# Patient Record
Sex: Male | Born: 1935 | Race: White | Hispanic: Yes | Marital: Married | Smoking: Never smoker
Health system: Southern US, Community
[De-identification: ages and names within clinical notes are randomized; demographics above are authoritative.]

## PROBLEM LIST (undated history)

## (undated) DIAGNOSIS — C801 Malignant (primary) neoplasm, unspecified: Secondary | ICD-10-CM

## (undated) DIAGNOSIS — G459 Transient cerebral ischemic attack, unspecified: Secondary | ICD-10-CM

## (undated) DIAGNOSIS — J189 Pneumonia, unspecified organism: Secondary | ICD-10-CM

## (undated) DIAGNOSIS — I4891 Unspecified atrial fibrillation: Secondary | ICD-10-CM

## (undated) DIAGNOSIS — I10 Essential (primary) hypertension: Secondary | ICD-10-CM

## (undated) DIAGNOSIS — T8859XA Other complications of anesthesia, initial encounter: Secondary | ICD-10-CM

## (undated) DIAGNOSIS — A3790 Whooping cough, unspecified species without pneumonia: Secondary | ICD-10-CM

## (undated) DIAGNOSIS — D649 Anemia, unspecified: Secondary | ICD-10-CM

## (undated) HISTORY — PX: CHOLECYSTECTOMY: SHX55

## (undated) HISTORY — PX: HERNIA REPAIR: SHX51

## (undated) HISTORY — PX: EYE SURGERY: SHX253

## (undated) HISTORY — PX: BLADDER SURGERY: SHX569

## (undated) HISTORY — PX: TONSILLECTOMY: SUR1361

---

## 2007-02-13 ENCOUNTER — Emergency Department (HOSPITAL_COMMUNITY): Admission: EM | Admit: 2007-02-13 | Discharge: 2007-02-13 | Payer: Self-pay | Admitting: Emergency Medicine

## 2007-10-06 ENCOUNTER — Emergency Department (HOSPITAL_COMMUNITY): Admission: EM | Admit: 2007-10-06 | Discharge: 2007-10-06 | Payer: Self-pay | Admitting: Emergency Medicine

## 2007-10-21 ENCOUNTER — Encounter: Payer: Self-pay | Admitting: Urology

## 2007-10-21 ENCOUNTER — Ambulatory Visit (HOSPITAL_COMMUNITY): Admission: RE | Admit: 2007-10-21 | Discharge: 2007-10-22 | Payer: Self-pay | Admitting: Urology

## 2008-10-18 IMAGING — CR DG CHEST 2V
2 series · 2 of 2 positions shown · non-contrast
Comparison: None.

CLINICAL DATA: Preop bladder tumor.  Cough. 
 CHEST ? 2 VIEW:

[w chest pa *]
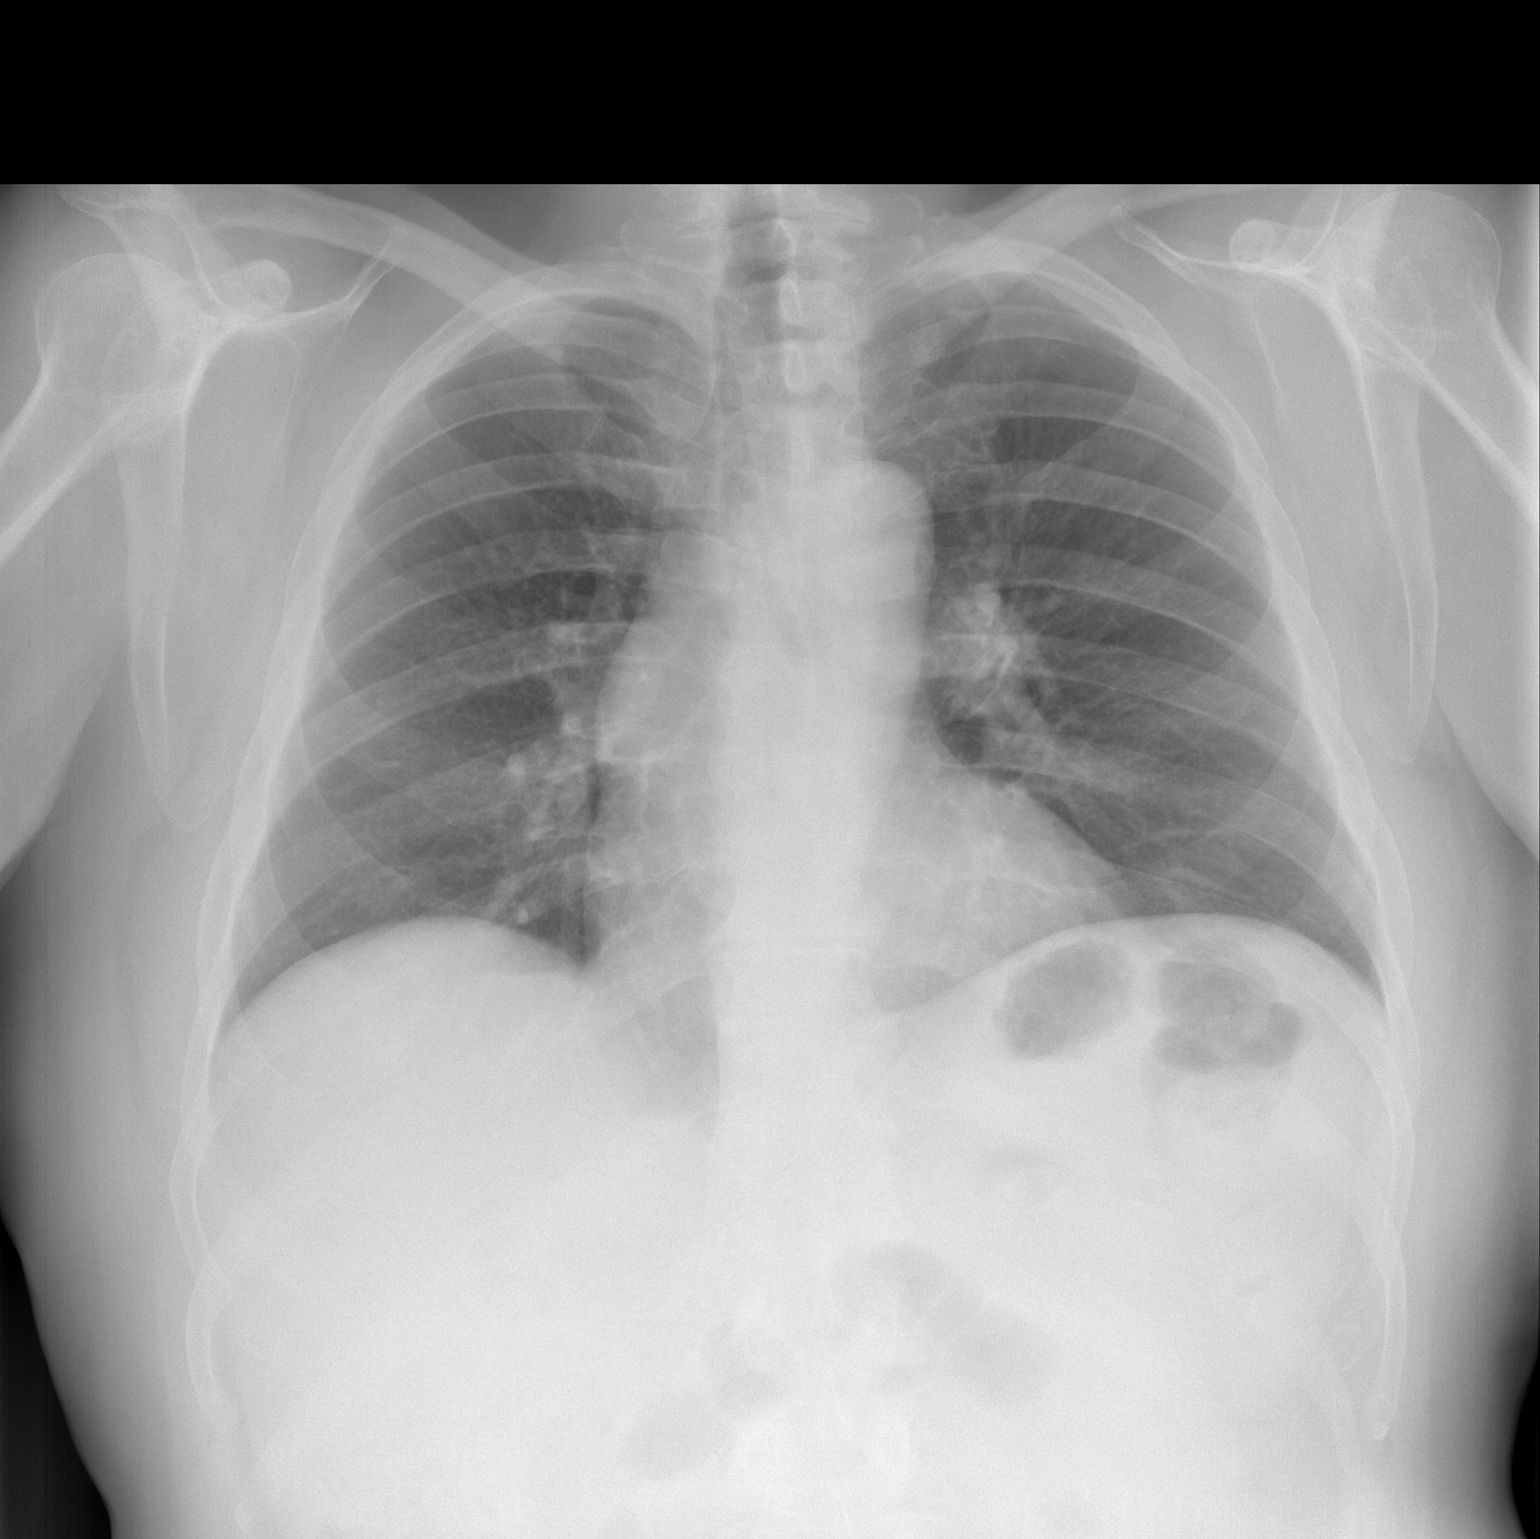

[w chest lat *]
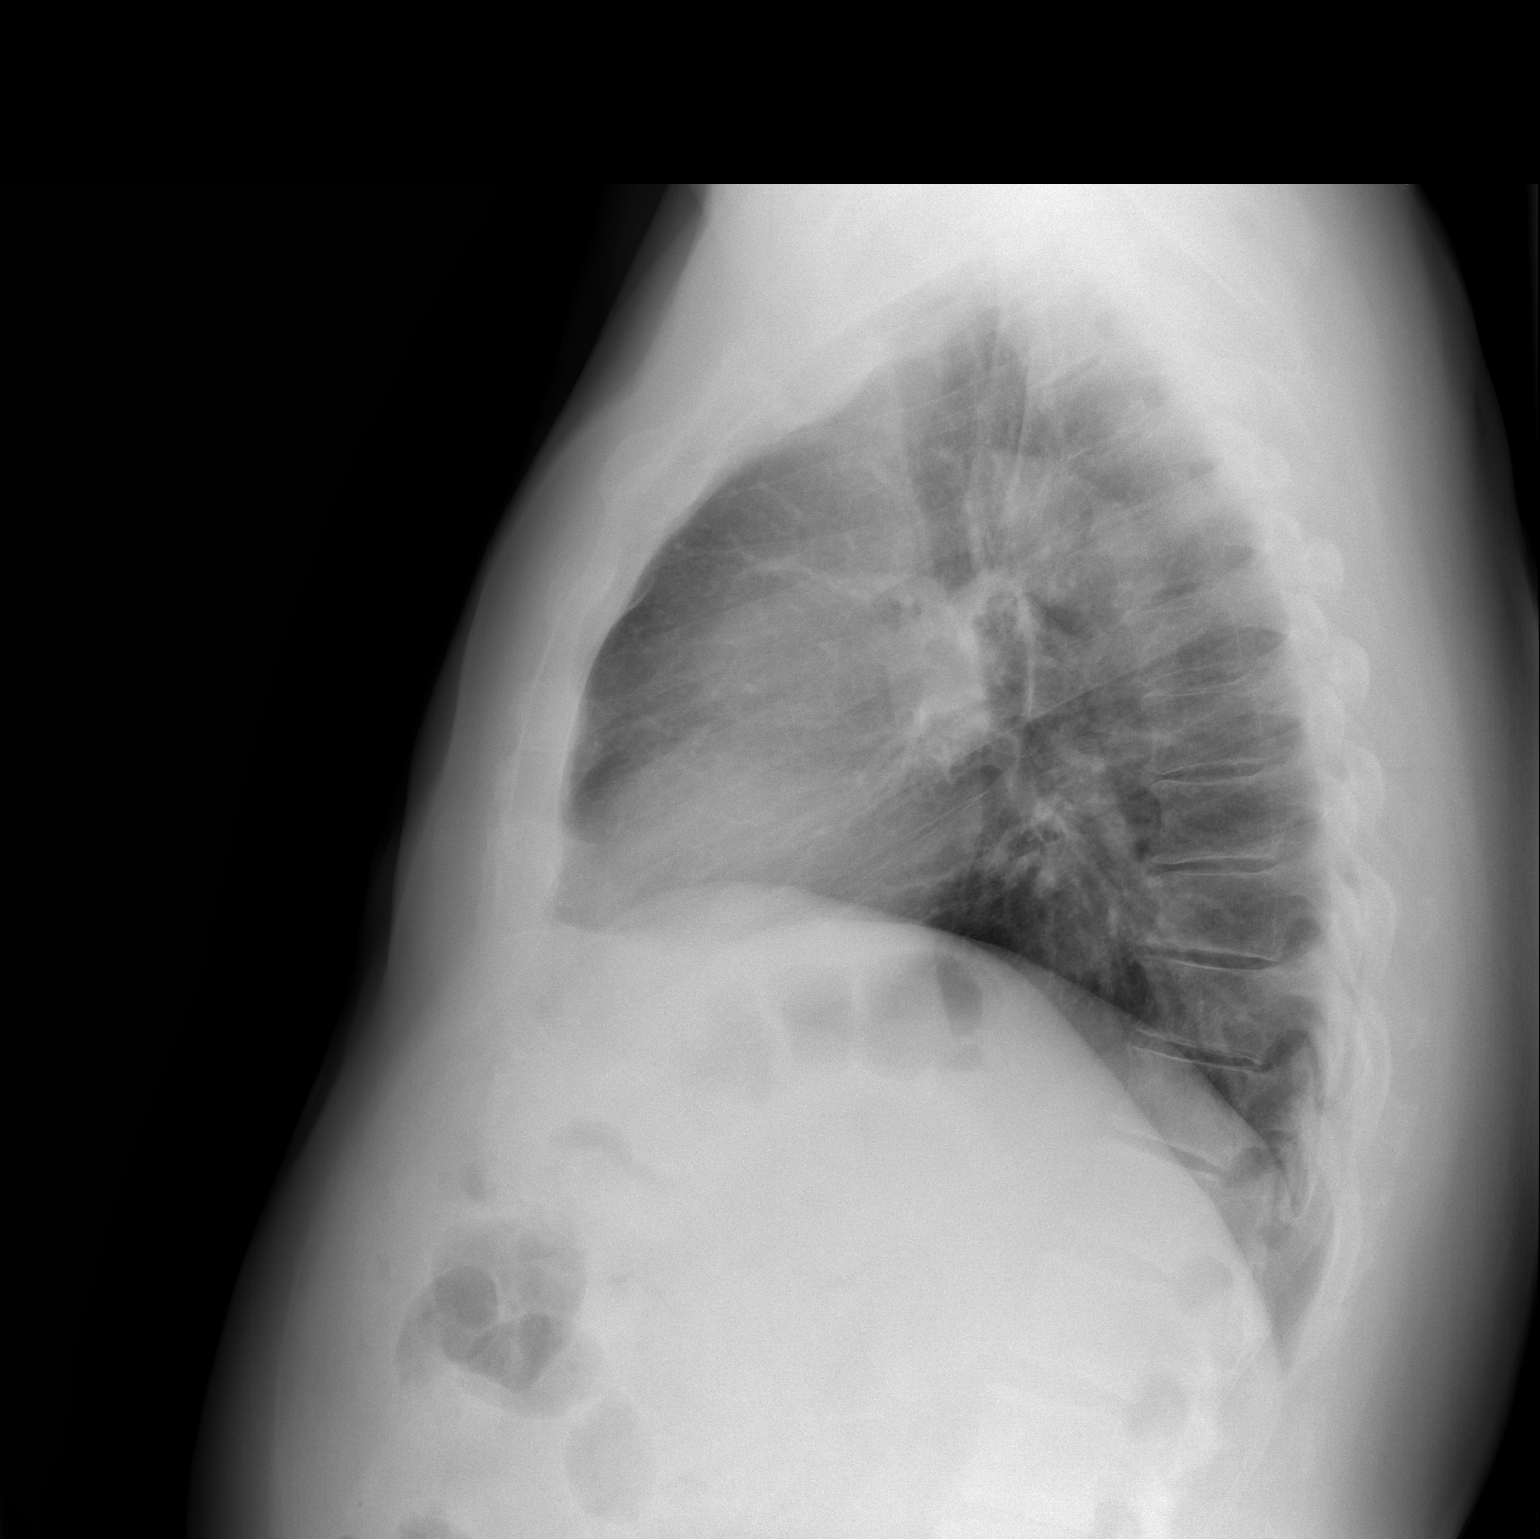

[2 of 2 positions shown; findings below may reference images not displayed]

FINDINGS: Trachea is midline.  Heart is within normal limits for size.  Lungs are low in volume with left basilar atelectasis.  No pleural fluid.
IMPRESSION: Low lung volumes with left basilar atelectasis.

## 2010-09-16 ENCOUNTER — Encounter: Payer: Self-pay | Admitting: Urology

## 2011-01-08 NOTE — Op Note (Signed)
NAMEBAYLER, Stephen Hendrix              ACCOUNT NO.:  [id number]   MEDICAL RECORD NO.:  [id number]          PATIENT TYPE:  OIB   LOCATION:  1419                         FACILITY:  [facility]   PHYSICIAN:  Bertram Millard. Dahlstedt, M.D.DATE OF BIRTH:  July 22, 1929   DATE OF PROCEDURE:  10/21/2007  DATE OF DISCHARGE:                               OPERATIVE REPORT   PREOPERATIVE DIAGNOSIS:  Bladder tumors.   POSTOPERATIVE DIAGNOSIS:  Bladder tumors.   PROCEDURE:  Anesthetic cystoscopy, transurethral resection of bladder  tumor of 2 cm tumor.   SURGEON:  Bertram Millard. Dahlstedt, M.D.   ANESTHESIA:  Subarachnoid block.   COMPLICATIONS:  None   BRIEF HISTORY:  This 77 year old gentleman recently presented to my  office with gross hematuria.  He was at first in clot retention.  Hematuria has been intermittent for a few years.  He has a history of a  TURP quite a few years ago.  Evaluation and management included clot  evacuation and eventual cystoscopy which revealed three bladder tumors -  two around his right ureteral orifice and one in the mid trigone in the  anteureteric ridge.  He presents at this time for anesthetic cystoscopy  and TURBT.  The risks and complications of this procedure have been  discussed with the patient.  He understands and desires to proceed.   DESCRIPTION OF PROCEDURE:  The patient was identified in the holding  area.  He was administered preoperative IV antibiotics.  He was taken to  the operating room where a subarachnoid block was performed.  He was  then placed in the dorsal lithotomy position.  His genitalia and  perineum were prepped and draped.  A time out was then called.  The  patient was identified, the procedure and position were identified as  well as the patient's operative position.  His urethra was then  calibrated with a 30-French with [employer] sounds.  A 28-French  resectoscope sheath was then placed through the obturator.  The  resectoscope element was  then placed.  Inspection of the bladder was  carried out directly.  The dome, anterior, and lateral walls of the  bladder were free of tumor.  There was one 5 mm tumor in the  interureteric ridge and two bladder tumors, one approximately 1.5 cm and  one smaller than this just lateral and posterior to the right ureteral  orifice.  The orifice was found to be normal.  The cutting loop was used  to resect all of these tumors down to the muscular layer.  They appeared  to be quite papillary and superficial.  Following resection of these  tumors, the base of the tumors were then coagulated with the cautery  current.  No bleeding was seen.  The bladder tumors were then evacuated  from the bladder.  Inspection of the bladder again revealed no bleeding  and no more tumors.  At this point, the scope was removed and a 22  [place]  Foley catheter placed using the stylet.  Clear urine was seen.  The  balloon was filled with 10 mL of water.  This was  hooked to dependent  drainage.  At this point, the patient was put on the stretcher and taken  to the PACU.  He tolerated procedure well.      Bertram Millard. Dahlstedt, M.D.  Electronically Signed     SMD/MEDQ  D:  10/21/2007  T:  10/21/2007  Job:  [id number]

## 2011-01-17 ENCOUNTER — Ambulatory Visit: Payer: Self-pay | Admitting: Pain Medicine

## 2011-02-14 ENCOUNTER — Ambulatory Visit: Payer: Self-pay

## 2011-05-17 LAB — I-STAT 8, (EC8 V) (CONVERTED LAB)
Acid-base deficit: 3 — ABNORMAL HIGH
BUN: 23
Chloride: 103
HCT: 50
Operator id: 277751
Potassium: 4.6
pCO2, Ven: 38.6 — ABNORMAL LOW

## 2011-05-17 LAB — POCT I-STAT CREATININE
Creatinine, Ser: 1.2
Operator id: 277751

## 2011-05-17 LAB — DIFFERENTIAL
Basophils Relative: 1
Eosinophils Absolute: 0.2
Lymphs Abs: 2
Monocytes Absolute: 0.8
Monocytes Relative: 8
Neutro Abs: 7.6
Neutrophils Relative %: 71

## 2011-05-17 LAB — URINALYSIS, ROUTINE W REFLEX MICROSCOPIC
Nitrite: POSITIVE — AB
Specific Gravity, Urine: 1.012
Urobilinogen, UA: 0.2
pH: 5

## 2011-05-17 LAB — PROTIME-INR: INR: 1

## 2011-05-17 LAB — CBC
Hemoglobin: 16.8
MCHC: 34.6
MCV: 92
RBC: 5.27
WBC: 10.7 — ABNORMAL HIGH

## 2011-05-17 LAB — URINE MICROSCOPIC-ADD ON

## 2011-06-12 LAB — POCT CARDIAC MARKERS
CKMB, poc: 1.6
Operator id: 4661
Troponin i, poc: <0.05
Troponin i, poc: <0.05

## 2011-06-12 LAB — DIFFERENTIAL
Basophils Absolute: 0
Basophils Relative: 0
Eosinophils Absolute: 0.2
Eosinophils Relative: 2
Lymphocytes Relative: 15
Lymphs Abs: 1.5
Monocytes Absolute: 0.8 — ABNORMAL HIGH
Monocytes Relative: 8
Neutro Abs: 7.5
Neutrophils Relative %: 75

## 2011-06-12 LAB — BASIC METABOLIC PANEL
BUN: 32 — ABNORMAL HIGH
CO2: 27
Calcium: 9.3
Chloride: 106
Creatinine, Ser: 1.5
GFR calc Af Amer: 55 — ABNORMAL LOW
GFR calc non Af Amer: 46 — ABNORMAL LOW
Glucose, Bld: 101 — ABNORMAL HIGH
Potassium: 4.9
Sodium: 140

## 2011-06-12 LAB — URINALYSIS, ROUTINE W REFLEX MICROSCOPIC
Bilirubin Urine: NEGATIVE
Hgb urine dipstick: NEGATIVE
Ketones, ur: NEGATIVE
Nitrite: NEGATIVE
Protein, ur: NEGATIVE
Urobilinogen, UA: 1

## 2011-06-12 LAB — CBC
HCT: 45.2
Hemoglobin: 15.6
MCHC: 34.5
MCV: 91.4
Platelets: 226
RBC: 4.95
RDW: 13.4
WBC: 10

## 2012-03-23 ENCOUNTER — Encounter (HOSPITAL_BASED_OUTPATIENT_CLINIC_OR_DEPARTMENT_OTHER): Payer: Self-pay | Admitting: *Deleted

## 2012-03-23 ENCOUNTER — Emergency Department (HOSPITAL_BASED_OUTPATIENT_CLINIC_OR_DEPARTMENT_OTHER): Payer: Medicare PPO

## 2012-03-23 ENCOUNTER — Emergency Department (HOSPITAL_BASED_OUTPATIENT_CLINIC_OR_DEPARTMENT_OTHER)
Admission: EM | Admit: 2012-03-23 | Discharge: 2012-03-23 | Disposition: A | Discharge status: Home / Self Care | Payer: Medicare PPO | Attending: Emergency Medicine | Admitting: Emergency Medicine

## 2012-03-23 DIAGNOSIS — S93409A Sprain of unspecified ligament of unspecified ankle, initial encounter: Secondary | ICD-10-CM | POA: Insufficient documentation

## 2012-03-23 DIAGNOSIS — I10 Essential (primary) hypertension: Secondary | ICD-10-CM | POA: Insufficient documentation

## 2012-03-23 DIAGNOSIS — X500XXA Overexertion from strenuous movement or load, initial encounter: Secondary | ICD-10-CM | POA: Insufficient documentation

## 2012-03-23 DIAGNOSIS — I4891 Unspecified atrial fibrillation: Secondary | ICD-10-CM | POA: Insufficient documentation

## 2012-03-23 HISTORY — DX: Essential (primary) hypertension: I10

## 2012-03-23 HISTORY — DX: Whooping cough, unspecified species without pneumonia: A37.90

## 2012-03-23 HISTORY — DX: Pneumonia, unspecified organism: J18.9

## 2012-03-23 HISTORY — DX: Unspecified atrial fibrillation: I48.91

## 2012-03-23 MED ORDER — HYDROCODONE-ACETAMINOPHEN 5-500 MG PO TABS: 1.0000 | ORAL_TABLET | Freq: Four times a day (QID) | ORAL | Status: AC | PRN

## 2012-03-23 NOTE — ED Provider Notes (Signed)
History     CSN: [id number]  Arrival date & time 03/23/12  1330   First MD Initiated Contact with Patient 03/23/12 1349      Chief Complaint  Patient presents with  . Ankle Pain    (Consider location/radiation/quality/duration/timing/severity/associated sxs/prior treatment) HPI Comments: Pt states that he was walking and turned his ankle and is now having large amount of pain to the left lateral ankle:pt states that he has had multiple sprained injuries to the area  Patient is a 78 y.o. male presenting with ankle pain. The history is provided by the patient. No language interpreter was used.  Ankle Pain  The incident occurred less than 1 hour ago. The injury mechanism was torsion. The pain is present in the left ankle. The quality of the pain is described as aching. The pain is moderate. The pain has been constant since onset. Pertinent negatives include no numbness and no inability to bear weight.    Past Medical History  Diagnosis Date  . Hypertension   . Whooping cough   . Atrial fibrillation   . Pneumonia     Past Surgical History  Procedure Date  . Tonsillectomy     History reviewed. No pertinent family history.  History  Substance Use Topics  . Smoking status: Never Smoker   . Smokeless tobacco: Not on file  . Alcohol Use: 1.2 oz/week    2 Glasses of wine per week      Review of Systems  Constitutional: Negative.   Respiratory: Negative.   Cardiovascular: Negative.   Neurological: Negative for numbness.    Allergies  Penicillins  Home Medications  No current outpatient prescriptions on file.  BP 153/63  Pulse 71  Temp 98.4 F (36.9 C)  Resp 18  Ht 5\' 8"  (1.727 m)  Wt 160 lb (72.576 kg)  BMI 24.33 kg/m2  SpO2 98%  Physical Exam  Nursing note and vitals reviewed. Constitutional: He is oriented to person, place, and time. He appears well-developed and well-nourished.  HENT:  Head: Normocephalic and atraumatic.  Cardiovascular: Normal rate  and regular rhythm.   Pulmonary/Chest: Effort normal and breath sounds normal.  Musculoskeletal:       Pt has swelling and tenderness to the left lateral ankle:pt has full rom  Neurological: He is alert and oriented to person, place, and time.  Skin: Skin is warm and dry.    ED Course  Procedures (including critical care time)  Labs Reviewed - No data to display Dg Ankle Complete Left  03/23/2012  *RADIOLOGY REPORT*  Clinical Data: Ankle pain.  LEFT ANKLE COMPLETE - 3+ VIEW  Comparison: None.  Findings: Well corticated bone fragments noted along the medial malleolus, likely related to old injury.  No definite acute fracture.  Small plantar calcaneal spur.  IMPRESSION: Probable old fractures off the medial malleolus.  No definite acute fracture.  Original Report Authenticated By: Cyndie Chime, M.D.     1. Ankle sprain       MDM  No acute injury:pt placed in a splint:pt given something for pain        Teressa Lower, NP 03/23/12 1621

## 2012-03-23 NOTE — ED Notes (Signed)
Pt c/o left ankle pain w/o injury x 1 hr ago

## 2012-03-29 NOTE — ED Provider Notes (Signed)
Medical screening examination/treatment/procedure(s) were performed by non-physician practitioner and as supervising physician I was immediately available for consultation/collaboration.  Cyndra Numbers, MD 03/29/12 [phone]

## 2013-06-03 ENCOUNTER — Inpatient Hospital Stay (HOSPITAL_COMMUNITY): Payer: Medicare PPO

## 2013-06-03 ENCOUNTER — Emergency Department (HOSPITAL_COMMUNITY): Payer: Medicare PPO

## 2013-06-03 ENCOUNTER — Encounter (HOSPITAL_COMMUNITY): Payer: Self-pay | Admitting: Emergency Medicine

## 2013-06-03 ENCOUNTER — Inpatient Hospital Stay (HOSPITAL_COMMUNITY)
Admission: EM | Admit: 2013-06-03 | Discharge: 2013-06-04 | DRG: 069 | Disposition: A | Discharge status: Home / Self Care | Payer: Medicare PPO | Attending: Internal Medicine | Admitting: Internal Medicine

## 2013-06-03 DIAGNOSIS — I6350 Cerebral infarction due to unspecified occlusion or stenosis of unspecified cerebral artery: Secondary | ICD-10-CM

## 2013-06-03 DIAGNOSIS — I10 Essential (primary) hypertension: Secondary | ICD-10-CM

## 2013-06-03 DIAGNOSIS — G459 Transient cerebral ischemic attack, unspecified: Secondary | ICD-10-CM

## 2013-06-03 DIAGNOSIS — G458 Other transient cerebral ischemic attacks and related syndromes: Principal | ICD-10-CM | POA: Diagnosis present

## 2013-06-03 DIAGNOSIS — H547 Unspecified visual loss: Secondary | ICD-10-CM

## 2013-06-03 DIAGNOSIS — Z880 Allergy status to penicillin: Secondary | ICD-10-CM

## 2013-06-03 DIAGNOSIS — R519 Headache, unspecified: Secondary | ICD-10-CM

## 2013-06-03 DIAGNOSIS — H5460 Unqualified visual loss, one eye, unspecified: Secondary | ICD-10-CM | POA: Diagnosis present

## 2013-06-03 DIAGNOSIS — Z79899 Other long term (current) drug therapy: Secondary | ICD-10-CM

## 2013-06-03 DIAGNOSIS — E785 Hyperlipidemia, unspecified: Secondary | ICD-10-CM

## 2013-06-03 DIAGNOSIS — I5032 Chronic diastolic (congestive) heart failure: Secondary | ICD-10-CM

## 2013-06-03 DIAGNOSIS — R51 Headache: Secondary | ICD-10-CM

## 2013-06-03 DIAGNOSIS — Z7982 Long term (current) use of aspirin: Secondary | ICD-10-CM

## 2013-06-03 DIAGNOSIS — I639 Cerebral infarction, unspecified: Secondary | ICD-10-CM

## 2013-06-03 DIAGNOSIS — I509 Heart failure, unspecified: Secondary | ICD-10-CM | POA: Diagnosis present

## 2013-06-03 DIAGNOSIS — Z8249 Family history of ischemic heart disease and other diseases of the circulatory system: Secondary | ICD-10-CM

## 2013-06-03 DIAGNOSIS — Z800 Family history of malignant neoplasm of digestive organs: Secondary | ICD-10-CM

## 2013-06-03 DIAGNOSIS — I4891 Unspecified atrial fibrillation: Secondary | ICD-10-CM

## 2013-06-03 DIAGNOSIS — H539 Unspecified visual disturbance: Secondary | ICD-10-CM

## 2013-06-03 DIAGNOSIS — Z8551 Personal history of malignant neoplasm of bladder: Secondary | ICD-10-CM

## 2013-06-03 LAB — DIFFERENTIAL
Basophils Absolute: 0.1 10*3/uL (ref 0.0–0.1)
Basophils Relative: 1 % (ref 0–1)
Eosinophils Absolute: 0.2 10*3/uL (ref 0.0–0.7)
Lymphocytes Relative: 25 % (ref 12–46)
Monocytes Absolute: 0.7 10*3/uL (ref 0.1–1.0)
Neutro Abs: 5.6 10*3/uL (ref 1.7–7.7)
Neutrophils Relative %: 64 % (ref 43–77)

## 2013-06-03 LAB — CBC
Hemoglobin: 16.2 g/dL (ref 13.0–17.0)
MCHC: 35.3 g/dL (ref 30.0–36.0)
RDW: 13.7 % (ref 11.5–15.5)
WBC: 8.7 10*3/uL (ref 4.0–10.5)

## 2013-06-03 LAB — TROPONIN I: Troponin I: <0.3 ng/mL (ref <0.30)

## 2013-06-03 LAB — COMPREHENSIVE METABOLIC PANEL
Albumin: 4.3 g/dL (ref 3.5–5.2)
BUN: 19 mg/dL (ref 6–23)
Calcium: 9.4 mg/dL (ref 8.4–10.5)
Chloride: 100 mEq/L (ref 96–112)
Creatinine, Ser: 0.94 mg/dL (ref 0.50–1.35)
GFR calc Af Amer: 88 mL/min — ABNORMAL LOW (ref >90)
GFR calc non Af Amer: 76 mL/min — ABNORMAL LOW (ref >90)
Glucose, Bld: 193 mg/dL — ABNORMAL HIGH (ref 70–99)
Total Bilirubin: 0.8 mg/dL (ref 0.3–1.2)

## 2013-06-03 LAB — URINALYSIS, ROUTINE W REFLEX MICROSCOPIC
Bilirubin Urine: NEGATIVE
Glucose, UA: NEGATIVE mg/dL
Hgb urine dipstick: NEGATIVE
Nitrite: NEGATIVE
Specific Gravity, Urine: 1.016 (ref 1.005–1.030)
pH: 7 (ref 5.0–8.0)

## 2013-06-03 LAB — ETHANOL: Alcohol, Ethyl (B): <11 mg/dL (ref 0–11)

## 2013-06-03 LAB — PROTIME-INR
INR: 0.99 (ref 0.00–1.49)
Prothrombin Time: 12.9 seconds (ref 11.6–15.2)

## 2013-06-03 LAB — C-REACTIVE PROTEIN: CRP: <0.5 mg/dL — ABNORMAL LOW (ref <0.60)

## 2013-06-03 LAB — CBC WITH DIFFERENTIAL/PLATELET
Basophils Relative: 0 % (ref 0–1)
Lymphocytes Relative: 3 % — ABNORMAL LOW (ref 12–46)
Lymphs Abs: 0.4 10*3/uL — ABNORMAL LOW (ref 0.7–4.0)
MCV: 91.3 fL (ref 78.0–100.0)
Neutrophils Relative %: 96 % — ABNORMAL HIGH (ref 43–77)
Platelets: 199 10*3/uL (ref 150–400)
RBC: 5.42 MIL/uL (ref 4.22–5.81)
WBC: 13.1 10*3/uL — ABNORMAL HIGH (ref 4.0–10.5)

## 2013-06-03 LAB — BASIC METABOLIC PANEL
GFR calc Af Amer: 87 mL/min — ABNORMAL LOW (ref >90)
GFR calc non Af Amer: 75 mL/min — ABNORMAL LOW (ref >90)
Potassium: 3.8 mEq/L (ref 3.5–5.1)
Sodium: 142 mEq/L (ref 135–145)

## 2013-06-03 LAB — RAPID URINE DRUG SCREEN, HOSP PERFORMED
Barbiturates: NOT DETECTED
Cocaine: NOT DETECTED
Opiates: NOT DETECTED

## 2013-06-03 LAB — POCT I-STAT, CHEM 8
BUN: 24 mg/dL — ABNORMAL HIGH (ref 6–23)
Chloride: 105 mEq/L (ref 96–112)
Glucose, Bld: 142 mg/dL — ABNORMAL HIGH (ref 70–99)
HCT: 48 % (ref 39.0–52.0)
Potassium: 3.4 mEq/L — ABNORMAL LOW (ref 3.5–5.1)

## 2013-06-03 LAB — SEDIMENTATION RATE: Sed Rate: 1 mm/hr (ref 0–16)

## 2013-06-03 LAB — GLUCOSE, CAPILLARY: Glucose-Capillary: 151 mg/dL — ABNORMAL HIGH (ref 70–99)

## 2013-06-03 LAB — POCT I-STAT TROPONIN I: Troponin i, poc: 0 ng/mL (ref 0.00–0.08)

## 2013-06-03 LAB — TSH: TSH: 1.819 u[IU]/mL (ref 0.350–4.500)

## 2013-06-03 LAB — APTT: aPTT: 28 seconds (ref 24–37)

## 2013-06-03 MED ORDER — METHYLPREDNISOLONE SODIUM SUCC 125 MG IJ SOLR
60.0000 mg | Freq: Once | INTRAMUSCULAR | Status: DC
  Filled 2013-06-03: qty 2

## 2013-06-03 MED ORDER — LIVING BETTER WITH HEART FAILURE BOOK
Freq: Once | Status: AC
  Administered 2013-06-03: 14:00:00
  Filled 2013-06-03: qty 1

## 2013-06-03 MED ORDER — LORAZEPAM 2 MG/ML IJ SOLN
INTRAMUSCULAR | Status: AC
  Administered 2013-06-03: 1 mg via INTRAVENOUS
  Filled 2013-06-03: qty 1

## 2013-06-03 MED ORDER — ONDANSETRON HCL 4 MG/2ML IJ SOLN: 4.0000 mg | Freq: Three times a day (TID) | INTRAMUSCULAR | Status: DC | PRN

## 2013-06-03 MED ORDER — ENOXAPARIN SODIUM 40 MG/0.4ML ~~LOC~~ SOLN
40.0000 mg | Freq: Every day | SUBCUTANEOUS | Status: DC
  Filled 2013-06-03 (×2): qty 0.4

## 2013-06-03 MED ORDER — LOSARTAN POTASSIUM 50 MG PO TABS
50.0000 mg | ORAL_TABLET | Freq: Every day | ORAL | Status: DC
  Administered 2013-06-03: 50 mg via ORAL
  Filled 2013-06-03 (×2): qty 1

## 2013-06-03 MED ORDER — ACETAMINOPHEN 650 MG RE SUPP: 650.0000 mg | RECTAL | Status: DC | PRN

## 2013-06-03 MED ORDER — ACETAMINOPHEN 325 MG PO TABS: 650.0000 mg | ORAL_TABLET | ORAL | Status: DC | PRN

## 2013-06-03 MED ORDER — AMIODARONE HCL 200 MG PO TABS
200.0000 mg | ORAL_TABLET | Freq: Every day | ORAL | Status: DC
  Administered 2013-06-03 – 2013-06-04 (×2): 200 mg via ORAL
  Filled 2013-06-03 (×2): qty 1

## 2013-06-03 MED ORDER — ATORVASTATIN CALCIUM 20 MG PO TABS
20.0000 mg | ORAL_TABLET | Freq: Every day | ORAL | Status: DC
  Filled 2013-06-03: qty 1

## 2013-06-03 MED ORDER — ASPIRIN 300 MG RE SUPP
300.0000 mg | Freq: Every day | RECTAL | Status: DC
  Filled 2013-06-03 (×2): qty 1

## 2013-06-03 MED ORDER — ASPIRIN EC 81 MG PO TBEC: 81.0000 mg | DELAYED_RELEASE_TABLET | Freq: Every day | ORAL | Status: DC

## 2013-06-03 MED ORDER — PREDNISONE 50 MG PO TABS
60.0000 mg | ORAL_TABLET | Freq: Every day | ORAL | Status: DC
  Filled 2013-06-03 (×2): qty 1
  Filled 2013-06-03: qty 3
  Filled 2013-06-03: qty 1

## 2013-06-03 MED ORDER — AMLODIPINE BESYLATE 5 MG PO TABS
5.0000 mg | ORAL_TABLET | Freq: Every day | ORAL | Status: DC
  Administered 2013-06-03 – 2013-06-04 (×2): 5 mg via ORAL
  Filled 2013-06-03 (×2): qty 1

## 2013-06-03 MED ORDER — SPIRONOLACTONE 25 MG PO TABS
25.0000 mg | ORAL_TABLET | Freq: Every day | ORAL | Status: DC
  Filled 2013-06-03 (×2): qty 1

## 2013-06-03 MED ORDER — LORAZEPAM 2 MG/ML IJ SOLN
1.0000 mg | Freq: Once | INTRAMUSCULAR | Status: AC
  Administered 2013-06-03: 1 mg via INTRAVENOUS

## 2013-06-03 MED ORDER — ASPIRIN 325 MG PO TABS
325.0000 mg | ORAL_TABLET | Freq: Every day | ORAL | Status: DC
  Administered 2013-06-03 – 2013-06-04 (×2): 325 mg via ORAL
  Filled 2013-06-03 (×2): qty 1

## 2013-06-03 MED ORDER — SENNOSIDES-DOCUSATE SODIUM 8.6-50 MG PO TABS
1.0000 | ORAL_TABLET | Freq: Every evening | ORAL | Status: DC | PRN
  Filled 2013-06-03: qty 1

## 2013-06-03 MED ORDER — ATORVASTATIN CALCIUM 20 MG PO TABS
20.0000 mg | ORAL_TABLET | Freq: Every day | ORAL | Status: DC
  Administered 2013-06-03: 20 mg via ORAL
  Filled 2013-06-03 (×2): qty 1

## 2013-06-03 MED ORDER — LOSARTAN POTASSIUM 50 MG PO TABS
50.0000 mg | ORAL_TABLET | Freq: Every day | ORAL | Status: DC
  Filled 2013-06-03: qty 1

## 2013-06-03 NOTE — ED Notes (Signed)
Neurologist at bedside. 

## 2013-06-03 NOTE — ED Notes (Signed)
Spoke with Theron Arista PA, and given verbal orders

## 2013-06-03 NOTE — H&P (Signed)
[facility] History and Physical  Stephen Hendrix ZOX:[id number] DOB: 1929/07/10 DOA: 06/03/2013  Referring physician: ER physician. PCP: No primary provider on file. primary care [place] in [place]  Chief Complaint: Right eye vision loss and headache.  HPI: Stephen Hendrix is a 79 y.o. male  with history of atrial fibrillation, hypertension and hyperlipidemia presented to the ER because of sudden loss of vision loss in the right eye while watching television last midnight. Patient states that since yesterday morning when he woke up he has been having right-sided temporal headache which was like stabbing in nature and retro-orbital pain. There was no tearing of the eye and at that time patient did not have any visual loss. The pain lasted for 20 minutes and resolve after patient took ibuprofen. The pain recurred again later in the evening and resolved after he took another ibuprofen. Patient did not have any visual problems in the left eye. Later in the midline patient lost eyesight in the right eye without any headache. This is said came back later in the ER and has been coming and going. Denies any weakness in upper or lower extremities or any difficulty speaking or swallowing. Denies any chest pain or shortness of breath fever chills. CT of the head was negative for anything acute. Neurologist on-call Dr. Thad Ranger had seen the patient and at this time patient has been admitted for further workup for possible stroke and temporal arteritis among the differentials. While receiving the first dose of IV Solu-Medrol patient started getting nauseous and diaphoretic and had abdominal discomfort and the IV Solu-Medrol dose was discontinued. On exam patient presently is still having difficulty seeing with the right eye but denies any shortness of breath and his diaphoresis has resolved.  Review of Systems: As presented in the history of presenting illness, rest negative.  Past Medical History  Diagnosis  Date  . Hypertension   . Whooping cough   . Atrial fibrillation   . Pneumonia    Past Surgical History  Procedure Laterality Date  . Tonsillectomy     Social History:  reports that he has never smoked. He does not have any smokeless tobacco history on file. He reports that he drinks about 1.2 ounces of alcohol per week. He reports that he does not use illicit drugs.  home.  where does patient live--  yes. Can patient participate in ADLs?  Allergies  Allergen Reactions  . Penicillins     Family History  Problem Relation Age of Onset  . CAD Father   . Colon cancer Other       Prior to Admission medications   Medication Sig Start Date End Date Taking? Authorizing Provider  amiodarone (PACERONE) 200 MG tablet Take 200 mg by mouth daily.   Yes Historical Provider, MD  amLODipine (NORVASC) 5 MG tablet Take 5 mg by mouth daily.   Yes Historical Provider, MD  atorvastatin (LIPITOR) 20 MG tablet Take 20 mg by mouth daily.   Yes Historical Provider, MD  clopidogrel (PLAVIX) 75 MG tablet Take 75 mg by mouth 3 (three) times a week.   Yes Historical Provider, MD  ibuprofen (ADVIL,MOTRIN) 200 MG tablet Take 200 mg by mouth daily as needed for pain.   Yes Historical Provider, MD  losartan (COZAAR) 50 MG tablet Take 50 mg by mouth daily.   Yes Historical Provider, MD  spironolactone (ALDACTONE) 25 MG tablet Take 25 mg by mouth daily.   Yes Historical Provider, MD   Physical Exam: Filed Vitals:  06/03/13 0421 06/03/13 0430 06/03/13 0445 06/03/13 0515  BP:  170/72 141/52 132/66  Pulse:  85 74 72  Temp: 98 F (36.7 C)     TempSrc:      Resp:  19 15 18   Weight:      SpO2:  96% 96% 94%     General:   well-developed and nourished.  Eyes:  anicteric no pallor.  ENT: No discharge from the ears eyes nose mouth.  Neck:  no mass felt.  Cardiovascular:  S1-S2 heard.  Respiratory:  no rhonchi or crepitations.  Abdomen:  soft nontender bowel sounds present.  Skin:  no  rash.  Musculoskeletal:  no edema.  Psychiatric:  appears normal.  Neurologic:  alert awake oriented to time place and person. Moves all extremities 5 x 5. Patient is blind in the right eye. Left eye has pupil is reacting to light. Right eye has minimal reaction. No facial asymmetry. Tongue is midline.  Labs on Admission:  Basic Metabolic Panel:  Recent Labs Lab 06/03/13 0139 06/03/13 0256  NA 142 143  K 3.8 3.4*  CL 104 105  CO2 25  --   GLUCOSE 131* 142*  BUN 22 24*  CREATININE 0.99 1.30  CALCIUM 8.9  --    Liver Function Tests: No results found for this basename: AST, ALT, ALKPHOS, BILITOT, PROT, ALBUMIN,  in the last 168 hours No results found for this basename: LIPASE, AMYLASE,  in the last 168 hours No results found for this basename: AMMONIA,  in the last 168 hours CBC:  Recent Labs Lab 06/03/13 0139 06/03/13 0256  WBC 8.7  --   NEUTROABS 5.6  --   HGB 16.2 16.3  HCT 45.9 48.0  MCV 91.4  --   PLT 180  --    Cardiac Enzymes:  Recent Labs Lab 06/03/13 0144  TROPONINI <0.30    BNP (last 3 results) No results found for this basename: PROBNP,  in the last 8760 hours CBG:  Recent Labs Lab 06/03/13 0435  GLUCAP 151*    Radiological Exams on Admission: Ct Head Wo Contrast  06/03/2013   *RADIOLOGY REPORT*  Clinical Data: Headache; transient loss of vision at the right eye.  CT HEAD WITHOUT CONTRAST  Technique:  Contiguous axial images were obtained from the base of the skull through the vertex without contrast.  Comparison: CT of the head performed 02/13/2007  Findings: There is no evidence of acute infarction, mass lesion, or intra- or extra-axial hemorrhage on CT.  Scattered periventricular and subcortical white matter change likely reflects small vessel ischemic microangiopathy.  Mild chronic encephalomalacia is noted at the lateral aspect of the left occipital lobe.  The posterior fossa, including the cerebellum, brainstem and fourth ventricle, is within  normal limits.  The third and lateral ventricles, and basal ganglia are unremarkable in appearance.  No mass effect or midline shift is seen.  There is no evidence of fracture; visualized osseous structures are unremarkable in appearance.  The orbits are within normal limits. The paranasal sinuses and mastoid air cells are well-aerated.  No significant soft tissue abnormalities are seen.  IMPRESSION:  1.  No acute intracranial pathology seen on CT. 2.  Small vessel ischemic microangiopathy. 3.  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   Original Report Authenticated By: Tonia Ghent, M.D.    EKG: Independently reviewed.  normal sinus rhythm with ST depression in V2 V3 which is comparable to old  EKG.  Assessment/Plan Principal Problem:   Vision changes Active Problems:   Headache   Atrial fibrillation   HTN (hypertension)   HLD (hyperlipidemia)   1. Right eye vision loss with headache - differentials include CVA versus temporal arteritis. Sedimentation rate is pending. Patient has been placed on neurochecks. MRI/MRA brain carotid Doppler and 2-D echo has been ordered. I did notify Dr. Thad Ranger about the reaction to Solu-Medrol. Further recommendations per neurologist. 2. History of atrial fibrillation presently in sinus rhythm - not sure why patient was not on anticoagulants. Patient was only on Plavix. Further recommendations per neurologist. 3. Hypertension - continue present medications. 4. Hyperlipidemia - continue present medications. 5. History of bladder cancer.    Code Status:  full code.  Family Communication:  patient's wife at the bedside.  Disposition Plan:  admit to inpatient.    Oni Dietzman N. [facility] Pager [phone].  If 7PM-7AM, please contact night-coverage [email or web address] Password [email or web address] 06/03/2013, 5:49 AM

## 2013-06-03 NOTE — ED Notes (Signed)
Pt O2 sat 99% on 2L via [place]. Patient remains A&Ox4. Patient is now less diaphoretic and states "I am feeling better." Pt states that he was "slightly sick to his stomach for a few minutes."

## 2013-06-03 NOTE — ED Notes (Addendum)
Patient states that he has been experiencing a headache since earlier today and about an hour ago while watching TV, his lost vision in his right eye.  Patient denies any numbness, tingling on his face or extremities. Negative for facial droop and drift.  Patient is alert and oriented x 4.  While triaging patient, he states that his vision is coming back, mostly blurry.

## 2013-06-03 NOTE — ED Notes (Signed)
IV started at 0238 on 06/03/2013 Blood labeled at 0240 at bedside on 06/03/2013 Blood marked as collected at 0244 on 06/03/2013 Blood sent to main lab by tube station at 0246 on 06/03/2013

## 2013-06-03 NOTE — ED Notes (Signed)
Dr. Kakrakandy at bedside. 

## 2013-06-03 NOTE — Progress Notes (Signed)
Patient admitted earlier this morning intermittent vision loss. Sedimentation rate normal and temporal arteritis ruled out. Negative MRI. Echocardiogram noted incidental finding of grade 1 diastolic dysfunction. All signs point to TIA. Patient has been on Plavix 3 times a week. I changed to daily aspirin and awaiting carotid Doppler report. Otherwise patient doing well. Discussed plan with patient, his wife and his son. Home tomorrow.

## 2013-06-03 NOTE — ED Provider Notes (Signed)
CSN: [id number]     Arrival date & time 06/03/13  0127 History   First MD Initiated Contact with Patient 06/03/13 0143     Chief Complaint  Patient presents with  . Loss of Vision   (Consider location/radiation/quality/duration/timing/severity/associated sxs/prior Treatment) HPI Comments: Patient states he has been having right-sided headaches have been intermittent for the past 4 days  They are gradual in onset. Improve with ibuprofen. Today while watching TV he had an episode of visual loss in his right eye. Vision was completely dark. His symptoms started to improve. He is seeing some lights and flashing colors he feels his vision is about half normal. He denies headache at this time. Denies any focal weakness, numbness or tingling. Denies any difficulty swallowing or speaking. Is a history of hypertension age of fibrillation. He is not have a Dr. in [place]. He has a doctor in [place]  The history is provided by the patient and a relative.    Past Medical History  Diagnosis Date  . Hypertension   . Whooping cough   . Atrial fibrillation   . Pneumonia    Past Surgical History  Procedure Laterality Date  . Tonsillectomy     Family History  Problem Relation Age of Onset  . CAD Father   . Colon cancer Other    History  Substance Use Topics  . Smoking status: Never Smoker   . Smokeless tobacco: Not on file  . Alcohol Use: 1.2 oz/week    2 Glasses of wine per week    Review of Systems  Constitutional: Negative for fever, activity change and appetite change.  HENT: Negative for rhinorrhea.   Eyes: Positive for visual disturbance.  Respiratory: Negative for cough, chest tightness and shortness of breath.   Cardiovascular: Negative for chest pain.  Gastrointestinal: Negative for nausea, vomiting and abdominal pain.  Genitourinary: Negative for dysuria and hematuria.  Musculoskeletal: Negative for back pain.  Skin: Negative for rash.  Neurological: Positive for  light-headedness and headaches. Negative for dizziness and weakness.  A complete 10 system review of systems was obtained and all systems are negative except as noted in the HPI and PMH.    Allergies  Penicillins  Home Medications   No current outpatient prescriptions on file. BP 134/64  Pulse 77  Temp(Src) 98.8 F (37.1 C) (Oral)  Resp 18  Ht 5\' 8"  (1.727 m)  Wt 174 lb 6.4 oz (79.107 kg)  BMI 26.52 kg/m2  SpO2 98% Physical Exam  Constitutional: He is oriented to person, place, and time. He appears well-developed and well-nourished. No distress.  HENT:  Head: Normocephalic and atraumatic.  Mouth/Throat: Oropharynx is clear and moist. No oropharyngeal exudate.  No temporal artery tenderness  Eyes: Conjunctivae and EOM are normal. Pupils are equal, round, and reactive to light.  Neck: Normal range of motion. Neck supple.  Cardiovascular: Normal rate, regular rhythm and normal heart sounds.   No murmur heard. Pulmonary/Chest: Effort normal and breath sounds normal. No respiratory distress.  Abdominal: Soft. There is no tenderness. There is no rebound and no guarding.  Musculoskeletal: Normal range of motion. He exhibits no edema and no tenderness.  Neurological: He is alert and oriented to person, place, and time. No cranial nerve deficit. He exhibits normal muscle tone. Coordination normal.  CN 2-12 intact, no ataxia on finger to nose, no nystagmus, 5/5 strength throughout, no pronator drift, Romberg negative, normal gait. Visual fields full to confrontation.   Skin: Skin is warm.  ED Course  Procedures (including critical care time) Labs Review Labs Reviewed  BASIC METABOLIC PANEL - Abnormal; Notable for the following:    Glucose, Bld 131 (*)    GFR calc non Af Amer 75 (*)    GFR calc Af Amer 87 (*)    All other components within normal limits  GLUCOSE, CAPILLARY - Abnormal; Notable for the following:    Glucose-Capillary 151 (*)    All other components within  normal limits  POCT I-STAT, CHEM 8 - Abnormal; Notable for the following:    Potassium 3.4 (*)    BUN 24 (*)    Glucose, Bld 142 (*)    All other components within normal limits  CBC  ETHANOL  PROTIME-INR  APTT  DIFFERENTIAL  TROPONIN I  URINE RAPID DRUG SCREEN (HOSP PERFORMED)  URINALYSIS, ROUTINE W REFLEX MICROSCOPIC  SEDIMENTATION RATE  TROPONIN I  C-REACTIVE PROTEIN  COMPREHENSIVE METABOLIC PANEL  CBC WITH DIFFERENTIAL  TSH  CBC  POCT I-STAT TROPONIN I   Imaging Review Ct Head Wo Contrast  06/03/2013   *RADIOLOGY REPORT*  Clinical Data: Headache; transient loss of vision at the right eye.  CT HEAD WITHOUT CONTRAST  Technique:  Contiguous axial images were obtained from the base of the skull through the vertex without contrast.  Comparison: CT of the head performed 02/13/2007  Findings: There is no evidence of acute infarction, mass lesion, or intra- or extra-axial hemorrhage on CT.  Scattered periventricular and subcortical white matter change likely reflects small vessel ischemic microangiopathy.  Mild chronic encephalomalacia is noted at the lateral aspect of the left occipital lobe.  The posterior fossa, including the cerebellum, brainstem and fourth ventricle, is within normal limits.  The third and lateral ventricles, and basal ganglia are unremarkable in appearance.  No mass effect or midline shift is seen.  There is no evidence of fracture; visualized osseous structures are unremarkable in appearance.  The orbits are within normal limits. The paranasal sinuses and mastoid air cells are well-aerated.  No significant soft tissue abnormalities are seen.  IMPRESSION:  1.  No acute intracranial pathology seen on CT. 2.  Small vessel ischemic microangiopathy. 3.  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   Original Report Authenticated By: Tonia Ghent, M.D.    MDM   1. CVA (cerebral infarction)   2. Visual loss    3. Atrial fibrillation   4. Headache   5. HTN (hypertension)   6. Vision changes    Transient visual loss concern for TIA. Code stroke not called as symptoms are rapidly improving. No temporal artery tenderness.  CT head negative for acute pathology. Patient has had intermittent headaches for the past several days and right temporal distribution, but he is not having one at this time.   Discussed with Dr. Thad Ranger of neurology. She is concerned that patient may have temporal arteritis. Will start high-dose steroids as well as proceed with CVA workup. Patient had some diaphoresis and nausea with solu-medrol administration.  EKG unchanged and CBG 151. Symptoms resolved and solu-medrol discontinued.   Date: 06/03/2013  Rate: 80  Rhythm: normal sinus rhythm  QRS Axis: left  Intervals: normal  ST/T Wave abnormalities: nonspecific ST/T changes  Conduction Disutrbances:none  Narrative Interpretation: ST depressions anteriorly  Old EKG Reviewed: unchanged   Date: 06/03/2013  Rate: 63  Rhythm: normal sinus rhythm  QRS Axis: left  Intervals: normal  ST/T Wave abnormalities: ST depressions anteriorly  Conduction Disutrbances:none  Narrative Interpretation:   Old EKG Reviewed: unchanged    Glynn Octave, MD 06/03/13 [phone]

## 2013-06-03 NOTE — ED Notes (Signed)
Patient was receiving Solu-medrol 60 mg when he became short of breath and diaphoretic. I stopped giving medication to make MD aware. Dr. Manus Gunning gave verbal order to stop giving medication and repeat ED EKG. Pt placed on 2L O2 via [place]. Pt remained A&Ox4 the entire time.

## 2013-06-03 NOTE — Progress Notes (Signed)
  Echocardiogram 2D Echocardiogram has been performed.  Stephen Hendrix 06/03/2013, 11:34 AM

## 2013-06-03 NOTE — Progress Notes (Signed)
*  PRELIMINARY RESULTS* Vascular Ultrasound Carotid Duplex (Doppler) has been completed.  Preliminary findings: Bilateral:  1-39% ICA stenosis.  Vertebral artery flow is antegrade.      Farrel Demark, RDMS, RVT  06/03/2013, 10:09 AM

## 2013-06-03 NOTE — Progress Notes (Signed)
PT Cancellation Note  Patient Details Name: Stephen Hendrix MRN: [id number] DOB: May 02, 1929   Cancelled Treatment:    Reason Eval/Treat Not Completed: Other (comment) (Order to start 06/04/13)   [name] 06/03/2013, 11:14 AM

## 2013-06-03 NOTE — ED Notes (Signed)
Called main lab to get update on labs sent. Stated they will result them soon.

## 2013-06-03 NOTE — ED Notes (Signed)
Phlebotomy at bedside.

## 2013-06-03 NOTE — ED Notes (Signed)
Spoke with Dr. Manus Gunning about patient.

## 2013-06-03 NOTE — ED Notes (Signed)
3 west nurse made aware that Dr. Manus Gunning wants patient to remain in ED until further notice.

## 2013-06-03 NOTE — Consult Note (Addendum)
Reason for Consult:Loss of vision in the right eye Referring Physician: Rancour  CC: Decreased vision in the right eye  HPI: Stephen Hendrix is an 79 y.o. male who reports that he has been having headaches for the past 3-4 days.  The headaches have been intermittent but have been progressively worsening.  They are over  The right eye and extend to the top of his head.  They are at times associated with what he describes as dizziness.  This dizziness is not described as vertigo but as a lightheadedness.  This evening while watching television at about 1230AM patient experienced an acute loss of vision from the right eye.  Since the onset the vision has fluctuated.  He describes it as a curtain going up and down but that the vision is no longer completely black but blurred.  The patient has had no associated numbness or weakness.  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  He has been on medical management.    Past Medical History  Diagnosis Date  . Hypertension   . Whooping cough   . Atrial fibrillation   . Pneumonia     Past Surgical History  Procedure Laterality Date  . Tonsillectomy      Family history: Mother with hypertension and father with CAD.  Both are deceased  Social History:  reports that he has never smoked. He does not have any smokeless tobacco history on file. He reports that he drinks about 1.2 ounces of alcohol per week. He reports that he does not use illicit drugs.  Allergies  Allergen Reactions  . Penicillins     Medications: Prior to Admission:  Current outpatient prescriptions: amiodarone (PACERONE) 200 MG tablet, Take 200 mg by mouth daily., Disp: , Rfl: ;   amLODipine (NORVASC) 5 MG tablet, Take 5 mg by mouth daily., Disp: , Rfl: ;   atorvastatin (LIPITOR) 20 MG tablet, Take 20 mg by mouth daily., Disp: , Rfl: ;   clopidogrel (PLAVIX) 75 MG tablet, Take 75 mg by mouth 3 (three) times  a week., Disp: , Rfl:  ibuprofen (ADVIL,MOTRIN) 200 MG tablet, Take 200 mg by mouth daily as needed for pain., Disp: , Rfl: ;   losartan (COZAAR) 50 MG tablet, Take 50 mg by mouth daily., Disp: , Rfl: ;   spironolactone (ALDACTONE) 25 MG tablet, Take 25 mg by mouth daily., Disp: , Rfl:   ROS: History obtained from the patient  General ROS: negative for - chills, fatigue, fever, night sweats, weight gain or weight loss Psychological ROS: negative for - behavioral disorder, hallucinations, memory difficulties, mood swings or suicidal ideation Ophthalmic ROS: negative for - blurry vision, double vision, eye pain or loss of vision ENT ROS: negative for - epistaxis, nasal discharge, oral lesions, sore throat, tinnitus or vertigo Allergy and Immunology ROS: negative for - hives or itchy/watery eyes Hematological and Lymphatic ROS: negative for - bleeding problems, bruising or swollen lymph nodes Endocrine ROS: negative for - galactorrhea, hair pattern changes, polydipsia/polyuria or temperature intolerance Respiratory ROS: negative for - cough, hemoptysis, shortness of breath or wheezing Cardiovascular ROS: negative for - chest pain, dyspnea on exertion, edema or irregular heartbeat Gastrointestinal ROS: negative for - abdominal pain, diarrhea, hematemesis, nausea/vomiting or stool incontinence Genito-Urinary ROS: negative for - dysuria, hematuria, incontinence or urinary frequency/urgency Musculoskeletal ROS: negative for - joint swelling or muscular weakness Neurological ROS: as noted in HPI Dermatological ROS:  negative for rash and skin lesion changes  Physical Examination: Blood pressure 132/66, pulse 72, temperature 98 F (36.7 C), temperature source Oral, resp. rate 18, weight 80.088 kg (176 lb 9 oz), SpO2 94.00%.  Neurologic Examination Mental Status: Alert, oriented, thought content appropriate.  Speech fluent without evidence of aphasia.  Able to follow 3 step commands without  difficulty. Cranial Nerves: II: Discs flat bilaterally; Visual fields grossly normal on the left.  Able to count fingers in all visual fields on the right although reports graying of vision throughout, pupils equal, round, reactive to light and accommodation III,IV, VI: ptosis not present, extra-ocular motions intact bilaterally V,VII: smile symmetric, facial light touch sensation normal bilaterally VIII: hearing normal bilaterally IX,X: gag reflex present XI: bilateral shoulder shrug XII: midline tongue extension Motor: Right : Upper extremity   5/5    Left:     Upper extremity   5/5  Lower extremity   5/5     Lower extremity   5/5 Tone and bulk:normal tone throughout; no atrophy noted Sensory: Pinprick and light touch intact throughout, bilaterally Deep Tendon Reflexes: Symmetric throughout Plantars: Right: downgoing   Left: downgoing Cerebellar: normal finger-to-nose and normal heel-to-shin test Gait: Unable to test CV: pulses palpable throughout    Laboratory Studies:   Basic Metabolic Panel:  Recent Labs Lab 06/03/13 0139 06/03/13 0256  NA 142 143  K 3.8 3.4*  CL 104 105  CO2 25  --   GLUCOSE 131* 142*  BUN 22 24*  CREATININE 0.99 1.30  CALCIUM 8.9  --     Liver Function Tests: No results found for this basename: AST, ALT, ALKPHOS, BILITOT, PROT, ALBUMIN,  in the last 168 hours No results found for this basename: LIPASE, AMYLASE,  in the last 168 hours No results found for this basename: AMMONIA,  in the last 168 hours  CBC:  Recent Labs Lab 06/03/13 0139 06/03/13 0256  WBC 8.7  --   NEUTROABS 5.6  --   HGB 16.2 16.3  HCT 45.9 48.0  MCV 91.4  --   PLT 180  --     Cardiac Enzymes:  Recent Labs Lab 06/03/13 0144  TROPONINI <0.30    BNP: No components found with this basename: POCBNP,   CBG:  Recent Labs Lab 06/03/13 0435  GLUCAP 151*    Microbiology: Results for orders placed during the hospital encounter of 10/06/07  URINE CULTURE      Status: None   Collection Time    10/06/07  5:00 PM      Result Value Range Status   Specimen Description URINE, RANDOM   Final   Special Requests ADDON FROM 1700   Final   Colony Count 5,000 COLONIES/ML   Final   Culture INSIGNIFICANT GROWTH   Final   Report Status 10/08/2007 FINAL   Final    Coagulation Studies:  Recent Labs  06/03/13 0139  LABPROT 12.9  INR 0.99    Urinalysis:  Recent Labs Lab 06/03/13 0505  COLORURINE YELLOW  LABSPEC 1.016  PHURINE 7.0  GLUCOSEU NEGATIVE  HGBUR NEGATIVE  BILIRUBINUR NEGATIVE  KETONESUR NEGATIVE  PROTEINUR NEGATIVE  UROBILINOGEN 1.0  NITRITE NEGATIVE  LEUKOCYTESUR NEGATIVE    Lipid Panel:  No results found for this basename: chol, trig, hdl, cholhdl, vldl, ldlcalc    HgbA1C:  No results found for this basename: HGBA1C    Urine Drug Screen:   No results found for this basename: labopia, cocainscrnur, labbenz, amphetmu, thcu, labbarb  Alcohol Level:  Recent Labs Lab 06/03/13 0139  ETH <11    Imaging: Ct Head Wo Contrast  06/03/2013   *RADIOLOGY REPORT*  Clinical Data: Headache; transient loss of vision at the right eye.  CT HEAD WITHOUT CONTRAST  Technique:  Contiguous axial images were obtained from the base of the skull through the vertex without contrast.  Comparison: CT of the head performed 02/13/2007  Findings: There is no evidence of acute infarction, mass lesion, or intra- or extra-axial hemorrhage on CT.  Scattered periventricular and subcortical white matter change likely reflects small vessel ischemic microangiopathy.  Mild chronic encephalomalacia is noted at the lateral aspect of the left occipital lobe.  The posterior fossa, including the cerebellum, brainstem and fourth ventricle, is within normal limits.  The third and lateral ventricles, and basal ganglia are unremarkable in appearance.  No mass effect or midline shift is seen.  There is no evidence of fracture; visualized osseous structures are  unremarkable in appearance.  The orbits are within normal limits. The paranasal sinuses and mastoid air cells are well-aerated.  No significant soft tissue abnormalities are seen.  IMPRESSION:  1.  No acute intracranial pathology seen on CT. 2.  Small vessel ischemic microangiopathy. 3.  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   Original Report Authenticated By: Tonia Ghent, M.D.     Assessment/Plan: 79 year old male presenting with decreased vision from the right eye.  Visual symptoms were preceded by headache and have fluctuated.  This makes temporal arteritis likely.  Patient does though have a history of carotid disease and atrial fibrillation.  Patient was not considered a tPA candidate secondary to improvement in symptoms at presentation.  At the time of my evaluation he is outside of the treatment window.  With TA being high on the differential as well would initiate steroids.    Recommendations: 1.  Solumedrol 60mg  IV now.  Would then start 60mg  po daily 2.  ESR, CRP 3.  MRI and MRA of the brain without contrast.  Patient is claustrophobic. 4.  Carotid dopplers 5.  Continue Plavix  Thana Farr, MD [facility] [phone] 06/03/2013, 5:30 AM

## 2013-06-04 ENCOUNTER — Inpatient Hospital Stay (HOSPITAL_COMMUNITY): Payer: Medicare PPO

## 2013-06-04 DIAGNOSIS — E785 Hyperlipidemia, unspecified: Secondary | ICD-10-CM

## 2013-06-04 DIAGNOSIS — I5032 Chronic diastolic (congestive) heart failure: Secondary | ICD-10-CM

## 2013-06-04 DIAGNOSIS — G459 Transient cerebral ischemic attack, unspecified: Secondary | ICD-10-CM | POA: Diagnosis present

## 2013-06-04 DIAGNOSIS — H539 Unspecified visual disturbance: Secondary | ICD-10-CM

## 2013-06-04 LAB — LIPID PANEL
Cholesterol: 150 mg/dL (ref 0–200)
HDL: 55 mg/dL (ref >39)
LDL Cholesterol: 82 mg/dL (ref 0–99)
Total CHOL/HDL Ratio: 2.7 RATIO
Triglycerides: 65 mg/dL (ref <150)

## 2013-06-04 LAB — HEMOGLOBIN A1C: Hgb A1c MFr Bld: 5.6 % (ref <5.7)

## 2013-06-04 MED ORDER — ATORVASTATIN CALCIUM 40 MG PO TABS: 40.0000 mg | ORAL_TABLET | Freq: Every day | ORAL | Status: DC

## 2013-06-04 MED ORDER — IOHEXOL 350 MG/ML SOLN
50.0000 mL | Freq: Once | INTRAVENOUS | Status: AC | PRN
  Administered 2013-06-04: 50 mL via INTRAVENOUS

## 2013-06-04 NOTE — Progress Notes (Signed)
Occupational Therapy Evaluation Patient Details Name: Stephen Hendrix MRN: [id number] DOB: 1928/10/02 Today's Date: 06/04/2013 Time: 3086-5784 OT Time Calculation (min): 15 min  OT Assessment / Plan / Recommendation History of present illness Pt is an 79 y/o with HTN, atrial fibrillation, known carotid disease, admitted after sustaining a transient episode of HA with complete right visual loss. Probable TIA, although the degree of eye pain is kind of unusual for a retinal TIA. His right eye vision is back to baseline which is not quite consistent with retinal detachment. No history of migraine.   Clinical Impression   Pt describes episodes as bright flashes of lights, "like fireworks" going off in his eye with a "curtain moving over his eye with total vision loss". Pt reports his vision is back to baseline. Rec follow up with opthamologist, which is being setup by case worker. Discussed warning signs/symptoms of CVA and to return to hospital is his symptoms reoccur. Pt/family verbalized understanding.    OT Assessment  Patient does not need any further OT services    Follow Up Recommendations  Other (comment) (opthamologist)    Barriers to Discharge      Equipment Recommendations  None recommended by OT    Recommendations for Other Services Other (comment) (opthamologist)  Frequency       Precautions / Restrictions Precautions Precautions: Fall Precaution Comments: low fall risk Restrictions Weight Bearing Restrictions: No   Pertinent Vitals/Pain no apparent distress     ADL  Transfers/Ambulation Related to ADLs: mod i ADL Comments: independent to mod I with all ADL    OT Diagnosis:    OT Problem List:   OT Treatment Interventions:     OT Goals(Current goals can be found in the care plan section)    Visit Information  Last OT Received On: 06/04/13 Assistance Needed: +1 History of Present Illness: Pt is an 79 y/o with HTN, atrial fibrillation, known carotid disease,  admitted after sustaining a transient episode of HA with complete right visual loss. Probable TIA, although the degree of eye pain is kind of unusual for a retinal TIA. His right eye vision is back to baseline which is not quite consistent with retinal detachment. No history of migraine.       Prior Functioning     Home Living Family/patient expects to be discharged to:: Private residence Living Arrangements: Spouse/significant other Type of Home: House Home Access: Stairs to enter Secretary/administrator of Steps: 3 Entrance Stairs-Rails: Left Home Layout: One level Home Equipment: None Additional Comments: has house in [place] and one in [place], [place]  Lives With: Spouse Prior Function Level of Independence: Independent Communication Communication: No difficulties         Vision/Perception Vision - History Baseline Vision: Other (comment) Visual History: Cataracts Patient Visual Report: Blurring of vision;Other (comment) (flashes of lights, "curtain moving across visual field") Vision - Assessment Eye Alignment: Within Functional Limits Vision Assessment: Vision tested Ocular Range of Motion: Within Functional Limits Alignment/Gaze Preference: Within Defined Limits Tracking/Visual Pursuits: Able to track stimulus in all quads without difficulty Saccades: Within functional limits Convergence: Within functional limits Visual Fields: No apparent deficits Additional Comments: Pt reports vision back to baseline Perception Perception: Within Functional Limits   Cognition  Cognition Arousal/Alertness: Awake/alert Behavior During Therapy: WFL for tasks assessed/performed Overall Cognitive Status: Within Functional Limits for tasks assessed    Extremity/Trunk Assessment Upper Extremity Assessment Upper Extremity Assessment: Overall WFL for tasks assessed Lower Extremity Assessment Lower Extremity Assessment: Overall WFL for  tasks assessed     Mobility Bed  Mobility Bed Mobility: Supine to Sit;Sitting - Scoot to Edge of Bed Supine to Sit: 7: Independent Sitting - Scoot to Edge of Bed: 7: Independent Details for Bed Mobility Assistance: pt able to perform all bed mobility in a safe and efficient manner. Transfers Sit to Stand: From bed;7: Independent Stand to Sit: To bed;7: Independent Details for Transfer Assistance: pt peformed all transfer in a safe and efficient manner.     Exercise     Balance  WFL   End of Session OT - End of Session Activity Tolerance: Patient tolerated treatment well Patient left: in bed;with call bell/phone within reach;with family/visitor present Nurse Communication: Other (comment) (need for pt to follow up with opthaqmologist)  GO     Floyd Wade,HILLARY 06/04/2013, 3:11 PM [facility], OTR/L  [phone] 06/04/2013

## 2013-06-04 NOTE — Discharge Summary (Signed)
Physician Discharge Summary  Stephen Hendrix ZOX:[id number] DOB: [birth year] DOA: 06/03/2013  PCP: No primary provider on file.  Admit date: 06/03/2013 Discharge date: 06/04/2013  Time spent: 30 minutes  Recommendations for Outpatient Follow-up:  1. Patient's Lipitor is being increased from 20 mg to 40 mg 2. Patient is receiving information on diastolic heart failure 3. Patient will stop taking Plavix 75 mg by mouth 3 times a week 4. Patient will start aspirin 81 mg by mouth daily 5. Patient will followup with ophthalmology in the next few weeks. 6. Patient has a primary care doctor in [place], [place] He will establish with one in [facility], have recommended Dr. Betty [place] of [facility].  Discharge Diagnoses:  Principal Problem:   TIA (transient ischemic attack): Patient had an or other neurological problems. Given resolution of symptoms of intermittent, concerning possibly for TIA. Patient previously had been on Plavix which was decreased to 3 times a week for what he said was carotid disease. He's never had a carotid endarterectomy. His carotid Dopplers were negative. Is unclear, however he was receiving much of his medical care back and forth between here and [place] Regardless, he was previously on aspirin but stopped taking because of bruising. Stroke workup and negative. MRI negative. CT negative. Dopplers negative. Echo noted grade 1 diastolic dysfunction. As recommendations for patient to be on aspirin 81 mg by mouth daily. Active Problems:   Vision changes: Possibly due to TIA. Did not report any eye pain. Neurology recommended ophthalmology followup. No current symptoms.   Headache    HTN (hypertension):: Patient will continue Norvasc, and Cozaar.    HLD (hyperlipidemia): Lipid panel done noted LDL of 80. Ideally, given diastolic heart failure and possible TIA, LDL should be below 70. Increase Lipitor from 20 mg to 40 mg by mouth daily.    Chronic diastolic  heart failure: Incidentally noted on echocardiogram during stroke workup. Patient has never had any previous problems. BNP was normal. Would not make any changes at this time.  History of atrial fibrillation:?. Patient on amiodarone. No episodes of atrial fibrillation while monitoring. Would not recommend at this time starting anticoagulation. If symptoms persist despite aspirin 81 mg by mouth daily, would recommend Holter monitor    Discharge Condition: Improved, being discharged home  Diet recommendation: Heart healthy  Filed Weights   06/03/13 0136 06/03/13 0649 06/04/13 0400  Weight: 80.088 kg (176 lb 9 oz) 79.107 kg (174 lb 6.4 oz) 79.606 kg (175 lb 8 oz)    History of present illness:  On the early morning 10/9: Stephen Hendrix is a 79 y.o. male with history of atrial fibrillation, hypertension and hyperlipidemia presented to the ER because of sudden loss of vision loss in the right eye while watching television last midnight. Patient states that since yesterday morning when he woke up he has been having right-sided temporal headache which was like stabbing in nature and retro-orbital pain. There was no tearing of the eye and at that time patient did not have any visual loss. The pain lasted for 20 minutes and resolve after patient took ibuprofen. The pain recurred again later in the evening and resolved after he took another ibuprofen. Patient did not have any visual problems in the left eye. Later in the midline patient lost eyesight in the right eye without any headache. This is said came back later in the ER and has been coming and going. Denies any weakness in upper or lower extremities or any difficulty  speaking or swallowing. Denies any chest pain or shortness of breath fever chills. CT of the head was negative for anything acute. Neurologist on-call Dr. Thad Ranger had seen the patient and at this time patient has been admitted for further workup for possible stroke and temporal arteritis  among the differentials. While receiving the first dose of IV Solu-Medrol patient started getting nauseous and diaphoretic and had abdominal discomfort and the IV Solu-Medrol dose was discontinued. On exam patient presently is still having difficulty seeing with the right eye but denies any shortness of breath and his diaphoresis has resolved.   Hospital Course:  Principal Problem:   TIA (transient ischemic attack) Active Problems:   Vision changes   Headache   Atrial fibrillation   HTN (hypertension)   HLD (hyperlipidemia)   Chronic diastolic heart failure   Procedures:  Echocardiogram done 10/9: Grade 1 diastolic dysfunction  Carotid Dopplers: No evidence of significant carotid artery stenosis bilaterally  Consultations:  Neurology  Discharge Exam: Filed Vitals:   06/04/13 1042  BP: 111/80  Pulse:   Temp:   Resp:     General: Alert and oriented x3, no acute distress HEENT: Normocephalic, atraumatic, mucous members are moist, no carotid bruits, cranial nerves II through XII are intact Cardiovascular: Regular rate and rhythm, S1-S2 Respiratory: Clear to auscultation bilaterally Abdomen: Soft, nontender, nondistended, positive bowel sounds : Extremities: No clubbing or cyanosis or edema  Discharge Instructions  Discharge Orders   Future Orders Complete By Expires   Diet - low sodium heart healthy  As directed    Increase activity slowly  As directed        Medication List    STOP taking these medications       clopidogrel 75 MG tablet  Commonly known as:  PLAVIX      TAKE these medications       amiodarone 200 MG tablet  Commonly known as:  PACERONE  Take 200 mg by mouth daily.     amLODipine 5 MG tablet  Commonly known as:  NORVASC  Take 5 mg by mouth daily.     aspirin EC 81 MG tablet  Take 1 tablet (81 mg total) by mouth daily.     atorvastatin 40 MG tablet  Commonly known as:  LIPITOR  Take 1 tablet (40 mg total) by mouth daily.      eplerenone 25 MG tablet  Commonly known as:  INSPRA  Take 25 mg by mouth daily.     ibuprofen 200 MG tablet  Commonly known as:  ADVIL,MOTRIN  Take 200 mg by mouth daily as needed for pain.     losartan 50 MG tablet  Commonly known as:  COZAAR  Take 50 mg by mouth daily.       Allergies  Allergen Reactions  . Methylprednisolone Shortness Of Breath    Pt tolerates po steroids  . Penicillins        Follow-up Information   Schedule an appointment as soon as possible for a visit with [place], Timoteo Expose, MD. (New primary doctor)    Specialty:  Family Medicine   Contact information:   [address] [place] [address] [phone]       Follow up with Antony Contras, MD. Schedule an appointment as soon as possible for a visit in 1 month. [facility] doctor)    Specialty:  Ophthalmology   Contact information:   [address] [place] [address] [phone]  The results of significant diagnostics from this hospitalization (including imaging, microbiology, ancillary and laboratory) are listed below for reference.    Significant Diagnostic Studies: Dg Chest 2 View  06/03/2013   CLINICAL DATA:  Facial pain  EXAM: CHEST  2 VIEW  COMPARISON:  2/25  FINDINGS: The heart size and mediastinal contours are within normal limits. Both lungs are clear. The visualized skeletal structures are unremarkable.  IMPRESSION: No acute abnormality noted.   Electronically Signed   By: Alcide Clever M.D.   On: 06/03/2013 16:28   Ct Head Wo Contrast  06/03/2013   *RADIOLOGY REPORT*  Clinical Data: Headache; transient loss of vision at the right eye.  CT HEAD WITHOUT CONTRAST  Technique:  Contiguous axial images were obtained from the base of the skull through the vertex without contrast.  Comparison: CT of the head performed 02/13/2007  Findings: There is no evidence of acute infarction, mass lesion, or intra- or extra-axial hemorrhage on CT.  Scattered periventricular and subcortical white  matter change likely reflects small vessel ischemic microangiopathy.  Mild chronic encephalomalacia is noted at the lateral aspect of the left occipital lobe.  The posterior fossa, including the cerebellum, brainstem and fourth ventricle, is within normal limits.  The third and lateral ventricles, and basal ganglia are unremarkable in appearance.  No mass effect or midline shift is seen.  There is no evidence of fracture; visualized osseous structures are unremarkable in appearance.  The orbits are within normal limits. The paranasal sinuses and mastoid air cells are well-aerated.  No significant soft tissue abnormalities are seen.  IMPRESSION:  1.  No acute intracranial pathology seen on CT. 2.  Small vessel ischemic microangiopathy. 3.  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   Original Report Authenticated By: Tonia Ghent, M.D.   Ct Angio Neck W/cm &/or Wo/cm  06/04/2013   CLINICAL DATA:  TIA. Complete visual loss on the right, now resolved. Question amaurosis.  EXAM: CT ANGIOGRAPHY NECK  TECHNIQUE: Multidetector CT imaging of the neck was performed using the standard protocol during bolus administration of intravenous contrast. Multiplanar CT image reconstructions including MIPs were obtained to evaluate the vascular anatomy. Carotid stenosis measurements (when applicable) are obtained utilizing NASCET criteria, using the distal internal carotid diameter as the denominator.  CONTRAST:  50mL OMNIPAQUE IOHEXOL 350 MG/ML SOLN  COMPARISON:  MRI brain 06/03/2013.  FINDINGS: Arch and great vessels: Moderate atheromatous change transverse arch. No proximal stenosis. Conventional branching.  Right carotid system: Heavily calcified atheromatous change at the bifurcation. 40%, non flow limiting stenosis (3.4/ 5.3 proximal/distal ratio). No dissection or soft plaque. No ulceration.  Left carotid system: Minimal mural calcification at the  bifurcation. Minimal posterior wall plaque. No measurable stenosis.  Vertebral arteries: Both vertebrals are patent. There is no ostial stenosis. The left vertebral is dominant.  Additional findings: Advanced cervical spondylosis without focal osseous lesion. Mild pannus. Airway midline. No adenopathy. Unremarkable intracranial compartment. No neck masses. Subcentimeter left thyroid cyst. Unremarkable Long apices and upper mediastinum.  Review of the MIP images confirms the above findings.  IMPRESSION: Heavily calcified but non flow reducing (40%) stenosis at the right carotid bifurcation. No ulceration or soft plaque. The patient's amaurosis did not clearly result from atheromatous change in the right carotid vascular territory.   Electronically Signed   By: Davonna Belling M.D.   On: 06/04/2013 11:35   Mr Maxine Glenn Head Wo Contrast  06/03/2013   CLINICAL DATA:  Sudden loss of vision right eye. Atrial fibrillation.  EXAM: MRI HEAD WITHOUT CONTRAST  MRA HEAD WITHOUT CONTRAST  TECHNIQUE: Multiplanar, multiecho pulse sequences of the brain and surrounding structures were obtained without intravenous contrast. Angiographic images of the head were obtained using MRA technique without contrast.  COMPARISON:  Head CT same day  FINDINGS: MRI HEAD FINDINGS  Diffusion imaging does not show any acute or subacute infarction. The brainstem and cerebellum are unremarkable. The cerebral hemispheres show moderate chronic appearing small vessel changes throughout the deep and subcortical white matter. No cortical or large vessel territory infarction. No mass lesion, hemorrhage, hydrocephalus or extra-axial collection. No pituitary mass. There is arachnoid herniation into the sella. Sinuses are clear.  MRA HEAD FINDINGS  Both internal carotid arteries are widely patent into the brain. There is atherosclerotic irregularity in both carotid siphon regions but no suspicion of flow-limiting stenosis. The A1 segment on the left is hypoplastic,  both the anterior cerebral arteries receiving there supply from the right carotid circulation. The left posterior cerebral artery takes a fetal origin.  Both vertebral arteries are patent to the basilar. These vessels are tortuous and show some atherosclerotic irregularity. There is a left vertebral stenosis at the foramina magnum level estimated at 50%. No basilar stenosis. Posterior circulation branch vessels are patent. More distal branch vessels do show some atherosclerotic irregularity.  IMPRESSION: MRI HEAD IMPRESSION  No acute infarction. Chronic small vessel changes throughout the cerebral hemispheric white matter.  MRA HEAD IMPRESSION  No major vessel occlusion or correctable proximal stenosis. Atherosclerotic irregularity of the more distal branch vessels. 50% stenosis of the left vertebral artery at the foramen magnum level. Atherosclerotic irregularity in the carotid siphon regions.   Electronically Signed   By: Paulina Fusi M.D.   On: 06/03/2013 14:00   Mr Brain Wo Contrast  06/03/2013   CLINICAL DATA:  Sudden loss of vision right eye. Atrial fibrillation.  EXAM: MRI HEAD WITHOUT CONTRAST  MRA HEAD WITHOUT CONTRAST  TECHNIQUE: Multiplanar, multiecho pulse sequences of the brain and surrounding structures were obtained without intravenous contrast. Angiographic images of the head were obtained using MRA technique without contrast.  COMPARISON:  Head CT same day  FINDINGS: MRI HEAD FINDINGS  Diffusion imaging does not show any acute or subacute infarction. The brainstem and cerebellum are unremarkable. The cerebral hemispheres show moderate chronic appearing small vessel changes throughout the deep and subcortical white matter. No cortical or large vessel territory infarction. No mass lesion, hemorrhage, hydrocephalus or extra-axial collection. No pituitary mass. There is arachnoid herniation into the sella. Sinuses are clear.  MRA HEAD FINDINGS  Both internal carotid arteries are widely patent into  the brain. There is atherosclerotic irregularity in both carotid siphon regions but no suspicion of flow-limiting stenosis. The A1 segment on the left is hypoplastic, both the anterior cerebral arteries receiving there supply from the right carotid circulation. The left posterior cerebral artery takes a fetal origin.  Both vertebral arteries are patent to the basilar. These vessels are tortuous and show some atherosclerotic irregularity. There is a left vertebral stenosis at the foramina magnum level estimated at 50%. No basilar stenosis. Posterior circulation branch vessels are patent. More distal branch vessels do show some atherosclerotic irregularity.  IMPRESSION: MRI HEAD IMPRESSION  No acute infarction. Chronic small vessel changes throughout the cerebral hemispheric white matter.  MRA HEAD IMPRESSION  No major vessel occlusion or correctable proximal stenosis. Atherosclerotic irregularity of the more distal branch vessels. 50% stenosis of the left vertebral  artery at the foramen magnum level. Atherosclerotic irregularity in the carotid siphon regions.   Electronically Signed   By: Paulina Fusi M.D.   On: 06/03/2013 14:00    Microbiology: No results found for this or any previous visit (from the past 240 hour(s)).   Labs: Basic Metabolic Panel:  Recent Labs Lab 06/03/13 0139 06/03/13 0256 06/03/13 1230  NA 142 143 141  K 3.8 3.4* 4.8  CL 104 105 100  CO2 25  --  25  GLUCOSE 131* 142* 193*  BUN 22 24* 19  CREATININE 0.99 1.30 0.94  CALCIUM 8.9  --  9.4   Liver Function Tests:  Recent Labs Lab 06/03/13 1230  AST 25  ALT 37  ALKPHOS 69  BILITOT 0.8  PROT 7.5  ALBUMIN 4.3   No results found for this basename: LIPASE, AMYLASE,  in the last 168 hours No results found for this basename: AMMONIA,  in the last 168 hours CBC:  Recent Labs Lab 06/03/13 0139 06/03/13 0256 06/03/13 1230  WBC 8.7  --  13.1*  NEUTROABS 5.6  --  12.6*  HGB 16.2 16.3 17.5*  HCT 45.9 48.0 49.5   MCV 91.4  --  91.3  PLT 180  --  199   Cardiac Enzymes:  Recent Labs Lab 06/03/13 0144 06/03/13 0522  TROPONINI <0.30 <0.30   BNP: BNP (last 3 results)  Recent Labs  06/03/13 1319  PROBNP 244.1   CBG:  Recent Labs Lab 06/03/13 0435  GLUCAP 151*       Signed:  KRISHNAN,SENDIL K  [facility] 06/04/2013, 2:12 PM

## 2013-06-04 NOTE — Progress Notes (Addendum)
NEURO HOSPITALIST PROGRESS NOTE   SUBJECTIVE:                                                                                                                        Offers no neurological complains. Stated that the HA is gone and his vision is back to baseline. MRI/MRA brain and TTE are unimpressive. ESR and C-reactive protein normal. CUS results pending. Of importance, he indicated that previous CUS few years ago showed >90% narrowing right ICA. Has a history of retinal detachment right eye.  OBJECTIVE:                                                                                                                           Vital signs in last 24 hours: Temp:  [97.7 F (36.5 C)-98.5 F (36.9 C)] 98.2 F (36.8 C) (10/10 0400) Pulse Rate:  [44-77] 44 (10/10 0400) Resp:  [18] 18 (10/09 1600) BP: (101-144)/(46-64) 101/53 mmHg (10/10 0400) SpO2:  [96 %-99 %] 96 % (10/10 0400) Weight:  [79.606 kg (175 lb 8 oz)] 79.606 kg (175 lb 8 oz) (10/10 0400)  Intake/Output from previous day: 10/09 0701 - 10/10 0700 In: 600 [P.O.:600] Out: -  Intake/Output this shift:   Nutritional status: Cardiac  Past Medical History  Diagnosis Date  . Hypertension   . Whooping cough   . Atrial fibrillation   . Pneumonia     Neurologic Exam:  Mental Status:  Alert, oriented, thought content appropriate. Speech fluent without evidence of aphasia. Able to follow 3 step commands without difficulty.  Cranial Nerves:  II: Discs flat bilaterally; Visual fields grossly normal on the left, pupils equal, round, reactive to light and accommodation  III,IV, VI: ptosis not present, extra-ocular motions intact bilaterally  V,VII: smile symmetric, facial light touch sensation normal bilaterally  VIII: hearing normal bilaterally  IX,X: gag reflex present  XI: bilateral shoulder shrug  XII: midline tongue extension  Motor:  Right : Upper extremity 5/5 Left: Upper extremity  5/5  Lower extremity 5/5 Lower extremity 5/5  Tone and bulk:normal tone throughout; no atrophy noted  Sensory: Pinprick and light touch intact throughout, bilaterally  Deep Tendon Reflexes: Symmetric throughout  Plantars:  Right: downgoing Left: downgoing  Cerebellar:  normal finger-to-nose and normal heel-to-shin test  Gait: no ataxia. CV: pulses palpable throughout    Lab Results: Lab Results  Component Value Date/Time   CHOL 150 06/04/2013  6:15 AM   Lipid Panel  Recent Labs  06/04/13 0615  CHOL 150  TRIG 65  HDL 55  CHOLHDL 2.7  VLDL 13  LDLCALC 82    Studies/Results: Dg Chest 2 View  06/03/2013   CLINICAL DATA:  Facial pain  EXAM: CHEST  2 VIEW  COMPARISON:  2/25  FINDINGS: The heart size and mediastinal contours are within normal limits. Both lungs are clear. The visualized skeletal structures are unremarkable.  IMPRESSION: No acute abnormality noted.   Electronically Signed   By: Alcide Clever M.D.   On: 06/03/2013 16:28   Ct Head Wo Contrast  06/03/2013   *RADIOLOGY REPORT*  Clinical Data: Headache; transient loss of vision at the right eye.  CT HEAD WITHOUT CONTRAST  Technique:  Contiguous axial images were obtained from the base of the skull through the vertex without contrast.  Comparison: CT of the head performed 02/13/2007  Findings: There is no evidence of acute infarction, mass lesion, or intra- or extra-axial hemorrhage on CT.  Scattered periventricular and subcortical white matter change likely reflects small vessel ischemic microangiopathy.  Mild chronic encephalomalacia is noted at the lateral aspect of the left occipital lobe.  The posterior fossa, including the cerebellum, brainstem and fourth ventricle, is within normal limits.  The third and lateral ventricles, and basal ganglia are unremarkable in appearance.  No mass effect or midline shift is seen.  There is no evidence of fracture; visualized osseous structures are unremarkable in appearance.  The orbits  are within normal limits. The paranasal sinuses and mastoid air cells are well-aerated.  No significant soft tissue abnormalities are seen.  IMPRESSION:  1.  No acute intracranial pathology seen on CT. 2.  Small vessel ischemic microangiopathy. 3.  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   Original Report Authenticated By: Tonia Ghent, M.D.   Mr [facility] Wo Contrast  06/03/2013   CLINICAL DATA:  Sudden loss of vision right eye. Atrial fibrillation.  EXAM: MRI HEAD WITHOUT CONTRAST  MRA HEAD WITHOUT CONTRAST  TECHNIQUE: Multiplanar, multiecho pulse sequences of the brain and surrounding structures were obtained without intravenous contrast. Angiographic images of the head were obtained using MRA technique without contrast.  COMPARISON:  Head CT same day  FINDINGS: MRI HEAD FINDINGS  Diffusion imaging does not show any acute or subacute infarction. The brainstem and cerebellum are unremarkable. The cerebral hemispheres show moderate chronic appearing small vessel changes throughout the deep and subcortical white matter. No cortical or large vessel territory infarction. No mass lesion, hemorrhage, hydrocephalus or extra-axial collection. No pituitary mass. There is arachnoid herniation into the sella. Sinuses are clear.  MRA HEAD FINDINGS  Both internal carotid arteries are widely patent into the brain. There is atherosclerotic irregularity in both carotid siphon regions but no suspicion of flow-limiting stenosis. The A1 segment on the left is hypoplastic, both the anterior cerebral arteries receiving there supply from the right carotid circulation. The left posterior cerebral artery takes a fetal origin.  Both vertebral arteries are patent to the basilar. These vessels are tortuous and show some atherosclerotic irregularity. There is a left vertebral stenosis at the foramina magnum level estimated at 50%. No basilar stenosis. Posterior  circulation branch vessels are patent. More distal branch vessels do show  some atherosclerotic irregularity.  IMPRESSION: MRI HEAD IMPRESSION  No acute infarction. Chronic small vessel changes throughout the cerebral hemispheric white matter.  MRA HEAD IMPRESSION  No major vessel occlusion or correctable proximal stenosis. Atherosclerotic irregularity of the more distal branch vessels. 50% stenosis of the left vertebral artery at the foramen magnum level. Atherosclerotic irregularity in the carotid siphon regions.   Electronically Signed   By: Paulina Fusi M.D.   On: 06/03/2013 14:00   Mr Brain Wo Contrast  06/03/2013   CLINICAL DATA:  Sudden loss of vision right eye. Atrial fibrillation.  EXAM: MRI HEAD WITHOUT CONTRAST  MRA HEAD WITHOUT CONTRAST  TECHNIQUE: Multiplanar, multiecho pulse sequences of the brain and surrounding structures were obtained without intravenous contrast. Angiographic images of the head were obtained using MRA technique without contrast.  COMPARISON:  Head CT same day  FINDINGS: MRI HEAD FINDINGS  Diffusion imaging does not show any acute or subacute infarction. The brainstem and cerebellum are unremarkable. The cerebral hemispheres show moderate chronic appearing small vessel changes throughout the deep and subcortical white matter. No cortical or large vessel territory infarction. No mass lesion, hemorrhage, hydrocephalus or extra-axial collection. No pituitary mass. There is arachnoid herniation into the sella. Sinuses are clear.  MRA HEAD FINDINGS  Both internal carotid arteries are widely patent into the brain. There is atherosclerotic irregularity in both carotid siphon regions but no suspicion of flow-limiting stenosis. The A1 segment on the left is hypoplastic, both the anterior cerebral arteries receiving there supply from the right carotid circulation. The left posterior cerebral artery takes a fetal origin.  Both vertebral arteries are patent to the basilar. These vessels are  tortuous and show some atherosclerotic irregularity. There is a left vertebral stenosis at the foramina magnum level estimated at 50%. No basilar stenosis. Posterior circulation branch vessels are patent. More distal branch vessels do show some atherosclerotic irregularity.  IMPRESSION: MRI HEAD IMPRESSION  No acute infarction. Chronic small vessel changes throughout the cerebral hemispheric white matter.  MRA HEAD IMPRESSION  No major vessel occlusion or correctable proximal stenosis. Atherosclerotic irregularity of the more distal branch vessels. 50% stenosis of the left vertebral artery at the foramen magnum level. Atherosclerotic irregularity in the carotid siphon regions.   Electronically Signed   By: Paulina Fusi M.D.   On: 06/03/2013 14:00    MEDICATIONS                                                                                                                       I have reviewed the patient's current medications.  ASSESSMENT/PLAN:  H28 y/o with HTN, atrial fibrillation, known carotid disease, admitted after sustaining a transient episode of HA with complete right visual loss. Probable TIA, although the degree of eye pain is kind of unusual for a retinal TIA. His right eye vision is back to baseline which is not quite consistent with retinal detachment. No history of migraine. Carotid ultrasound pending but he reports previous CUS with >90% stenosis right ICA. I am in favor of getting a CTA neck regardless of the results of CUS and then decide whether or not he had a retinal TIA and requires right CEA. Opthalmology evaluation as outpatient will be helpful. Will follow up.    Wyatt Portela, MD [facility] [phone]  06/04/2013, 9:28 AM  Addendum: CTA neck and CUS did not show evidence of hemodynamically significant carotid disease which makes the possibility of  retinal TIA unlikely. In addition, no history of migraine. Rapid return of his vision with normal ESR and C-reactive protein despite no full treatment with steroids. MRI-DWI normal. Once again, he presented with right eye pain and complete painless unilateral right visual loss and thus I strongly recommended follow up with opthalmology as outpatient.  Wyatt Portela, MD [facility].

## 2013-06-04 NOTE — Evaluation (Signed)
I have reviewed this note and agree with all findings. Kati Antario Yasuda, PT, DPT Pager: [phone]   

## 2013-06-04 NOTE — Evaluation (Signed)
Physical Therapy One Time Evaluation Patient Details Name: Stephen Hendrix MRN: [id number] DOB: [birth year] Today's Date: 06/04/2013 Time: 4098-1191 PT Time Calculation (min): 9 min  PT Assessment / Plan / Recommendation History of Present Illness  Pt is an 79 y/o with HTN, atrial fibrillation, known carotid disease, admitted after sustaining a transient episode of HA with complete right visual loss. Probable TIA, although the degree of eye pain is kind of unusual for a retinal TIA. His right eye vision is back to baseline which is not quite consistent with retinal detachment. No history of migraine.  Clinical Impression  Pt evaluated by Physical Therapy with no further acute PT needs identified. All education has been completed and the pt has no further questions. Pt is able to perform all mobility at MOD I or independent level.  PT is signing off. Thank you for this referral.    PT Assessment  Patent does not need any further PT services    Follow Up Recommendations  No PT follow up    Does the patient have the potential to tolerate intense rehabilitation      Barriers to Discharge        Equipment Recommendations  None recommended by PT    Recommendations for Other Services     Frequency      Precautions / Restrictions Precautions Precautions: Fall Precaution Comments: low fall risk Restrictions Weight Bearing Restrictions: No   Pertinent Vitals/Pain No c/o of pain, numbness/tingling, or vision deficits during session.      Mobility  Bed Mobility Bed Mobility: Supine to Sit;Sitting - Scoot to Edge of Bed Supine to Sit: 7: Independent Sitting - Scoot to Edge of Bed: 7: Independent Details for Bed Mobility Assistance: pt able to perform all bed mobility in a safe and efficient manner. Transfers Transfers: Sit to Stand;Stand to Sit Sit to Stand: From bed;7: Independent Stand to Sit: To bed;7: Independent Details for Transfer Assistance: pt peformed all transfer in a  safe and efficient manner. Ambulation/Gait Ambulation/Gait Assistance: 6: Modified independent (Device/Increase time) Ambulation Distance (Feet): 200 Feet Assistive device: None Ambulation/Gait Assistance Details: MOD I level due to increased time but pt able to perform horizontal head turns and read room numbers (on R side) without any LOB episodes. Gait Pattern: Within Functional Limits Gait velocity: slightly decreased Stairs: No Modified Rankin (Stroke Patients Only) Pre-Morbid Rankin Score: No symptoms Modified Rankin: No symptoms    Exercises     PT Diagnosis:    PT Problem List:   PT Treatment Interventions:       PT Goals(Current goals can be found in the care plan section) Acute Rehab PT Goals PT Goal Formulation: No goals set, d/c therapy  Visit Information  Last PT Received On: 06/04/13 Assistance Needed: +1 History of Present Illness: Pt is an 79 y/o with HTN, atrial fibrillation, known carotid disease, admitted after sustaining a transient episode of HA with complete right visual loss. Probable TIA, although the degree of eye pain is kind of unusual for a retinal TIA. His right eye vision is back to baseline which is not quite consistent with retinal detachment. No history of migraine.       Prior Functioning  Home Living Family/patient expects to be discharged to:: Private residence Living Arrangements: Spouse/significant other Type of Home: House Home Access: Stairs to enter Secretary/administrator of Steps: 3 Entrance Stairs-Rails: Left Home Layout: One level Home Equipment: None Additional Comments: has house in [place] and one in [place], [place]  Prior Function Level of Independence: Independent Communication Communication: No difficulties    Cognition  Cognition Arousal/Alertness: Awake/alert Behavior During Therapy: WFL for tasks assessed/performed Overall Cognitive Status: Within Functional Limits for tasks assessed    Extremity/Trunk  Assessment Lower Extremity Assessment Lower Extremity Assessment: Overall WFL for tasks assessed   Balance    End of Session PT - End of Session Activity Tolerance: Patient tolerated treatment well Patient left: in bed;Other (comment);with call bell/phone within reach;with family/visitor present (sitting EOB while imaging tech entering room to take pt to testing)  GP     Stephen Hendrix 06/04/2013, 12:45 PM

## 2013-06-04 NOTE — Evaluation (Signed)
Speech Language Pathology Evaluation Patient Details Name: Rondel Episcopo MRN: [id number] DOB: [birth year] Today's Date: 06/04/2013 Time: 4098-1191 SLP Time Calculation (min): 12 min  Problem List:  Patient Active Problem List   Diagnosis Date Noted  . Vision changes 06/03/2013  . Headache 06/03/2013  . Atrial fibrillation 06/03/2013  . HTN (hypertension) 06/03/2013  . HLD (hyperlipidemia) 06/03/2013   Past Medical History:  Past Medical History  Diagnosis Date  . Hypertension   . Whooping cough   . Atrial fibrillation   . Pneumonia    Past Surgical History:  Past Surgical History  Procedure Laterality Date  . Tonsillectomy     HPI:  79 y.o. male with history of atrial fibrillation, hypertension, pna and hyperlipidemia presented to the ER because of sudden loss of vision loss in the right eye while watching television last midnight. Patient states that since yesterday morning when he woke up he has been having right-sided temporal headache which was like stabbing in nature and retro-orbital pain. Later in the midline patient lost eyesight in the right eye without any headache.  CT of the head was negative for anything acute.  MRI revealed No acute infarction. Chronic small vessel changes throughout the cerebral hemispheric white matter.   Assessment / Plan / Recommendation Clinical Impression  Pt. had no complaints with speech, cognition or language at time of episode.  He continues to exhibit appropriate and functional cognitive-linguistic abilites.  He reports he is very active, physically and cognitively.  No ST warranted.    SLP Assessment  Patient does not need any further Speech Lanaguage Pathology Services    Follow Up Recommendations  None    Frequency and Duration        Pertinent Vitals/Pain none    SLP Evaluation Prior Functioning  Cognitive/Linguistic Baseline: Within functional limits Type of Home: House  Lives With: Spouse Vocation: Retired (owned  Stage manager)   Cognition  Overall Cognitive Status: Within Systems developer for tasks assessed Orientation Level: Oriented X4 Safety/Judgment: Appears intact    Comprehension  Auditory Comprehension Overall Auditory Comprehension: Appears within functional limits for tasks assessed Visual Recognition/Discrimination Discrimination: Not tested Reading Comprehension Reading Status: Not tested (reports eyesight back to baseline)    Expression Expression Primary Mode of Expression: Verbal Verbal Expression Overall Verbal Expression: Appears within functional limits for tasks assessed Pragmatics: No impairment Written Expression Written Expression: Not tested   Oral / Motor Oral Motor/Sensory Function Overall Oral Motor/Sensory Function: Appears within functional limits for tasks assessed Motor Speech Overall Motor Speech: Appears within functional limits for tasks assessed Intelligibility: Intelligible Motor Planning: Witnin functional limits        [employer] [phone]    06/04/2013

## 2013-06-15 ENCOUNTER — Encounter: Payer: Self-pay | Admitting: Interventional Cardiology

## 2013-06-15 ENCOUNTER — Ambulatory Visit (INDEPENDENT_AMBULATORY_CARE_PROVIDER_SITE_OTHER): Payer: Medicare PPO | Admitting: Interventional Cardiology

## 2013-06-15 VITALS — BP 150/70 | HR 68 | Ht 66.0 in | Wt 172.8 lb

## 2013-06-15 DIAGNOSIS — I10 Essential (primary) hypertension: Secondary | ICD-10-CM

## 2013-06-15 DIAGNOSIS — I4891 Unspecified atrial fibrillation: Secondary | ICD-10-CM

## 2013-06-15 DIAGNOSIS — E785 Hyperlipidemia, unspecified: Secondary | ICD-10-CM

## 2013-06-15 DIAGNOSIS — G459 Transient cerebral ischemic attack, unspecified: Secondary | ICD-10-CM

## 2013-06-15 MED ORDER — APIXABAN 5 MG PO TABS: 5.0000 mg | ORAL_TABLET | Freq: Two times a day (BID) | ORAL | Status: DC

## 2013-06-15 NOTE — Patient Instructions (Signed)
Your physician wants you to follow-up in: 4 months with Dr. Eldridge Dace. You will receive a reminder letter in the mail two months in advance. If you don't receive a letter, please call our office to schedule the follow-up appointment.  Your physician recommends that you return for lab work on the day of your next appt.  Start Eliquis 5mg  1 tablet by mouth twice a day.  Stop Ibuprofen and Aspirin.

## 2013-06-15 NOTE — Progress Notes (Signed)
Patient ID: Stephen Hendrix, male   DOB: 13-Mar-1929, 79 y.o.   MRN: [id number]     Patient ID: Stephen Hendrix MRN: [id number] DOB/AGE: 1928/12/14 79 y.o.   Referring Physician Dr. Rito Ehrlich   Reason for Consultation: TIA affectong vision  HPI: 79 y/o who has had AFib for at least 10 years. He spends 6 months of the year here and 6 months in [place].  Diagnosed with AFib after palpitations.  No cardioversions and ever took Coumadin.  He was taking Plavix for several years, 3 x /week, due to carotid disease.  He did not have any documented atrial fibrillation while in the hospital. He is sent here for further evaluation of his atrial fibrillation. He has a cardiologist in [place] and he is checked regularly there. He has been on amiodarone for he thinks at least 10-15 years. He has blood work regularly with his regular cardiologist. I presume this includes thyroid and liver tests.   Current Outpatient Prescriptions  Medication Sig Dispense Refill  . amiodarone (PACERONE) 200 MG tablet Take 200 mg by mouth daily.      Marland Kitchen amLODipine (NORVASC) 5 MG tablet Take 5 mg by mouth daily.      Marland Kitchen aspirin EC 81 MG tablet Take 1 tablet (81 mg total) by mouth daily.      Marland Kitchen atorvastatin (LIPITOR) 40 MG tablet Take 1 tablet (40 mg total) by mouth daily.  30 tablet  0  . eplerenone (INSPRA) 25 MG tablet Take 25 mg by mouth daily.      Marland Kitchen ibuprofen (ADVIL,MOTRIN) 200 MG tablet Take 200 mg by mouth daily as needed for pain.      Marland Kitchen losartan (COZAAR) 50 MG tablet Take 50 mg by mouth daily.       No current facility-administered medications for this visit.   Past Medical History  Diagnosis Date  . Hypertension   . Whooping cough   . Atrial fibrillation   . Pneumonia     Family History  Problem Relation Age of Onset  . CAD Father   . Colon cancer Other     History   Social History  . Marital Status: Married    Spouse Name: N/A    Number of Children: N/A  . Years of Education: N/A   Occupational  History  . Not on file.   Social History Main Topics  . Smoking status: Never Smoker   . Smokeless tobacco: Not on file  . Alcohol Use: 1.2 oz/week    2 Glasses of wine per week  . Drug Use: No  . Sexual Activity: No   Other Topics Concern  . Not on file   Social History Narrative  . No narrative on file    Past Surgical History  Procedure Laterality Date  . Tonsillectomy        (Not in a hospital admission)  Review of systems complete and found to be negative unless listed above .  No nausea, vomiting.  No fever chills, No focal weakness,  No palpitations. Blurry vision. He is not sure if this is partially related to a cataract.  Physical Exam: Filed Vitals:   06/15/13 1017  BP: 150/70  Pulse: 68    Weight: 172 lb 12.8 oz (78.382 kg)  Physical exam:  [place]/AT EOMI No JVD, No carotid bruit RRR S1S2 , 2/6 systolic murmur No wheezing Soft. NT, nondistended No edema. No focal motor or sensory deficits Normal affect  Labs:   Lab Results  Component  Value Date   WBC 13.1* 06/03/2013   HGB 17.5* 06/03/2013   HCT 49.5 06/03/2013   MCV 91.3 06/03/2013   PLT 199 06/03/2013   No results found for this basename: NA, K, CL, CO2, BUN, CREATININE, CALCIUM, LABALBU, PROT, BILITOT, ALKPHOS, ALT, AST, GLUCOSE,  in the last 168 hours Lab Results  Component Value Date   TROPONINI <0.30 06/03/2013    Lab Results  Component Value Date   CHOL 150 06/04/2013   Lab Results  Component Value Date   HDL 55 06/04/2013   Lab Results  Component Value Date   LDLCALC 82 06/04/2013   Lab Results  Component Value Date   TRIG 65 06/04/2013   Lab Results  Component Value Date   CHOLHDL 2.7 06/04/2013   No results found for this basename: LDLDIRECT      Radiology:Normal chest xray. EKG:NSR, IRBBB, no ST segment changes  ASSESSMENT AND PLAN:  1.  AFib: In NSR.  Continue Amiodarone.  Will start Eliquis.  High risk of stroke based on CHADS score.  Stop aspirin.  Embolism to eye  could have been from AFib vs. Plaque embolization.  He has severe external carotid disease bilaterally.  No indication for CEA.   2.  echocardiogram did not reveal any source of embolism. Lipids well controlled. 3. Hypertension: Borderline control today. He should watch that at home. Continue ARB. His son is an Designer, industrial/product in [place]. I tried to call him to update him regarding medication change and the rationale behind using Eliquis. Signed:   Fredric Mare, MD, [facility] 06/15/2013, 10:49 AM

## 2013-06-29 ENCOUNTER — Encounter (HOSPITAL_COMMUNITY): Payer: Self-pay | Admitting: Emergency Medicine

## 2013-06-29 ENCOUNTER — Emergency Department (HOSPITAL_COMMUNITY): Payer: Medicare PPO

## 2013-06-29 ENCOUNTER — Emergency Department (HOSPITAL_COMMUNITY)
Admission: EM | Admit: 2013-06-29 | Discharge: 2013-06-29 | Disposition: A | Discharge status: Home / Self Care | Payer: Medicare PPO | Attending: Emergency Medicine | Admitting: Emergency Medicine

## 2013-06-29 DIAGNOSIS — H5460 Unqualified visual loss, one eye, unspecified: Secondary | ICD-10-CM | POA: Insufficient documentation

## 2013-06-29 DIAGNOSIS — I10 Essential (primary) hypertension: Secondary | ICD-10-CM | POA: Insufficient documentation

## 2013-06-29 DIAGNOSIS — Z880 Allergy status to penicillin: Secondary | ICD-10-CM | POA: Insufficient documentation

## 2013-06-29 DIAGNOSIS — Z8673 Personal history of transient ischemic attack (TIA), and cerebral infarction without residual deficits: Secondary | ICD-10-CM | POA: Insufficient documentation

## 2013-06-29 DIAGNOSIS — R42 Dizziness and giddiness: Secondary | ICD-10-CM

## 2013-06-29 DIAGNOSIS — H8110 Benign paroxysmal vertigo, unspecified ear: Secondary | ICD-10-CM | POA: Insufficient documentation

## 2013-06-29 DIAGNOSIS — Z79899 Other long term (current) drug therapy: Secondary | ICD-10-CM | POA: Insufficient documentation

## 2013-06-29 HISTORY — DX: Transient cerebral ischemic attack, unspecified: G45.9

## 2013-06-29 LAB — CBC WITH DIFFERENTIAL/PLATELET
Basophils Absolute: 0.1 10*3/uL (ref 0.0–0.1)
Basophils Relative: 1 % (ref 0–1)
Eosinophils Absolute: 0.3 10*3/uL (ref 0.0–0.7)
Eosinophils Relative: 3 % (ref 0–5)
HCT: 46.3 % (ref 39.0–52.0)
MCH: 32.5 pg (ref 26.0–34.0)
MCHC: 35.9 g/dL (ref 30.0–36.0)
Monocytes Absolute: 0.7 10*3/uL (ref 0.1–1.0)
Monocytes Relative: 8 % (ref 3–12)
Neutro Abs: 5.8 10*3/uL (ref 1.7–7.7)
Neutrophils Relative %: 65 % (ref 43–77)
RDW: 13 % (ref 11.5–15.5)
WBC: 9 10*3/uL (ref 4.0–10.5)

## 2013-06-29 LAB — BASIC METABOLIC PANEL
BUN: 18 mg/dL (ref 6–23)
CO2: 28 mEq/L (ref 19–32)
Calcium: 9.4 mg/dL (ref 8.4–10.5)
Chloride: 102 mEq/L (ref 96–112)
Creatinine, Ser: 1.09 mg/dL (ref 0.50–1.35)
GFR calc non Af Amer: 62 mL/min — ABNORMAL LOW (ref >90)

## 2013-06-29 LAB — SEDIMENTATION RATE: Sed Rate: 2 mm/hr (ref 0–16)

## 2013-06-29 LAB — POCT I-STAT TROPONIN I: Troponin i, poc: 0.03 ng/mL (ref 0.00–0.08)

## 2013-06-29 MED ORDER — MECLIZINE HCL 25 MG PO TABS
12.5000 mg | ORAL_TABLET | Freq: Once | ORAL | Status: AC
  Administered 2013-06-29: 12.5 mg via ORAL
  Filled 2013-06-29: qty 1

## 2013-06-29 NOTE — ED Notes (Addendum)
Pt c/o shooting pains on the right side of his head. Family states that on Oct 9th pt experienced complete blindness of his right eye and was admitted. 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 Right eye blindness has since somewhat resolved. Pt also c/o dizziness and sweating at home. Pt took 12.5 of antivert at home.

## 2013-06-29 NOTE — ED Provider Notes (Addendum)
CSN: [id number]     Arrival date & time 06/29/13  7829 History   First MD Initiated Contact with Patient 06/29/13 0601     Chief Complaint  Patient presents with  . Dizziness   (Consider location/radiation/quality/duration/timing/severity/associated sxs/prior Treatment) HPI  Please note that this is a late entry. This patient was seen by me shortly after his presentation to the ED.   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 The patient comes in via POV with positional vertigo which began a few hours prior to presentation. He had some right temporal parietal headache pain which was aching and moderate in severity. But, pain has resolved.   The patient has a history of positional vertigo. His wife gave him meclizine 12.5mg  pta and this has helped his sx quite a bit. He denies any other neurologic sx which are new. He has had significantly diminished vision in the right eye since admission to this hospital about 1 month ago. 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 He had a normal ESR. He has followed up with neurooptholomology as well as a retina specialist.   Patient denies syncope, near syncope, SOB, headache. Denies any recent medication changes.   Past Medical History  Diagnosis Date  . Hypertension   . Whooping cough   . Atrial fibrillation   . Pneumonia   . TIA (transient ischemic attack)    Past Surgical History  Procedure Laterality Date  . Tonsillectomy     Family History  Problem Relation Age of Onset  . CAD Father   . Colon cancer Other    History  Substance Use Topics  . Smoking status: Never Smoker   . Smokeless tobacco: Not on file  . Alcohol Use: 1.2 oz/week    2 Glasses of wine per week    Review of Systems 10 point ROS performed and is negative with the exception of sx noted above.   Allergies  Methylprednisolone; Penicillins; and  Cortisone  Home Medications   Current Outpatient Rx  Name  Route  Sig  Dispense  Refill  . amiodarone (PACERONE) 200 MG tablet   Oral   Take 200 mg by mouth daily.         Marland Kitchen amLODipine (NORVASC) 5 MG tablet   Oral   Take 5 mg by mouth daily.         Marland Kitchen apixaban (ELIQUIS) 5 MG TABS tablet   Oral   Take 1 tablet (5 mg total) by mouth 2 (two) times daily.   60 tablet   11   . atorvastatin (LIPITOR) 40 MG tablet   Oral   Take 1 tablet (40 mg total) by mouth daily.   30 tablet   0   . eplerenone (INSPRA) 25 MG tablet   Oral   Take 25 mg by mouth daily.         Marland Kitchen losartan (COZAAR) 50 MG tablet   Oral   Take 50 mg by mouth daily.          BP 159/67  Pulse 73  Temp(Src) 98.5 F (36.9 C) (Oral)  Resp 16  SpO2 98% Physical Exam Gen: well developed and well nourished appearing Head: NCAT, mild ttp on palpation of the right temporal region Eyes: PERL, EOMI, no nystagmus - bilateral cataracts obscure fundoscopic exam Nose: no epistaixis or rhinorrhea Mouth/throat: mucosa is moist and pink  Neck: supple, no stridor, no lan Lungs: CTA B, no wheezing, rhonchi or rales CV: RRR, harsh systolic murmur over the right sternal border Abd: soft, notender, nondistended Back: no ttp, no cva ttp, mild kyphosis Skin: warm and dry Neuro: CN ii-xii grossly intact, no focal motor deficits, normal finger to nose, 5/5 motor strength both arms and legs.  Psyche; normal affect,  calm and cooperative.   ED Course  Procedures (including critical care time) Labs Review Recent Results (from the past 2160 hour(s))  CBC     Status: None   Collection Time    06/03/13  1:39 AM      Result Value Range   WBC 8.7  4.0 - 10.5 K/uL   RBC 5.02  4.22 - 5.81 MIL/uL   Hemoglobin 16.2  13.0 - 17.0 g/dL   HCT 16.1  09.6 - 04.5 %   MCV 91.4  78.0 - 100.0 fL   MCH 32.3  26.0 - 34.0 pg   MCHC 35.3  30.0 - 36.0 g/dL   RDW 40.9  81.1 - 91.4 %   Platelets 180  150 - 400 K/uL  BASIC METABOLIC  PANEL     Status: Abnormal   Collection Time    06/03/13  1:39 AM      Result Value Range   Sodium 142  135 - 145 mEq/L   Potassium 3.8  3.5 - 5.1 mEq/L   Chloride 104  96 - 112 mEq/L   CO2 25  19 - 32 mEq/L   Glucose, Bld 131 (*) 70 - 99 mg/dL   BUN 22  6 - 23 mg/dL   Creatinine, Ser 7.82  0.50 - 1.35 mg/dL   Calcium 8.9  8.4 - 95.6 mg/dL   GFR calc non Af Amer 75 (*) >90 mL/min   GFR calc Af Amer 87 (*) >90 mL/min   Comment: (NOTE)     The eGFR has been calculated using the CKD EPI equation.     This calculation has not been validated in all clinical situations.     eGFR's persistently <90 mL/min signify possible Chronic Kidney     Disease.  ETHANOL     Status: None   Collection Time    06/03/13  1:39 AM      Result Value Range   Alcohol, Ethyl (B) <11  0 - 11 mg/dL   Comment:            LOWEST DETECTABLE LIMIT FOR     SERUM ALCOHOL IS 11 mg/dL     FOR MEDICAL PURPOSES ONLY  PROTIME-INR     Status: None   Collection Time    06/03/13  1:39 AM      Result Value Range   Prothrombin Time 12.9  11.6 - 15.2 seconds   INR 0.99  0.00 - 1.49  APTT     Status: None   Collection Time    06/03/13  1:39 AM      Result Value Range   aPTT 28  24 - 37 seconds  DIFFERENTIAL     Status: None   Collection Time    06/03/13  1:39 AM      Result Value Range   Neutrophils Relative % 64  43 - 77 %   Neutro Abs 5.6  1.7 - 7.7 K/uL   Lymphocytes Relative 25  12 - 46 %   Lymphs Abs 2.1  0.7 - 4.0 K/uL   Monocytes Relative 9  3 - 12 %  Monocytes Absolute 0.7  0.1 - 1.0 K/uL   Eosinophils Relative 2  0 - 5 %   Eosinophils Absolute 0.2  0.0 - 0.7 K/uL   Basophils Relative 1  0 - 1 %   Basophils Absolute 0.1  0.0 - 0.1 K/uL  TROPONIN I     Status: None   Collection Time    06/03/13  1:44 AM      Result Value Range   Troponin I <0.30  <0.30 ng/mL   Comment:            Due to the release kinetics of cTnI,     a negative result within the first hours     of the onset of symptoms does  not rule out     myocardial infarction with certainty.     If myocardial infarction is still suspected,     repeat the test at appropriate intervals.  POCT I-STAT TROPONIN I     Status: None   Collection Time    06/03/13  2:54 AM      Result Value Range   Troponin i, poc 0.00  0.00 - 0.08 ng/mL   Comment 3            Comment: Due to the release kinetics of cTnI,     a negative result within the first hours     of the onset of symptoms does not rule out     myocardial infarction with certainty.     If myocardial infarction is still suspected,     repeat the test at appropriate intervals.  POCT I-STAT, CHEM 8     Status: Abnormal   Collection Time    06/03/13  2:56 AM      Result Value Range   Sodium 143  135 - 145 mEq/L   Potassium 3.4 (*) 3.5 - 5.1 mEq/L   Chloride 105  96 - 112 mEq/L   BUN 24 (*) 6 - 23 mg/dL   Creatinine, Ser 1.61  0.50 - 1.35 mg/dL   Glucose, Bld 096 (*) 70 - 99 mg/dL   Calcium, Ion 0.45  4.09 - 1.30 mmol/L   TCO2 24  0 - 100 mmol/L   Hemoglobin 16.3  13.0 - 17.0 g/dL   HCT 81.1  91.4 - 78.2 %  SEDIMENTATION RATE     Status: None   Collection Time    06/03/13  4:30 AM      Result Value Range   Sed Rate 1  0 - 16 mm/hr  C-REACTIVE PROTEIN     Status: Abnormal   Collection Time    06/03/13  4:30 AM      Result Value Range   CRP <0.5 (*) <0.60 mg/dL   Comment: Performed at [facility]  GLUCOSE, CAPILLARY     Status: Abnormal   Collection Time    06/03/13  4:35 AM      Result Value Range   Glucose-Capillary 151 (*) 70 - 99 mg/dL  URINE RAPID DRUG SCREEN (HOSP PERFORMED)     Status: None   Collection Time    06/03/13  5:05 AM      Result Value Range   Opiates NONE DETECTED  NONE DETECTED   Cocaine NONE DETECTED  NONE DETECTED   Benzodiazepines NONE DETECTED  NONE DETECTED   Amphetamines NONE DETECTED  NONE DETECTED   Tetrahydrocannabinol NONE DETECTED  NONE DETECTED   Barbiturates NONE DETECTED  NONE DETECTED   Comment:  DRUG  SCREEN FOR MEDICAL PURPOSES     ONLY.  IF CONFIRMATION IS NEEDED     FOR ANY PURPOSE, NOTIFY LAB     WITHIN 5 DAYS.                LOWEST DETECTABLE LIMITS     FOR URINE DRUG SCREEN     Drug Class       Cutoff (ng/mL)     Amphetamine      1000     Barbiturate      200     Benzodiazepine   200     Tricyclics       300     Opiates          300     Cocaine          300     THC              50  URINALYSIS, ROUTINE W REFLEX MICROSCOPIC     Status: None   Collection Time    06/03/13  5:05 AM      Result Value Range   Color, Urine YELLOW  YELLOW   APPearance CLEAR  CLEAR   Specific Gravity, Urine 1.016  1.005 - 1.030   pH 7.0  5.0 - 8.0   Glucose, UA NEGATIVE  NEGATIVE mg/dL   Hgb urine dipstick NEGATIVE  NEGATIVE   Bilirubin Urine NEGATIVE  NEGATIVE   Ketones, ur NEGATIVE  NEGATIVE mg/dL   Protein, ur NEGATIVE  NEGATIVE mg/dL   Urobilinogen, UA 1.0  0.0 - 1.0 mg/dL   Nitrite NEGATIVE  NEGATIVE   Leukocytes, UA NEGATIVE  NEGATIVE   Comment: MICROSCOPIC NOT DONE ON URINES WITH NEGATIVE PROTEIN, BLOOD, LEUKOCYTES, NITRITE, OR GLUCOSE <1000 mg/dL.  TROPONIN I     Status: None   Collection Time    06/03/13  5:22 AM      Result Value Range   Troponin I <0.30  <0.30 ng/mL   Comment:            Due to the release kinetics of cTnI,     a negative result within the first hours     of the onset of symptoms does not rule out     myocardial infarction with certainty.     If myocardial infarction is still suspected,     repeat the test at appropriate intervals.  COMPREHENSIVE METABOLIC PANEL     Status: Abnormal   Collection Time    06/03/13 12:30 PM      Result Value Range   Sodium 141  135 - 145 mEq/L   Potassium 4.8  3.5 - 5.1 mEq/L   Comment: DELTA CHECK NOTED     SLIGHT HEMOLYSIS   Chloride 100  96 - 112 mEq/L   CO2 25  19 - 32 mEq/L   Glucose, Bld 193 (*) 70 - 99 mg/dL   BUN 19  6 - 23 mg/dL   Creatinine, Ser 1.61  0.50 - 1.35 mg/dL   Calcium 9.4  8.4 - 09.6 mg/dL   Total  Protein 7.5  6.0 - 8.3 g/dL   Albumin 4.3  3.5 - 5.2 g/dL   AST 25  0 - 37 U/L   ALT 37  0 - 53 U/L   Alkaline Phosphatase 69  39 - 117 U/L   Total Bilirubin 0.8  0.3 - 1.2 mg/dL   GFR calc non Af Amer 76 (*) >90 mL/min   GFR  calc Af Amer 88 (*) >90 mL/min   Comment: (NOTE)     The eGFR has been calculated using the CKD EPI equation.     This calculation has not been validated in all clinical situations.     eGFR's persistently <90 mL/min signify possible Chronic Kidney     Disease.  CBC WITH DIFFERENTIAL     Status: Abnormal   Collection Time    06/03/13 12:30 PM      Result Value Range   WBC 13.1 (*) 4.0 - 10.5 K/uL   RBC 5.42  4.22 - 5.81 MIL/uL   Hemoglobin 17.5 (*) 13.0 - 17.0 g/dL   HCT 16.1  09.6 - 04.5 %   MCV 91.3  78.0 - 100.0 fL   MCH 32.3  26.0 - 34.0 pg   MCHC 35.4  30.0 - 36.0 g/dL   RDW 40.9  81.1 - 91.4 %   Platelets 199  150 - 400 K/uL   Neutrophils Relative % 96 (*) 43 - 77 %   Neutro Abs 12.6 (*) 1.7 - 7.7 K/uL   Lymphocytes Relative 3 (*) 12 - 46 %   Lymphs Abs 0.4 (*) 0.7 - 4.0 K/uL   Monocytes Relative 1 (*) 3 - 12 %   Monocytes Absolute 0.1  0.1 - 1.0 K/uL   Eosinophils Relative 0  0 - 5 %   Eosinophils Absolute 0.0  0.0 - 0.7 K/uL   Basophils Relative 0  0 - 1 %   Basophils Absolute 0.0  0.0 - 0.1 K/uL  TSH     Status: None   Collection Time    06/03/13 12:30 PM      Result Value Range   TSH 1.819  0.350 - 4.500 uIU/mL   Comment: Performed at [facility]  PRO B NATRIURETIC PEPTIDE     Status: None   Collection Time    06/03/13  1:19 PM      Result Value Range   Pro B Natriuretic peptide (BNP) 244.1  0 - 450 pg/mL  HEMOGLOBIN A1C     Status: None   Collection Time    06/04/13  4:15 AM      Result Value Range   Hemoglobin A1C 5.6  <5.7 %   Comment: (NOTE)                                                                               According to the ADA Clinical Practice Recommendations for 2011, when     HbA1c is used as a  screening test:      >=6.5%   Diagnostic of Diabetes Mellitus               (if abnormal result is confirmed)     5.7-6.4%   Increased risk of developing Diabetes Mellitus     References:Diagnosis and Classification of Diabetes Mellitus,Diabetes     Care,2011,34(Suppl 1):S62-S69 and Standards of Medical Care in             Diabetes - 2011,Diabetes Care,2011,34 (Suppl 1):S11-S61.   Mean Plasma Glucose 114  <117 mg/dL   Comment: Performed at [facility]  LIPID PANEL  Status: None   Collection Time    06/04/13  6:15 AM      Result Value Range   Cholesterol 150  0 - 200 mg/dL   Triglycerides 65  <409 mg/dL   HDL 55  >81 mg/dL   Total CHOL/HDL Ratio 2.7     VLDL 13  0 - 40 mg/dL   LDL Cholesterol 82  0 - 99 mg/dL   Comment:            Total Cholesterol/HDL:CHD Risk     Coronary Heart Disease Risk Table                         Men   Women      1/2 Average Risk   3.4   3.3      Average Risk       5.0   4.4      2 X Average Risk   9.6   7.1      3 X Average Risk  23.4   11.0                Use the calculated Patient Ratio     above and the CHD Risk Table     to determine the patient's CHD Risk.                ATP III CLASSIFICATION (LDL):      <100     mg/dL   Optimal      191-478  mg/dL   Near or Above                        Optimal      130-159  mg/dL   Borderline      295-621  mg/dL   High      >308     mg/dL   Very High  CBC WITH DIFFERENTIAL     Status: None   Collection Time    06/29/13  7:11 AM      Result Value Range   WBC 9.0  4.0 - 10.5 K/uL   RBC 5.10  4.22 - 5.81 MIL/uL   Hemoglobin 16.6  13.0 - 17.0 g/dL   HCT 65.7  84.6 - 96.2 %   MCV 90.8  78.0 - 100.0 fL   MCH 32.5  26.0 - 34.0 pg   MCHC 35.9  30.0 - 36.0 g/dL   RDW 95.2  84.1 - 32.4 %   Platelets 199  150 - 400 K/uL   Neutrophils Relative % 65  43 - 77 %   Neutro Abs 5.8  1.7 - 7.7 K/uL   Lymphocytes Relative 24  12 - 46 %   Lymphs Abs 2.2  0.7 - 4.0 K/uL   Monocytes Relative 8  3 - 12 %    Monocytes Absolute 0.7  0.1 - 1.0 K/uL   Eosinophils Relative 3  0 - 5 %   Eosinophils Absolute 0.3  0.0 - 0.7 K/uL   Basophils Relative 1  0 - 1 %   Basophils Absolute 0.1  0.0 - 0.1 K/uL  BASIC METABOLIC PANEL     Status: Abnormal   Collection Time    06/29/13  7:11 AM      Result Value Range   Sodium 140  135 - 145 mEq/L   Potassium 3.7  3.5 - 5.1 mEq/L   Chloride 102  96 - 112 mEq/L  CO2 28  19 - 32 mEq/L   Glucose, Bld 120 (*) 70 - 99 mg/dL   BUN 18  6 - 23 mg/dL   Creatinine, Ser 1.61  0.50 - 1.35 mg/dL   Calcium 9.4  8.4 - 09.6 mg/dL   GFR calc non Af Amer 62 (*) >90 mL/min   GFR calc Af Amer 71 (*) >90 mL/min   Comment: (NOTE)     The eGFR has been calculated using the CKD EPI equation.     This calculation has not been validated in all clinical situations.     eGFR's persistently <90 mL/min signify possible Chronic Kidney     Disease.  SEDIMENTATION RATE     Status: None   Collection Time    06/29/13  7:11 AM      Result Value Range   Sed Rate 2  0 - 16 mm/hr  POCT I-STAT TROPONIN I     Status: None   Collection Time    06/29/13  7:19 AM      Result Value Range   Troponin i, poc 0.03  0.00 - 0.08 ng/mL   Comment 3            Comment: Due to the release kinetics of cTnI,     a negative result within the first hours     of the onset of symptoms does not rule out     myocardial infarction with certainty.     If myocardial infarction is still suspected,     repeat the test at appropriate intervals.   EKG: nsr, no acute ischemic changes, normal rate, normal axis, normal QRS, normal ST-T segments.   CT head: NAICP.   MDM   Patient with positional vertigo and right sided headache with history of recent vision loss right eye. The patient stated that he was started on Eliquis when discharged from this hospital last month. Head CT performed to rule out ICH and is negative for acute intracranial process. Patient with unremarkable labs. We have ruled out temporal  arteritis with normal ESR. Patient is still mildly vertiginous but, denies headache. Able to ambulate without trouble. Asking to go home and stable for d/c with plan to f/u with PCP as scheduled.   Brandt Loosen, MD 07/03/13 0454  Brandt Loosen, MD 07/10/13 [phone]

## 2013-06-29 NOTE — ED Notes (Signed)
Pt back from CT

## 2013-06-29 NOTE — ED Notes (Signed)
While doing orthostatic vital signs the pt became dizzy during the sitting and standing vitals. Pt denies feeling like he is going to pass out. Pt also stated he was ok as long as he didn't move his head at all.

## 2013-07-01 ENCOUNTER — Other Ambulatory Visit: Payer: Self-pay

## 2014-01-03 ENCOUNTER — Other Ambulatory Visit: Payer: Self-pay | Admitting: Interventional Cardiology

## 2014-02-08 ENCOUNTER — Encounter: Payer: Self-pay | Admitting: Interventional Cardiology

## 2014-02-08 ENCOUNTER — Ambulatory Visit (INDEPENDENT_AMBULATORY_CARE_PROVIDER_SITE_OTHER): Payer: Medicare PPO | Admitting: Interventional Cardiology

## 2014-02-08 VITALS — BP 130/60 | HR 60 | Ht 66.0 in | Wt 160.0 lb

## 2014-02-08 DIAGNOSIS — I4891 Unspecified atrial fibrillation: Secondary | ICD-10-CM

## 2014-02-08 DIAGNOSIS — I10 Essential (primary) hypertension: Secondary | ICD-10-CM

## 2014-02-08 DIAGNOSIS — R001 Bradycardia, unspecified: Secondary | ICD-10-CM

## 2014-02-08 DIAGNOSIS — I498 Other specified cardiac arrhythmias: Secondary | ICD-10-CM

## 2014-02-08 DIAGNOSIS — H539 Unspecified visual disturbance: Secondary | ICD-10-CM

## 2014-02-08 NOTE — Progress Notes (Signed)
Patient ID: Stephen Hendrix, male   DOB: 1929-01-09, 80 y.o.   MRN: [id number] Patient ID: Stephen Hendrix, male   DOB: Mar 21, 1929, 80 y.o.   MRN: [id number]     Patient ID: Stephen Hendrix MRN: [id number] DOB/AGE: 1928/10/26 80 y.o.   Referring Physician Dr. Maryland Pink   Reason for Visit: AFib  HPI: 80 y/o who has had AFib for at least 10 years. He spends 6 months of the year here and 6 months in [place].  Diagnosed with AFib after palpitations.  No cardioversions and ever took Coumadin.  He was taking Plavix for several years, 3 x /week, due to carotid disease.  He did not have any documented atrial fibrillation while in the hospital. He is sent here for further evaluation of his atrial fibrillation. He has a cardiologist in [place] and he is checked regularly there. He has been on amiodarone for he thinks at least 10-15 years. He has blood work regularly with his regular cardiologist. I presume this includes thyroid and liver tests.  Vision has improved in the right eye.  He has returned from [place] a few weeks ago. No bleeding issues on the Eliquis. No palpitations.  He feels better on Eliquis compared to PLavix.   He continues to walk regularly without any symptoms of chest discomfort or shortness of breath. He plays golf twice a week.   Current Outpatient Prescriptions  Medication Sig Dispense Refill  . amiodarone (PACERONE) 200 MG tablet Take 200 mg by mouth daily.      Marland Kitchen amLODipine (NORVASC) 5 MG tablet Take 5 mg by mouth daily.      Marland Kitchen atorvastatin (LIPITOR) 40 MG tablet Take 1 tablet (40 mg total) by mouth daily.  30 tablet  0  . ELIQUIS 5 MG TABS tablet TAKE 1 TABLET BY MOUTH TWICE DAILY  60 tablet  0  . eplerenone (INSPRA) 25 MG tablet Take 25 mg by mouth daily.      Marland Kitchen losartan (COZAAR) 50 MG tablet Take 50 mg by mouth daily.       No current facility-administered medications for this visit.   Past Medical History  Diagnosis Date  . Hypertension   . Whooping cough   . Atrial  fibrillation   . Pneumonia   . TIA (transient ischemic attack)     Family History  Problem Relation Age of Onset  . CAD Father   . Colon cancer Other     History   Social History  . Marital Status: Married    Spouse Name: N/A    Number of Children: N/A  . Years of Education: N/A   Occupational History  . Not on file.   Social History Main Topics  . Smoking status: Never Smoker   . Smokeless tobacco: Not on file  . Alcohol Use: 1.2 oz/week    2 Glasses of wine per week  . Drug Use: No  . Sexual Activity: No   Other Topics Concern  . Not on file   Social History Narrative  . No narrative on file    Past Surgical History  Procedure Laterality Date  . Tonsillectomy        (Not in a hospital admission)  Review of systems complete and found to be negative unless listed above .  No nausea, vomiting.  No fever chills, No focal weakness,  No palpitations. Blurry vision. He is not sure if this is partially related to a cataract.  Physical Exam: Filed Vitals:   02/08/14  0751  BP: 130/60  Pulse: 60    Weight: 160 lb (72.576 kg)  Physical exam:  [place]/AT EOMI No JVD, No carotid bruit RRR [id number] , 2/6 systolic murmur No wheezing Soft. NT, nondistended No edema. No focal motor or sensory deficits Normal affect  Labs:   Lab Results  Component Value Date   WBC 9.0 06/29/2013   HGB 16.6 06/29/2013   HCT 46.3 06/29/2013   MCV 90.8 06/29/2013   PLT 199 06/29/2013   No results found for this basename: NA, K, CL, CO2, BUN, CREATININE, CALCIUM, LABALBU, PROT, BILITOT, ALKPHOS, ALT, AST, GLUCOSE,  in the last 168 hours Lab Results  Component Value Date   TROPONINI <0.30 06/03/2013    Lab Results  Component Value Date   CHOL 150 06/04/2013   Lab Results  Component Value Date   HDL 55 06/04/2013   Lab Results  Component Value Date   LDLCALC 82 06/04/2013   Lab Results  Component Value Date   TRIG 65 06/04/2013   Lab Results  Component Value Date   CHOLHDL 2.7  06/04/2013   No results found for this basename: LDLDIRECT      Radiology:Normal chest xray. Prior EKG:NSR, IRBBB, no ST segment changes  ASSESSMENT AND PLAN:  1.  AFib: In NSR.  Continue Amiodarone.  Continue Eliquis.  High risk of stroke based on CHADS score.  Stopped aspirin.  Embolism to eye could have been from AFib vs. Plaque embolization.  He has severe external carotid disease bilaterally.  No indication for CEA.   2.  prior echocardiogram did not reveal any source of embolism. Lipids well controlled.  Now needs right cataract removed. 3. Hypertension: Well controlled today. He should watch that at home. Continue ARB. His son is an Magazine features editor in [place].  4. Bradycardia: He states that his heart rate at home can be in the 40s to 50s. He has not had any significant lightheadedness or syncope. His only rate slowing medicines amiodarone. He'll let us know if any of these symptoms occur.  He states that he had a full lab panel in [place] several weeks ago. He will have these results sent to our office. They included cholesterol, thyroid, kidney function tests. He thinks a full panel was done including blood count. He would prefer not to repeat the labs today. Signed:   Mina Marble, MD, [facility] 02/08/2014, 8:07 AM

## 2014-02-08 NOTE — Patient Instructions (Signed)
Please fax most recent labwork to [phone].  Call to schedule an appt in January of 2016.

## 2014-02-23 ENCOUNTER — Ambulatory Visit: Payer: Self-pay | Admitting: Ophthalmology

## 2014-02-23 LAB — POTASSIUM: POTASSIUM: 4.1 mmol/L (ref 3.5–5.1)

## 2014-02-28 ENCOUNTER — Ambulatory Visit: Payer: Self-pay | Admitting: Ophthalmology

## 2014-04-28 ENCOUNTER — Ambulatory Visit: Payer: Medicare PPO | Admitting: Interventional Cardiology

## 2014-12-17 NOTE — Op Note (Signed)
PATIENT NAME:  Stephen Hendrix, Stephen Hendrix MR#:  [id number] DATE OF BIRTH:  [birth year]  DATE OF PROCEDURE:  02/28/2014  PREOPERATIVE DIAGNOSIS: Cataract, right eye.   POSTOPERATIVE DIAGNOSIS: Cataract, right eye.   PROCEDURE PERFORMED: Extracapsular cataract extraction using phacoemulsification with placement Alcon [id number], 17.0 diopter posterior chamber lens, serial number [id number].   SURGEON: Loura Back. Yash Cacciola, MD   ANESTHESIA: 4% lidocaine and 0.75% Marcaine in a 50-50 mixture with 10 units/mL of Hylenex added, given as a peribulbar.   ANESTHESIOLOGIST: Dr. Marcello Moores.   COMPLICATIONS: None.   ESTIMATED BLOOD LOSS: Less than 1 mL.   DESCRIPTION OF PROCEDURE: The patient was brought to the Operating Room and given IV sedation and peribulbar block. He was then prepped and draped in the usual fashion. Vertical rectus muscles were imbricated using 5-0 silk sutures as bridle sutures. The limbus was cauterized superiorly and a partial-thickness scleral groove was made with a #64 Beaver blade. This was dissected anteriorly into clear cornea with an Alcon crescent knife. The anterior chamber was entered superonasally and superotemporally through clear cornea with a paracentesis knife. Air was used to place the aqueous. VisionBlue was then used to paint the anterior capsule. Balanced salt was used to replace the air and the anterior chamber was entered through the lamellar dissection with a 2.6 mm keratome. DisCoVisc was used to place the aqueous and a continuous tear circular capsulorrhexis was carried out. Hydrodissection was used to loosen the nucleus and phacoemulsification was carried out in a divide-and-conquer technique. Total ultrasound time was 2 minutes and 2 seconds with an average power of 20.2% and a CDE of 41.02. Irrigation and aspiration was used to remove the residual cortex. The capsular bag was inflated with DisCoVisc and the intraocular lens was inserted in the capsular bag using an  AcrySof delivery system. Irrigation and aspiration was used to remove the residual DisCoVisc and the wound was inflated with balanced salt. The wound was checked for leaks. None were found. Miostat was injected via the paracentesis tract and 0.01 mL of Vigamox containing 0.1 mg of drug was injected via the paracentesis tract. Wound again was checked for leaks. None were found. The bridle sutures were removed and 2 drops of Vigamox were placed in the eye. A shield was placed on the eye and the patient was discharged to the recovery room in good condition.    ____________________________ Loura Back Aerie Donica, MD sad:lm D: 02/28/2014 13:44:55 ET T: 03/01/2014 01:04:38 ET JOB#: [id number]  cc: Remo Lipps A. Kevonte Vanecek, MD, <Dictator> Martie Lee MD ELECTRONICALLY SIGNED 03/07/2014 12:51

## 2015-01-11 ENCOUNTER — Encounter (HOSPITAL_BASED_OUTPATIENT_CLINIC_OR_DEPARTMENT_OTHER): Payer: Self-pay | Admitting: Emergency Medicine

## 2015-01-11 ENCOUNTER — Emergency Department (HOSPITAL_BASED_OUTPATIENT_CLINIC_OR_DEPARTMENT_OTHER): Payer: Medicare PPO

## 2015-01-11 ENCOUNTER — Emergency Department (HOSPITAL_BASED_OUTPATIENT_CLINIC_OR_DEPARTMENT_OTHER)
Admission: EM | Admit: 2015-01-11 | Discharge: 2015-01-11 | Disposition: A | Discharge status: Home / Self Care | Payer: Medicare PPO | Attending: Emergency Medicine | Admitting: Emergency Medicine

## 2015-01-11 DIAGNOSIS — Y9389 Activity, other specified: Secondary | ICD-10-CM | POA: Insufficient documentation

## 2015-01-11 DIAGNOSIS — S93602A Unspecified sprain of left foot, initial encounter: Secondary | ICD-10-CM

## 2015-01-11 DIAGNOSIS — X58XXXA Exposure to other specified factors, initial encounter: Secondary | ICD-10-CM | POA: Diagnosis not present

## 2015-01-11 DIAGNOSIS — Y9289 Other specified places as the place of occurrence of the external cause: Secondary | ICD-10-CM | POA: Insufficient documentation

## 2015-01-11 DIAGNOSIS — Z79899 Other long term (current) drug therapy: Secondary | ICD-10-CM | POA: Insufficient documentation

## 2015-01-11 DIAGNOSIS — Z880 Allergy status to penicillin: Secondary | ICD-10-CM | POA: Insufficient documentation

## 2015-01-11 DIAGNOSIS — Y998 Other external cause status: Secondary | ICD-10-CM | POA: Diagnosis not present

## 2015-01-11 DIAGNOSIS — I10 Essential (primary) hypertension: Secondary | ICD-10-CM | POA: Diagnosis not present

## 2015-01-11 DIAGNOSIS — I4891 Unspecified atrial fibrillation: Secondary | ICD-10-CM | POA: Diagnosis not present

## 2015-01-11 DIAGNOSIS — Z8701 Personal history of pneumonia (recurrent): Secondary | ICD-10-CM | POA: Diagnosis not present

## 2015-01-11 DIAGNOSIS — S99922A Unspecified injury of left foot, initial encounter: Secondary | ICD-10-CM | POA: Diagnosis present

## 2015-01-11 DIAGNOSIS — Z8673 Personal history of transient ischemic attack (TIA), and cerebral infarction without residual deficits: Secondary | ICD-10-CM | POA: Diagnosis not present

## 2015-01-11 NOTE — ED Provider Notes (Signed)
CSN: [id number]     Arrival date & time 01/11/15  1904 History  This chart was scribed for Stephen Arthurs, MD by Erling Conte, ED Scribe. This patient was seen in room MH06/MH06 and the patient's care was started at 7:15 PM.    Chief Complaint  Patient presents with  . Foot Pain    left    The history is provided by the patient. No language interpreter was used.    HPI Comments: Stephen Hendrix is a 81 y.o. male who presents to the Emergency Department complaining of constant, moderate, gradually worsening, "burning", left foot pain for 1 week. He is complaining of associated swelling to the left foot. Pt states he was already experiencing pain in the foot for 2 days but notes he pushed himself a lot over the weekend and was on his feet all day which may have exacerbated the pain. He reports that he has pain when he bears weight on it but is able to bear weight. He denies any h/o gout. Pt took 2 Tylenol tablets today with no relief. Pt regularly takes Eliquis for his atrial fibrillation. He denies any h/o DVT. He denies any injury or fall.   Past Medical History  Diagnosis Date  . Hypertension   . Whooping cough   . Atrial fibrillation   . Pneumonia   . TIA (transient ischemic attack)    Past Surgical History  Procedure Laterality Date  . Tonsillectomy    . Eye surgery     Family History  Problem Relation Age of Onset  . CAD Father   . Colon cancer Other    History  Substance Use Topics  . Smoking status: Never Smoker   . Smokeless tobacco: Not on file  . Alcohol Use: 1.2 oz/week    2 Glasses of wine per week    Review of Systems  Musculoskeletal: Positive for joint swelling and arthralgias.  All other systems reviewed and are negative.     Allergies  Methylprednisolone; Penicillins; and Cortisone  Home Medications   Prior to Admission medications   Medication Sig Start Date End Date Taking? Authorizing Provider  amiodarone (PACERONE) 200 MG tablet Take 200  mg by mouth daily.    Historical Provider, MD  amLODipine (NORVASC) 5 MG tablet Take 5 mg by mouth daily.    Historical Provider, MD  atorvastatin (LIPITOR) 40 MG tablet Take 1 tablet (40 mg total) by mouth daily. 06/04/13   Annita Brod, MD  ELIQUIS 5 MG TABS tablet TAKE 1 TABLET BY MOUTH TWICE DAILY 01/03/14   Jettie Booze, MD  eplerenone (INSPRA) 25 MG tablet Take 25 mg by mouth daily.    Historical Provider, MD  losartan (COZAAR) 50 MG tablet Take 50 mg by mouth daily.    Historical Provider, MD   Triage Vitals: BP 168/62 mmHg  Pulse 79  Temp(Src) 98.4 F (36.9 C) (Oral)  Resp 18  Ht 5\' 6"  (1.676 m)  Wt 165 lb (74.844 kg)  BMI 26.64 kg/m2  SpO2 98%  Physical Exam  Constitutional: He is oriented to person, place, and time. He appears well-developed and well-nourished. No distress.  HENT:  Head: Normocephalic and atraumatic.  Eyes: Conjunctivae and EOM are normal.  Neck: Neck supple. No tracheal deviation present.  Cardiovascular: Normal rate.   Pulses:      Dorsalis pedis pulses are 2+ on the right side, and 2+ on the left side.  Pulmonary/Chest: Effort normal. No respiratory distress.  Musculoskeletal:  Normal range of motion.       Left ankle: No tenderness.       Left foot: There is tenderness.  Some swelling on L ankle but no tenderness. Tenderness on dorsal aspect of left foot. Mild tenderness to base of 5th metatarsal. Able to wiggle all toes. Neurovascular intact. 2+ pulses   Neurological: He is alert and oriented to person, place, and time.  Skin: Skin is warm and dry.  Psychiatric: He has a normal mood and affect. His behavior is normal.  Nursing note and vitals reviewed.   ED Course  Procedures (including critical care time)  DIAGNOSTIC STUDIES: Oxygen Saturation is 98% on RA, normal by my interpretation.    COORDINATION OF CARE: 7:26 PM- Will order x-ray of left foot and x-ray of left ankle. Pt declined Motrin due to taking Eliquis daily for his  a-fib. Pt advised of plan for treatment and pt agrees.  Labs Review Labs Reviewed - No data to display  Imaging Review Dg Foot Complete Left  01/11/2015   CLINICAL DATA:  Left foot and ankle pain for 1 week. No known injury. Initial encounter.  EXAM: LEFT FOOT - COMPLETE 3+ VIEW  COMPARISON:  None.  FINDINGS: No acute bony or joint abnormality is identified. Mild appearing first MTP degenerative change is noted. Soft tissues are unremarkable.  IMPRESSION: No acute abnormality.  Mild first MTP osteoarthritis.   Electronically Signed   By: Inge Rise M.D.   On: 01/11/2015 19:46     EKG Interpretation None      MDM   Final diagnoses:  None   [name] is a 81 y.o. male here with foot pain. No fall or injury. Xray showed no fracture. Likely sprain. Recommend ace wrap, cane.    I personally performed the services described in this documentation, which was scribed in my presence. The recorded information has been reviewed and is accurate.     Stephen Arthurs, MD 01/11/15 2016

## 2015-01-11 NOTE — ED Notes (Signed)
MD at bedside. 

## 2015-01-11 NOTE — ED Notes (Signed)
Pt states for about a week his ankle has been hurting, denies any injury

## 2015-01-11 NOTE — ED Notes (Signed)
Pt and wife state that they have talked to Dr. Darl Householder who instructed the pt to use a cane. Pt does not want to wait for his d/c paperwork as he has somewhere to be.

## 2015-01-11 NOTE — ED Notes (Signed)
Patient transported to X-ray 

## 2015-01-11 NOTE — Discharge Instructions (Signed)
Continue tylenol for pain.   Elevate your leg and use a cane.   Ace wrap your foot for comfort.   See your doctor.   Return to ER if you have severe pain, unable to bear weight on your left foot.

## 2015-05-02 ENCOUNTER — Encounter: Payer: Self-pay | Admitting: *Deleted

## 2015-05-03 NOTE — Progress Notes (Signed)
Cardiology Office Note   Date:  05/04/2015   ID:  Stephen Hendrix, DOB 05/15/1929, MRN [id number]  PCP:  [place], BETTY G, MD  Cardiologist:  Dr. Casandra Doffing   Electrophysiologist:  n/a  Chief Complaint  Patient presents with  . Atrial Fibrillation     History of Present Illness: Stephen Hendrix is a 81 y.o. [place] male with a hx of PAF controlled on Amiodarone, carotid stenosis, HTN, HL, prior TIA.  He spends 6 mos in [place] and 6 mos in [place].  He also sees Cardiology in [place].  He was started on Eliquis in 05/2013 for stroke prophylaxis.  CHADS2-VASc=5.   Last seen 6/15.  Here for FU.  Returns for FU.  Here alone.  Leaving for [place] next week.  Will see his Dr soon after returning and will get labs.  He has occasional dyspnea after bending over.  Otherwise, he is doing well. The patient denies chest pain, shortness of breath, syncope, orthopnea, PND or significant pedal edema.  He exercises, plays golf.     Studies/Reports Reviewed Today:  Echo 10/14 Mild concentric LVH, vigorous LVF, EF 65-70%, normal wall motion, grade 1 diastolic dysfunction, mild AI  Carotid US 10/14 Bilateral ICA 1-39%    Past Medical History  Diagnosis Date  . Hypertension   . Whooping cough   . Atrial fibrillation   . Pneumonia   . TIA (transient ischemic attack)     Past Surgical History  Procedure Laterality Date  . Tonsillectomy    . Eye surgery       Current Outpatient Prescriptions  Medication Sig Dispense Refill  . amiodarone (PACERONE) 200 MG tablet Take 200 mg by mouth daily.    Marland Kitchen amLODipine (NORVASC) 5 MG tablet Take 5 mg by mouth daily.    . Ascorbic Acid (VITAMIN C) 1000 MG tablet Take 1,000 mg by mouth 2 (two) times daily.    Marland Kitchen atorvastatin (LIPITOR) 40 MG tablet Take 1 tablet (40 mg total) by mouth daily. 30 tablet 0  . Calcium Carbonate-Vit D-Min (CALCIUM 1200 PO) Take 1,200 mg by mouth 2 (two) times daily.    . Cholecalciferol (VITAMIN D3) 10000 UNITS capsule Take  10,000 Units by mouth 2 (two) times daily.    . Cyanocobalamin (VITAMIN B-12 PO) Take 5,000 mcg by mouth daily.    Marland Kitchen ELIQUIS 5 MG TABS tablet TAKE 1 TABLET BY MOUTH TWICE DAILY 60 tablet 0  . eplerenone (INSPRA) 25 MG tablet Take 25 mg by mouth daily.    . Ginger, Zingiber officinalis, (GINGER ROOT) 550 MG CAPS Take 550 mg by mouth daily.    Marland Kitchen losartan (COZAAR) 50 MG tablet Take 50 mg by mouth daily.    . Magnesium 500 MG CAPS Take 500 mg by mouth 2 (two) times daily.    . Melatonin 10 MG TABS Take 10 mg by mouth at bedtime.    . Misc Natural Products (GLUCOSAMINE CHONDROITIN TRIPLE PO) Take 1 tablet by mouth 2 (two) times daily.    . Zinc 50 MG TABS Take 50 mg by mouth daily.     No current facility-administered medications for this visit.    Allergies:   Corticosteroids; Methylprednisolone; Avocado; Penicillins; and Cortisone    Social History:  The patient  reports that he has never smoked. He does not have any smokeless tobacco history on file. He reports that he drinks about 1.2 oz of alcohol per week. He reports that he does not use illicit  drugs.   Family History:  The patient's family history includes CAD in his father; Colon cancer in his other.    ROS:   Please see the history of present illness.   Review of Systems  Hematologic/Lymphatic: Bruises/bleeds easily.  Musculoskeletal: Positive for back pain.  Gastrointestinal: Positive for constipation.  All other systems reviewed and are negative.     PHYSICAL EXAM: VS:  BP 140/60 mmHg  Pulse 70  Ht 5\' 6"  (1.676 m)  Wt 175 lb 6.4 oz (79.561 kg)  BMI 28.32 kg/m2    Wt Readings from Last 3 Encounters:  05/04/15 175 lb 6.4 oz (79.561 kg)  01/11/15 165 lb (74.844 kg)  02/08/14 160 lb (72.576 kg)     GEN: Well nourished, well developed, in no acute distress HEENT: normal Neck: no JVD, no carotid bruits, no masses Cardiac:  Normal S1/S2, RRR; 1/6 short systolic murmur RUSB,  no rubs or gallops, trace bilateral LE edema    Respiratory:  clear to auscultation bilaterally, no wheezing, rhonchi or rales. GI: soft, nontender, nondistended, + BS MS: no deformity or atrophy Skin: warm and dry  Neuro:  CNs II-XII intact, Strength and sensation are intact Psych: Normal affect   EKG:  EKG is ordered today.  It demonstrates:   NSR, HR 70, normal axis, NSSTTW changes, QTc 483 ms, no significant change when compared to prior tracings.     Recent Labs: No results found for requested labs within last 365 days.  Labs (from [place]) 4/15:  K 4.4, Cr 1.21, ALT 36, TC 124, TG 107,HDL 47, LDL 55.6, TSH 6.37   Lipid Panel    Component Value Date/Time   CHOL 150 06/04/2013 0615   TRIG 65 06/04/2013 0615   HDL 55 06/04/2013 0615   CHOLHDL 2.7 06/04/2013 0615   VLDL 13 06/04/2013 0615   LDLCALC 82 06/04/2013 0615      ASSESSMENT AND PLAN:   Paroxysmal Atrial Fibrillation:  Maintaining NSR.  He is tolerating Eliquis, Amiodarone.  Continue current Rx.  Labs followed by cardiologist in [place].  Will make sure he gets BMET, TSH, LFTs, CBC when he returns next week.  HTN:  Controlled.  BPs at home 110/60s.    Hyperlipidemia:  Continue statin.  Labs followed by PCP.   Hx of TIA:  Continue Eliquis, statin.      Medication Changes: Current medicines are reviewed at length with the patient today.  Concerns regarding medicines are as outlined above.  The following changes have been made:   Discontinued Medications   No medications on file   Modified Medications   No medications on file   New Prescriptions   No medications on file    Labs/ tests ordered today include:   Orders Placed This Encounter  Procedures  . EKG 12-Lead      Disposition:    FU with Dr. Casandra Doffing 02/2016   Signed, Richardson Dopp, PA-C, MHS 05/04/2015 2:50 PM    [facility] [address], [place], [place]  [address] Phone: [phone]; Fax: [phone]

## 2015-05-04 ENCOUNTER — Ambulatory Visit (INDEPENDENT_AMBULATORY_CARE_PROVIDER_SITE_OTHER): Payer: Medicare PPO | Admitting: Physician Assistant

## 2015-05-04 ENCOUNTER — Encounter: Payer: Self-pay | Admitting: Physician Assistant

## 2015-05-04 VITALS — BP 140/60 | HR 70 | Ht 66.0 in | Wt 175.4 lb

## 2015-05-04 DIAGNOSIS — E785 Hyperlipidemia, unspecified: Secondary | ICD-10-CM

## 2015-05-04 DIAGNOSIS — I10 Essential (primary) hypertension: Secondary | ICD-10-CM

## 2015-05-04 DIAGNOSIS — I480 Paroxysmal atrial fibrillation: Secondary | ICD-10-CM | POA: Diagnosis not present

## 2015-05-04 DIAGNOSIS — G459 Transient cerebral ischemic attack, unspecified: Secondary | ICD-10-CM | POA: Diagnosis not present

## 2015-05-04 NOTE — Patient Instructions (Signed)
Medication Instructions:  - No Change  Labwork: Have your doctor in [place] check bmet/lft/cbc/tsh/ when you get back  Testing/Procedures: -None  Follow-Up: Your physician wants you to follow-up in: 10 months with Dr. Irish Lack.  You will receive a reminder letter in the mail two months in advance. If you don't receive a letter, please call our office to schedule the follow-up appointment.   Any Other Special Instructions Will Be Listed Below (If Applicable).

## 2015-05-08 ENCOUNTER — Encounter (HOSPITAL_BASED_OUTPATIENT_CLINIC_OR_DEPARTMENT_OTHER): Payer: Self-pay | Admitting: *Deleted

## 2015-05-08 ENCOUNTER — Emergency Department (HOSPITAL_BASED_OUTPATIENT_CLINIC_OR_DEPARTMENT_OTHER)
Admission: EM | Admit: 2015-05-08 | Discharge: 2015-05-08 | Disposition: A | Discharge status: Home / Self Care | Payer: Medicare PPO | Attending: Emergency Medicine | Admitting: Emergency Medicine

## 2015-05-08 DIAGNOSIS — Z79899 Other long term (current) drug therapy: Secondary | ICD-10-CM | POA: Diagnosis not present

## 2015-05-08 DIAGNOSIS — R197 Diarrhea, unspecified: Secondary | ICD-10-CM | POA: Diagnosis not present

## 2015-05-08 DIAGNOSIS — Z8673 Personal history of transient ischemic attack (TIA), and cerebral infarction without residual deficits: Secondary | ICD-10-CM | POA: Diagnosis not present

## 2015-05-08 DIAGNOSIS — Z8701 Personal history of pneumonia (recurrent): Secondary | ICD-10-CM | POA: Diagnosis not present

## 2015-05-08 DIAGNOSIS — I4891 Unspecified atrial fibrillation: Secondary | ICD-10-CM | POA: Diagnosis not present

## 2015-05-08 DIAGNOSIS — I10 Essential (primary) hypertension: Secondary | ICD-10-CM | POA: Diagnosis not present

## 2015-05-08 DIAGNOSIS — Z880 Allergy status to penicillin: Secondary | ICD-10-CM | POA: Diagnosis not present

## 2015-05-08 DIAGNOSIS — Z8619 Personal history of other infectious and parasitic diseases: Secondary | ICD-10-CM | POA: Diagnosis not present

## 2015-05-08 LAB — BASIC METABOLIC PANEL
Anion gap: 8 (ref 5–15)
BUN: 25 mg/dL — AB (ref 6–20)
CALCIUM: 9 mg/dL (ref 8.9–10.3)
CO2: 27 mmol/L (ref 22–32)
Chloride: 105 mmol/L (ref 101–111)
Creatinine, Ser: 1.1 mg/dL (ref 0.61–1.24)
GFR calc Af Amer: >60 mL/min (ref >60)
GLUCOSE: 121 mg/dL — AB (ref 65–99)
Potassium: 3.3 mmol/L — ABNORMAL LOW (ref 3.5–5.1)
Sodium: 140 mmol/L (ref 135–145)

## 2015-05-08 LAB — CBC WITH DIFFERENTIAL/PLATELET
Basophils Absolute: 0 10*3/uL (ref 0.0–0.1)
Basophils Relative: 0 % (ref 0–1)
EOS ABS: 0.4 10*3/uL (ref 0.0–0.7)
Eosinophils Relative: 3 % (ref 0–5)
HCT: 45.7 % (ref 39.0–52.0)
Hemoglobin: 15.6 g/dL (ref 13.0–17.0)
LYMPHS ABS: 3.4 10*3/uL (ref 0.7–4.0)
Lymphocytes Relative: 28 % (ref 12–46)
MCH: 30.9 pg (ref 26.0–34.0)
MCHC: 34.1 g/dL (ref 30.0–36.0)
MCV: 90.5 fL (ref 78.0–100.0)
Monocytes Absolute: 1.4 10*3/uL — ABNORMAL HIGH (ref 0.1–1.0)
Monocytes Relative: 12 % (ref 3–12)
NEUTROS PCT: 57 % (ref 43–77)
Neutro Abs: 6.8 10*3/uL (ref 1.7–7.7)
PLATELETS: 214 10*3/uL (ref 150–400)
RBC: 5.05 MIL/uL (ref 4.22–5.81)
RDW: 13.3 % (ref 11.5–15.5)
WBC: 12 10*3/uL — ABNORMAL HIGH (ref 4.0–10.5)

## 2015-05-08 MED ORDER — DIPHENOXYLATE-ATROPINE 2.5-0.025 MG PO TABS
2.0000 | ORAL_TABLET | Freq: Once | ORAL | Status: AC
  Administered 2015-05-08: 2 via ORAL
  Filled 2015-05-08: qty 2

## 2015-05-08 MED ORDER — DIPHENOXYLATE-ATROPINE 2.5-0.025 MG PO TABS: 1.0000 | ORAL_TABLET | Freq: Four times a day (QID) | ORAL | Status: DC | PRN

## 2015-05-08 MED ORDER — METRONIDAZOLE 500 MG PO TABS: 500.0000 mg | ORAL_TABLET | Freq: Two times a day (BID) | ORAL | Status: DC

## 2015-05-08 MED ORDER — METRONIDAZOLE 500 MG PO TABS
500.0000 mg | ORAL_TABLET | Freq: Once | ORAL | Status: AC
  Administered 2015-05-08: 500 mg via ORAL
  Filled 2015-05-08: qty 1

## 2015-05-08 NOTE — ED Provider Notes (Signed)
CSN: [id number]     Arrival date & time 05/08/15  1112 History   First MD Initiated Contact with Patient 05/08/15 1139     Chief Complaint  Patient presents with  . Diarrhea      HPI  Patient resists evaluation of diarrhea. Symptoms 12 days. Initially had mucousy watery stools for 3-4 days 4-5 episodes per day. This improved and was only having one or 2 episodes a simple soft stools. Lives in [place] half the year in the [place] the other half. Was scheduled to fly back today. Headache watery mucousy stool last night. Canceled his travel plans today. Had a softer more normal stool this morning but presents here. Lives in [place]. They have well water. No other ill family members. No pet exposures. No foreign travel. No new medications. No new recent antibiotics or hospitalizations.  Past Medical History  Diagnosis Date  . Hypertension   . Whooping cough   . Atrial fibrillation   . Pneumonia   . TIA (transient ischemic attack)    Past Surgical History  Procedure Laterality Date  . Tonsillectomy    . Eye surgery     Family History  Problem Relation Age of Onset  . CAD Father   . Colon cancer Other    Social History  Substance Use Topics  . Smoking status: Never Smoker   . Smokeless tobacco: None  . Alcohol Use: 1.2 oz/week    2 Glasses of wine per week    Review of Systems  Constitutional: Negative for fever, chills, diaphoresis, appetite change and fatigue.  HENT: Negative for mouth sores, sore throat and trouble swallowing.   Eyes: Negative for visual disturbance.  Respiratory: Negative for cough, chest tightness, shortness of breath and wheezing.   Cardiovascular: Negative for chest pain.  Gastrointestinal: Positive for diarrhea. Negative for nausea, vomiting, abdominal pain and abdominal distention.  Endocrine: Negative for polydipsia, polyphagia and polyuria.  Genitourinary: Negative for dysuria, frequency and hematuria.  Musculoskeletal: Negative for  gait problem.  Skin: Negative for color change, pallor and rash.  Neurological: Negative for dizziness, syncope, light-headedness and headaches.  Hematological: Does not bruise/bleed easily.  Psychiatric/Behavioral: Negative for behavioral problems and confusion.       Allergies  Corticosteroids; Methylprednisolone; Avocado; Penicillins; and Cortisone  Home Medications   Prior to Admission medications   Medication Sig Start Date End Date Taking? Authorizing Provider  amiodarone (PACERONE) 200 MG tablet Take 200 mg by mouth daily.    Historical Provider, MD  amLODipine (NORVASC) 5 MG tablet Take 5 mg by mouth daily.    Historical Provider, MD  Ascorbic Acid (VITAMIN C) 1000 MG tablet Take 1,000 mg by mouth 2 (two) times daily.    Historical Provider, MD  atorvastatin (LIPITOR) 40 MG tablet Take 1 tablet (40 mg total) by mouth daily. 06/04/13   Annita Brod, MD  Calcium Carbonate-Vit D-Min (CALCIUM 1200 PO) Take 1,200 mg by mouth 2 (two) times daily.    Historical Provider, MD  Cholecalciferol (VITAMIN D3) 10000 UNITS capsule Take 10,000 Units by mouth 2 (two) times daily.    Historical Provider, MD  Cyanocobalamin (VITAMIN B-12 PO) Take 5,000 mcg by mouth daily.    Historical Provider, MD  diphenoxylate-atropine (LOMOTIL) 2.5-0.025 MG per tablet Take 1 tablet by mouth 4 (four) times daily as needed for diarrhea or loose stools. 05/08/15   Tanna Furry, MD  ELIQUIS 5 MG TABS tablet TAKE 1 TABLET BY MOUTH TWICE DAILY 01/03/14  Jettie Booze, MD  eplerenone (INSPRA) 25 MG tablet Take 25 mg by mouth daily.    Historical Provider, MD  Ginger, Zingiber officinalis, (GINGER ROOT) 550 MG CAPS Take 550 mg by mouth daily.    Historical Provider, MD  losartan (COZAAR) 50 MG tablet Take 50 mg by mouth daily.    Historical Provider, MD  Magnesium 500 MG CAPS Take 500 mg by mouth 2 (two) times daily.    Historical Provider, MD  Melatonin 10 MG TABS Take 10 mg by mouth at bedtime.    Historical  Provider, MD  metroNIDAZOLE (FLAGYL) 500 MG tablet Take 1 tablet (500 mg total) by mouth 2 (two) times daily. 05/08/15   Tanna Furry, MD  Misc Natural Products (GLUCOSAMINE CHONDROITIN TRIPLE PO) Take 1 tablet by mouth 2 (two) times daily.    Historical Provider, MD  Zinc 50 MG TABS Take 50 mg by mouth daily.    Historical Provider, MD   BP 153/64 mmHg  Pulse 65  Temp(Src) 97.9 F (36.6 C) (Oral)  Resp 20  Ht 5\' 6"  (1.676 m)  Wt 164 lb (74.39 kg)  BMI 26.48 kg/m2  SpO2 98% Physical Exam  Constitutional: He is oriented to person, place, and time. He appears well-developed and well-nourished. No distress.  HENT:  Head: Normocephalic.  Eyes: Conjunctivae are normal. Pupils are equal, round, and reactive to light. No scleral icterus.  Neck: Normal range of motion. Neck supple. No thyromegaly present.  Cardiovascular: Normal rate and regular rhythm.  Exam reveals no gallop and no friction rub.   No murmur heard. Pulmonary/Chest: Effort normal and breath sounds normal. No respiratory distress. He has no wheezes. He has no rales.  Abdominal: Soft. Bowel sounds are normal. He exhibits no distension. There is no tenderness. There is no rebound.  Musculoskeletal: Normal range of motion.  Neurological: He is alert and oriented to person, place, and time.  Skin: Skin is warm and dry. No rash noted.  Psychiatric: He has a normal mood and affect. His behavior is normal.    ED Course  Procedures (including critical care time) Labs Review Labs Reviewed  CBC WITH DIFFERENTIAL/PLATELET - Abnormal; Notable for the following:    WBC 12.0 (*)    Monocytes Absolute 1.4 (*)    All other components within normal limits  BASIC METABOLIC PANEL - Abnormal; Notable for the following:    Potassium 3.3 (*)    Glucose, Bld 121 (*)    BUN 25 (*)    All other components within normal limits    Imaging Review No results found. I have personally reviewed and evaluated these images and lab results as part  of my medical decision-making.   EKG Interpretation None      MDM   Final diagnoses:  Diarrhea    Over several hours time patient not having the urge to have, or able to produce a stool. Discussed increasing his potassium at home. Also discussed treatment with Lomotil and Flagyl. He strongly prefers appear treatment rather than additional wait in the emergency room is quite anxious to go and feels well.    Tanna Furry, MD 05/08/15 1349

## 2015-05-08 NOTE — ED Notes (Signed)
Diarrhea x 12 days. No pain.

## 2015-05-08 NOTE — Discharge Instructions (Signed)
Diarrea  (Diarrhea) La diarrea consiste en evacuaciones intestinales frecuentes, blandas o acuosas. Puede hacerlo sentir dbil y deshidratado. La deshidratacin puede hacer que se sienta cansado, sediento, tener la boca seca y que haya disminucin de [place], que a menudo es de color amarillo oscuro. La diarrea es un signo de otro problema, generalmente una infeccin que no durar [employer]. En la [employer], la diarrea dura tpicamente 2 a 3 das. Sin embargo, puede durar ms tiempo si se trata de un signo de algo ms serio. Es importante tratar la diarrea como lo indique su mdico para disminuir o prevenir futuros episodios de Clinical biochemist.  CAUSAS  Algunas causas comunes son:   Infecciones gastrointestinales causadas por virus, bacterias o parsitos.  Intoxicacin alimentaria o alergias a los alimentos.  Ciertos medicamentos, como los antibiticos, quimioterapia y laxantes.  Edulcorantes artificiales y fructosa.  Los trastornos [employer]. INSTRUCCIONES PARA EL CUIDADO EN EL HOGAR   Asegure una adecuada ingesta de lquidos (hidratacin). Evite los lquidos que contengan azcares simples o las bebidas deportivas, los jugos de frutas, los productos derivados de la leche entera y [place]. Si bebe la cantidad suficiente de lquidos, la orina debe ser clara o amarillo plido. Una solucin de rehidratacin oral se puede comprar en las farmacias, en las tiendas minoristas y por Internet. Se puede preparar una solucin de rehidratacin oral casera con los siguientes ingredientes:   - cucharadita de sal.   cucharadita de bicarbonato.   de cucharadita de sal sustituta que contenga cloruro de potasio.  1  cucharada de azcar.  1l (34 onzas) de agua.  Ciertos alimentos y bebidas pueden aumentar la velocidad a la que el alimento se mueve a travs del tracto gastrointestinal (GI). Estos alimentos y bebidas deben evitarse e incluyen:  Bebidas alcohlicas y con cafena.  Alimentos  ricos en fibra, como frutas y verduras, nueces, semillas, panes y cereales integrales.  Alimentos y bebidas endulzados con alcoholes de azcar, tales como xilitol, sorbitol, y manitol.  Algunos alimentos pueden ser bien tolerados y puede ayudar a [employer], incluyendo:  Alimentos con almidn, como arroz, pan, pasta, cereales bajos en azcar, avena, smola de maz, papas al horno, galletas y panecillos.  Bananas.  Pur de [employer].  Agregue alimentos ricos en probiticos a la dieta del nio para ayudar a aumentar las bacterias saludables en el tracto gastrointestinal, como el yogur y productos lcteos fermentados.  Lvese bien las manos despus de cada episodio de diarrea.  Tome slo medicamentos de venta libre o recetados, segn las indicaciones del [place] un bao caliente para ayudar a disminuir ardor o dolor por los episodios frecuentes de diarrea. SOLICITE ATENCIN MDICA DE INMEDIATO SI:   No puede retener los lquidos.  Tiene vmitos persistentes.  Lollie Marrow en la materia fecal, o las heces son negras y de aspecto alquitranado.  No hay emisin de [place] durante 6 a 8 horas o elimina una pequea cantidad de [place].  Tiene dolor abdominal que aumenta o se localiza.  Est muy mareado o se desvanece.  Sufre un dolor intenso de [place].  La diarrea empeora o no mejora.  Tiene fiebre o sntomas que persisten durante ms de 2 o 3 das.  Tiene fiebre y los sntomas empeoran de manera sbita. ASEGRESE DE QUE:   Comprende estas instrucciones.  Controlar su enfermedad.  Solicitar ayuda de inmediato si no mejora o si empeora. Document Released: 08/12/2005 Document Revised: 07/29/2012 [facility] Patient Information 2015 [place]. This  information is not intended to replace advice given to you by your health care provider. Make sure you discuss any questions you have with your health care provider. ° °

## 2016-08-13 ENCOUNTER — Telehealth: Payer: Self-pay | Admitting: Interventional Cardiology

## 2016-08-13 NOTE — Progress Notes (Signed)
CARDIOLOGY OFFICE NOTE  Date:  08/14/2016    Stephen Hendrix Date of Birth: 08-22-1929 Medical Record [id number]  PCP:  Betty Martinique, MD  Cardiologist:  Irish Lack    Chief Complaint  Patient presents with  . Atrial Fibrillation    Work in visit for hematuria - seen for Dr. Irish Lack    History of Present Illness: Stephen Hendrix is a 82 y.o. male who presents today for a work in visit. Seen for Dr. Irish Lack. He spends 6 months in the [place] and 6 months in [place]. He also sees Cardiology in [place].  He has a history of PAF controlled on Amiodarone, carotid stenosis, HTN, HL, and prior TIA.  He was started on Eliquis in 05/2013 for stroke prophylaxis.  CHADS2-VASc=5.   Last seen in September of 2016 and was felt to be doing ok.   Phone call yesterday - "The pt states that he has blood in his urine and was advised by his nephrologist to contact us as he is taking Eliquis 5 mg BID.  He states that this has been going on for a while and is requesting that he be seen soon." I have scheduled him an appt with Truitt Merle, NP for tomorrow at 10 am. He is in agreement with date and time of appt.  Thus added to my schedule for today.   Comes in today. Here with his wife. Now here in the states at last 6 months more due to [place] - they were not there when that took place. Rhythm has been good. Sees Urology in [facility] - has had bladder cancer for the past 7 years - last procedure in July of this year. He has had no chest pain. Not short of breath. No palpitations. Has had prior TIA. He is adamant that he does not wish to have a stroke. He now has hematuria - can see blood in his urine. Seems to be worse if he holds his bladder for too long, walks for long distances or wears his pants too tight. He has stopped his Eliquis and it has resolved - only to recur when he restarts. The amount of bleeding varies but has been heavy. No actual clots noted. He is seeing urology again in early  January and sounds like he will be having a cysto.   Past Medical History:  Diagnosis Date  . Atrial fibrillation ([place])   . Hypertension   . Pneumonia   . TIA (transient ischemic attack)   . Whooping cough     Past Surgical History:  Procedure Laterality Date  . EYE SURGERY    . TONSILLECTOMY       Medications: Current Outpatient Prescriptions  Medication Sig Dispense Refill  . amiodarone (PACERONE) 200 MG tablet Take 200 mg by mouth daily.    Marland Kitchen amLODipine (NORVASC) 5 MG tablet Take 5 mg by mouth daily.    . Ascorbic Acid (VITAMIN C) 1000 MG tablet Take 1,000 mg by mouth 2 (two) times daily.    Marland Kitchen atorvastatin (LIPITOR) 40 MG tablet Take 1 tablet (40 mg total) by mouth daily. 30 tablet 0  . Calcium Carbonate-Vit D-Min (CALCIUM 1200 PO) Take 1,200 mg by mouth 2 (two) times daily.    . Cholecalciferol (VITAMIN D3) 10000 UNITS capsule Take 10,000 Units by mouth 2 (two) times daily.    . Cyanocobalamin (VITAMIN B-12 PO) Take 5,000 mcg by mouth daily.    . diphenoxylate-atropine (LOMOTIL) 2.5-0.025 MG per tablet Take  1 tablet by mouth 4 (four) times daily as needed for diarrhea or loose stools. 30 tablet 0  . ELIQUIS 5 MG TABS tablet TAKE 1 TABLET BY MOUTH TWICE DAILY 60 tablet 0  . eplerenone (INSPRA) 25 MG tablet Take 25 mg by mouth daily.    . Ginger, Zingiber officinalis, (GINGER ROOT) 550 MG CAPS Take 550 mg by mouth daily.    Marland Kitchen losartan (COZAAR) 50 MG tablet Take 50 mg by mouth daily.    . Magnesium 500 MG CAPS Take 500 mg by mouth 2 (two) times daily.    . Melatonin 10 MG TABS Take 10 mg by mouth at bedtime.    . metroNIDAZOLE (FLAGYL) 500 MG tablet Take 1 tablet (500 mg total) by mouth 2 (two) times daily. 20 tablet 0  . Misc Natural Products (GLUCOSAMINE CHONDROITIN TRIPLE PO) Take 1 tablet by mouth 2 (two) times daily.    . Zinc 50 MG TABS Take 50 mg by mouth daily.     No current facility-administered medications for this visit.     Allergies: Allergies  Allergen  Reactions  . Corticosteroids Shortness Of Breath    HICCUPS AND RESPIRATORY DISTRESS  . Methylprednisolone Shortness Of Breath    Pt tolerates po steroids  . Avocado     Other reaction(s): Other (See Comments) ORAL PEELING  . Penicillins   . Cortisone     Hiccups for a week    Social History: The patient  reports that he has never smoked. He does not have any smokeless tobacco history on file. He reports that he drinks about 1.2 oz of alcohol per week . He reports that he does not use drugs.   Family History: The patient's family history includes CAD in his father; Colon cancer in his other.   Review of Systems: Please see the history of present illness.   Otherwise, the review of systems is positive for none.   All other systems are reviewed and negative.   Physical Exam: VS:  BP (!) 152/70   Pulse 66  .  BMI There is no height or weight on file to calculate BMI.  Wt Readings from Last 3 Encounters:  05/08/15 164 lb (74.4 kg)  05/04/15 175 lb 6.4 oz (79.6 kg)  01/11/15 165 lb (74.8 kg)    General: Pleasant. He looks younger than his stated age. He is alert and in no acute distress.   HEENT: Normal.  Neck: Supple, no JVD, carotid bruits, or masses noted.  Cardiac: Regular rate and rhythm. Soft outflow murmur. No edema.  Respiratory:  Lungs are clear to auscultation bilaterally with normal work of breathing.  GI: Soft and nontender.  MS: No deformity or atrophy. Gait and ROM intact.  Skin: Warm and dry. Color is normal.  Neuro:  Strength and sensation are intact and no gross focal deficits noted.  Psych: Alert, appropriate and with normal affect.   LABORATORY DATA:  EKG:  EKG is ordered today. I am NOT able to review his last echo due to EPIC malfunction. Today's EKG demonstrates NSR with 1st degree AV block. Has RBBB as well.   Lab Results  Component Value Date   WBC 12.0 (H) 05/08/2015   HGB 15.6 05/08/2015   HCT 45.7 05/08/2015   PLT 214 05/08/2015   GLUCOSE  121 (H) 05/08/2015   CHOL 150 06/04/2013   TRIG 65 06/04/2013   HDL 55 06/04/2013   LDLCALC 82 06/04/2013   ALT 37 06/03/2013   AST  25 06/03/2013   NA 140 05/08/2015   K 3.3 (L) 05/08/2015   CL 105 05/08/2015   CREATININE 1.10 05/08/2015   BUN 25 (H) 05/08/2015   CO2 27 05/08/2015   TSH 1.819 06/03/2013   INR 0.99 06/03/2013   HGBA1C 5.6 06/04/2013    BNP (last 3 results) No results for input(s): BNP in the last 8760 hours.  ProBNP (last 3 results) No results for input(s): PROBNP in the last 8760 hours.   Other Studies Reviewed Today:  Echo 10/14 Mild concentric LVH, vigorous LVF, EF 65-70%, normal wall motion, grade 1 diastolic dysfunction, mild AI  Carotid US 10/14 Bilateral ICA 1-39%  Assessment/Plan: 1. PAF - on amiodarone and Eliquis - needs follow up lab today.   2. High risk medicine - lab today  3. Hematuria - UA today - explained that Eliquis does not just "cause" hematuria but there is inevitably a problem in his bladder that the Eliquis is certainly exacerbating - he has had bladder tumors removed in the past. I suspect this is the cause.  But we also do not need to have significant bleeding that can cause other issues. He is adamant that he wishes to continue his Eliquis due to his increased risk of stroke and prior TIA history. Will check lab today. See urology as planned and follow up back here for further discussion. For now, no change in his medicines.   3. HTN - better control at home.  Current medicines are reviewed with the patient today.  The patient does not have concerns regarding medicines other than what has been noted above.  The following changes have been made:  See above.  Labs/ tests ordered today include:    Orders Placed This Encounter  Procedures  . Basic metabolic panel  . CBC  . Urinalysis  . EKG 12-Lead     Disposition:   FU with Dr. Irish Lack in 6 months.  Patient is agreeable to this plan and will call if any problems  develop in the interim.   Signed: Burtis Junes, RN, ANP-C 08/14/2016 10:44 AM  [place] [address] [place], [place]  [address] Phone: [phone] Fax: [phone]

## 2016-08-13 NOTE — Telephone Encounter (Signed)
The pt states that he has blood in his urine and was advised by his nephrologist to contact us as he is taking Eliquis 5 mg BID. He states that this has been going on for a while and is requesting that he be seen soon.  I have scheduled him an appt with Truitt Merle, NP for tomorrow at 10 am. He is in agreement with date and time of appt.

## 2016-08-13 NOTE — Telephone Encounter (Signed)
°  New Prob   Pt reports ongoing hematuria x 1 month. Pt states he is followed closely by his Urologist. However, he states Urologist asked him to contact cardiology. Concerned hematuria may be related to one of his medications. Please call.

## 2016-08-14 ENCOUNTER — Ambulatory Visit (INDEPENDENT_AMBULATORY_CARE_PROVIDER_SITE_OTHER): Payer: Medicare PPO | Admitting: Nurse Practitioner

## 2016-08-14 ENCOUNTER — Encounter: Payer: Self-pay | Admitting: Nurse Practitioner

## 2016-08-14 VITALS — BP 152/70 | HR 66

## 2016-08-14 DIAGNOSIS — I480 Paroxysmal atrial fibrillation: Secondary | ICD-10-CM | POA: Diagnosis not present

## 2016-08-14 DIAGNOSIS — R319 Hematuria, unspecified: Secondary | ICD-10-CM | POA: Diagnosis not present

## 2016-08-14 DIAGNOSIS — E7800 Pure hypercholesterolemia, unspecified: Secondary | ICD-10-CM

## 2016-08-14 DIAGNOSIS — Z7901 Long term (current) use of anticoagulants: Secondary | ICD-10-CM

## 2016-08-14 DIAGNOSIS — I10 Essential (primary) hypertension: Secondary | ICD-10-CM

## 2016-08-14 LAB — URINALYSIS
Bilirubin Urine: NEGATIVE
Glucose, UA: NEGATIVE
Ketones, ur: NEGATIVE
Leukocytes, UA: NEGATIVE
Nitrite: NEGATIVE
Specific Gravity, Urine: 1.015 (ref 1.001–1.035)
pH: 5 (ref 5.0–8.0)

## 2016-08-14 LAB — CBC
HCT: 45.2 % (ref 38.5–50.0)
Hemoglobin: 15.4 g/dL (ref 13.2–17.1)
MCH: 31.5 pg (ref 27.0–33.0)
MCHC: 34.1 g/dL (ref 32.0–36.0)
MCV: 92.4 fL (ref 80.0–100.0)
MPV: 10.8 fL (ref 7.5–12.5)
Platelets: 205 10*3/uL (ref 140–400)
RBC: 4.89 MIL/uL (ref 4.20–5.80)
RDW: 13.2 % (ref 11.0–15.0)
WBC: 8.3 10*3/uL (ref 3.8–10.8)

## 2016-08-14 LAB — BASIC METABOLIC PANEL
BUN: 16 mg/dL (ref 7–25)
CO2: 24 mmol/L (ref 20–31)
Calcium: 9.3 mg/dL (ref 8.6–10.3)
Chloride: 106 mmol/L (ref 98–110)
Creat: 1.09 mg/dL (ref 0.70–1.11)
Glucose, Bld: 129 mg/dL — ABNORMAL HIGH (ref 65–99)
Potassium: 3.9 mmol/L (ref 3.5–5.3)
Sodium: 141 mmol/L (ref 135–146)

## 2016-08-14 LAB — HEPATIC FUNCTION PANEL
ALT: 37 U/L (ref 9–46)
AST: 25 U/L (ref 10–35)
Albumin: 4.1 g/dL (ref 3.6–5.1)
Alkaline Phosphatase: 65 U/L (ref 40–115)
Bilirubin, Direct: 0.2 mg/dL (ref <=0.2)
Indirect Bilirubin: 0.6 mg/dL (ref 0.2–1.2)
Total Bilirubin: 0.8 mg/dL (ref 0.2–1.2)
Total Protein: 6 g/dL — ABNORMAL LOW (ref 6.1–8.1)

## 2016-08-14 LAB — TSH: TSH: 9.89 mIU/L — ABNORMAL HIGH (ref 0.40–4.50)

## 2016-08-14 NOTE — Patient Instructions (Addendum)
We will be checking the following labs today - BMET, HPF, TSH and CBC and UA   Medication Instructions:    Continue with your current medicines.     Testing/Procedures To Be Arranged:  N/A  Follow-Up:   See Dr. Irish Lack in about 3 to 4 weeks  See your urologist back as planned for evaluation.     Other Special Instructions:   N/A    If you need a refill on your cardiac medications before your next appointment, please call your pharmacy.   Call the [place] office at [phone] if you have any questions, problems or concerns.

## 2016-08-16 ENCOUNTER — Other Ambulatory Visit: Payer: Self-pay | Admitting: *Deleted

## 2016-08-16 DIAGNOSIS — E039 Hypothyroidism, unspecified: Secondary | ICD-10-CM

## 2016-08-16 MED ORDER — LEVOTHYROXINE SODIUM 25 MCG PO TABS: 25.0000 ug | ORAL_TABLET | Freq: Every day | ORAL | 3 refills | Status: DC

## 2016-09-18 ENCOUNTER — Other Ambulatory Visit: Payer: Self-pay | Admitting: *Deleted

## 2016-09-18 MED ORDER — APIXABAN 5 MG PO TABS: 5.0000 mg | ORAL_TABLET | Freq: Two times a day (BID) | ORAL | 1 refills | Status: DC

## 2016-09-27 ENCOUNTER — Ambulatory Visit: Payer: Medicare PPO | Admitting: Interventional Cardiology

## 2016-10-09 ENCOUNTER — Telehealth: Payer: Self-pay | Admitting: Interventional Cardiology

## 2016-10-09 NOTE — Telephone Encounter (Signed)
New message     Pt daughter is calling.   Pt c/o medication issue:  1. Name of Medication: eliquis  2. How are you currently taking this medication (dosage and times per day)? Twice a day  3. Are you having a reaction (difficulty breathing--STAT)? no  4. What is your medication issue? Pt is having bleeding from his bladder.   Offered daughter appt for pt with Dr. Irish Lack in [place] stated they were going out of the country until June or July in March. Offered an appt with PA-but pt and daughter did not want to see the Pa.

## 2016-10-09 NOTE — Telephone Encounter (Signed)
The pt got on the phone and gave permission for me to speak to his wife on his behalf. The pts wife states that the pt has had blood in his urine since October 2017 and that the pt sees Dr Nevada Crane (Urologist) due to bladder cancer. She states that Dr Nevada Crane told them that as long as the pt is taking Eliquis he will have bleeding so they are calling us stating that the pt needs to stop Eliquis. I explained to them that we have not heard from Dr Nevada Crane concerning the pt taking Eliquis and that I will call his office to get clarification.   I did call Dr Juel Burrow office and was only able to leave a VM on their triage VM asking them to call me back.   The pt is aware.

## 2016-10-17 ENCOUNTER — Other Ambulatory Visit: Payer: Self-pay | Admitting: *Deleted

## 2016-10-17 ENCOUNTER — Telehealth: Payer: Self-pay | Admitting: Interventional Cardiology

## 2016-10-17 MED ORDER — AMIODARONE HCL 200 MG PO TABS: 200.0000 mg | ORAL_TABLET | Freq: Every day | ORAL | 11 refills | Status: DC

## 2016-10-17 MED ORDER — EPLERENONE 25 MG PO TABS: 25.0000 mg | ORAL_TABLET | Freq: Every day | ORAL | 11 refills | Status: DC

## 2016-10-17 MED ORDER — AMLODIPINE BESYLATE 5 MG PO TABS: 5.0000 mg | ORAL_TABLET | Freq: Every day | ORAL | 11 refills | Status: DC

## 2016-10-17 MED ORDER — LOSARTAN POTASSIUM 50 MG PO TABS: 50.0000 mg | ORAL_TABLET | Freq: Every day | ORAL | 11 refills | Status: DC

## 2016-10-17 MED ORDER — ATORVASTATIN CALCIUM 40 MG PO TABS: 40.0000 mg | ORAL_TABLET | Freq: Every day | ORAL | 11 refills | Status: DC

## 2016-10-17 NOTE — Telephone Encounter (Signed)
New message    Pt wife is calling.   *STAT* If patient is at the pharmacy, call can be transferred to refill team.   1. Which medications need to be refilled? (please list name of each medication and dose if known) Eliquis 5mg , Amiodarone 200mg , Amlodipine 5mg , Eplerenone 25mg , Atorvastatin 40mg , Losartan 50mg   2. Which pharmacy/location (including street and city if local pharmacy) is medication to be sent to? [facility] in [place]   3. Do they need a 30 day or 90 day supply? 90 days

## 2016-10-17 NOTE — Addendum Note (Signed)
Addended by: Domenica Reamer R on: 10/17/2016 02:54 PM   Modules accepted: Orders

## 2016-10-18 MED ORDER — AMIODARONE HCL 200 MG PO TABS: 200.0000 mg | ORAL_TABLET | Freq: Every day | ORAL | 2 refills | Status: DC

## 2016-10-18 MED ORDER — AMLODIPINE BESYLATE 5 MG PO TABS: 5.0000 mg | ORAL_TABLET | Freq: Every day | ORAL | 2 refills | Status: DC

## 2016-10-18 MED ORDER — EPLERENONE 25 MG PO TABS: 25.0000 mg | ORAL_TABLET | Freq: Every day | ORAL | 2 refills | Status: DC

## 2016-10-18 MED ORDER — APIXABAN 5 MG PO TABS: 5.0000 mg | ORAL_TABLET | Freq: Two times a day (BID) | ORAL | 2 refills | Status: DC

## 2016-10-18 MED ORDER — ATORVASTATIN CALCIUM 40 MG PO TABS: 40.0000 mg | ORAL_TABLET | Freq: Every day | ORAL | 2 refills | Status: DC

## 2016-10-18 MED ORDER — LOSARTAN POTASSIUM 50 MG PO TABS: 50.0000 mg | ORAL_TABLET | Freq: Every day | ORAL | 2 refills | Status: DC

## 2017-03-30 ENCOUNTER — Encounter (HOSPITAL_BASED_OUTPATIENT_CLINIC_OR_DEPARTMENT_OTHER): Payer: Self-pay | Admitting: Adult Health

## 2017-03-30 ENCOUNTER — Emergency Department (HOSPITAL_BASED_OUTPATIENT_CLINIC_OR_DEPARTMENT_OTHER)
Admission: EM | Admit: 2017-03-30 | Discharge: 2017-03-30 | Disposition: A | Discharge status: Home / Self Care | Payer: Medicare PPO | Attending: Emergency Medicine | Admitting: Emergency Medicine

## 2017-03-30 ENCOUNTER — Emergency Department (HOSPITAL_BASED_OUTPATIENT_CLINIC_OR_DEPARTMENT_OTHER): Payer: Medicare PPO

## 2017-03-30 DIAGNOSIS — Z7901 Long term (current) use of anticoagulants: Secondary | ICD-10-CM | POA: Diagnosis not present

## 2017-03-30 DIAGNOSIS — M25571 Pain in right ankle and joints of right foot: Secondary | ICD-10-CM | POA: Insufficient documentation

## 2017-03-30 DIAGNOSIS — R2241 Localized swelling, mass and lump, right lower limb: Secondary | ICD-10-CM | POA: Diagnosis not present

## 2017-03-30 DIAGNOSIS — I5032 Chronic diastolic (congestive) heart failure: Secondary | ICD-10-CM | POA: Diagnosis not present

## 2017-03-30 DIAGNOSIS — Z8673 Personal history of transient ischemic attack (TIA), and cerebral infarction without residual deficits: Secondary | ICD-10-CM | POA: Insufficient documentation

## 2017-03-30 DIAGNOSIS — I110 Hypertensive heart disease with heart failure: Secondary | ICD-10-CM | POA: Diagnosis not present

## 2017-03-30 DIAGNOSIS — Z79899 Other long term (current) drug therapy: Secondary | ICD-10-CM | POA: Diagnosis not present

## 2017-03-30 MED ORDER — INDOMETHACIN 25 MG PO CAPS: 25.0000 mg | ORAL_CAPSULE | Freq: Three times a day (TID) | ORAL | 0 refills | Status: DC | PRN

## 2017-03-30 MED ORDER — TRAMADOL HCL 50 MG PO TABS
50.0000 mg | ORAL_TABLET | Freq: Four times a day (QID) | ORAL | 0 refills | Status: DC | PRN
Start: 2017-03-30 — End: 2018-04-15

## 2017-03-30 NOTE — ED Triage Notes (Signed)
Presents with right ankle swelling, warmth and redness that began Friday and is associated with pain with movement and pressure. He denies injury. Has tried capscien cream and acetaminophen with no relief. Denies history of gout. Denies fevers.

## 2017-03-30 NOTE — ED Provider Notes (Signed)
[facility] DEPT [facility] Provider Note   CSN: [id number] Arrival date & time: 03/30/17  1053     History   Chief Complaint Chief Complaint  Patient presents with  . Ankle Pain    HPI Harinder Romas is a 83 y.o. male.  Patient is a 83 year old male presenting with complaints of right ankle pain. This began several days ago in the absence of any injury or trauma. It is worse when he ambulates and relieved with rest. He denies any fevers or chills.   The history is provided by the patient.  Ankle Pain   The incident occurred 2 days ago. There was no injury mechanism. The pain is moderate. The pain has been constant since onset. Pertinent negatives include no numbness. He has tried nothing for the symptoms. The treatment provided no relief.    Past Medical History:  Diagnosis Date  . Atrial fibrillation ([place])   . Hypertension   . Pneumonia   . TIA (transient ischemic attack)   . Whooping cough     Patient Active Problem List   Diagnosis Date Noted  . Bradycardia 02/08/2014  . TIA (transient ischemic attack) 06/04/2013  . Chronic diastolic heart failure ([place]) 06/04/2013  . Vision changes 06/03/2013  . Headache 06/03/2013  . Atrial fibrillation ([place]) 06/03/2013  . HTN (hypertension) 06/03/2013  . HLD (hyperlipidemia) 06/03/2013    Past Surgical History:  Procedure Laterality Date  . EYE SURGERY    . TONSILLECTOMY         Home Medications    Prior to Admission medications   Medication Sig Start Date End Date Taking? Authorizing Provider  amiodarone (PACERONE) 200 MG tablet Take 1 tablet (200 mg total) by mouth daily. 10/18/16  Yes Jettie Booze, MD  amLODipine (NORVASC) 5 MG tablet Take 1 tablet (5 mg total) by mouth daily. 10/18/16   Jettie Booze, MD  apixaban (ELIQUIS) 5 MG TABS tablet Take 1 tablet (5 mg total) by mouth 2 (two) times daily. 10/18/16   Jettie Booze, MD  Ascorbic Acid (VITAMIN C) 1000 MG tablet Take 1,000 mg by mouth 2  (two) times daily.    [provider]  atorvastatin (LIPITOR) 40 MG tablet Take 1 tablet (40 mg total) by mouth daily. 10/18/16   Jettie Booze, MD  Calcium Carbonate-Vit D-Min (CALCIUM 1200 PO) Take 1,200 mg by mouth 2 (two) times daily.    [provider]  Cholecalciferol (VITAMIN D3) 10000 UNITS capsule Take 10,000 Units by mouth 2 (two) times daily.    [provider]  Cyanocobalamin (VITAMIN B-12 PO) Take 5,000 mcg by mouth daily.    [provider]  diphenoxylate-atropine (LOMOTIL) 2.5-0.025 MG per tablet Take 1 tablet by mouth 4 (four) times daily as needed for diarrhea or loose stools. 05/08/15   Tanna Furry, MD  eplerenone (INSPRA) 25 MG tablet Take 1 tablet (25 mg total) by mouth daily. 10/18/16   Jettie Booze, MD  Ginger, Zingiber officinalis, (GINGER ROOT) 550 MG CAPS Take 550 mg by mouth daily.    [provider]  levothyroxine (SYNTHROID, LEVOTHROID) 25 MCG tablet Take 1 tablet (25 mcg total) by mouth daily before breakfast. 08/16/16   Burtis Junes, NP  losartan (COZAAR) 50 MG tablet Take 1 tablet (50 mg total) by mouth daily. 10/18/16   Jettie Booze, MD  Magnesium 500 MG CAPS Take 500 mg by mouth 2 (two) times daily.    [provider]  Melatonin 10 MG  TABS Take 10 mg by mouth at bedtime.    [provider]  metroNIDAZOLE (FLAGYL) 500 MG tablet Take 1 tablet (500 mg total) by mouth 2 (two) times daily. 05/08/15   Tanna Furry, MD  Misc Natural Products (GLUCOSAMINE CHONDROITIN TRIPLE PO) Take 1 tablet by mouth 2 (two) times daily.    [provider]  Zinc 50 MG TABS Take 50 mg by mouth daily.    [provider]    Family History Family History  Problem Relation Age of Onset  . CAD Father   . Colon cancer Other     Social History Social History  Substance Use Topics  . Smoking status: Never Smoker  . Smokeless tobacco: Not on file  . Alcohol use 1.2 oz/week    2 Glasses of  wine per week     Allergies   Corticosteroids; Methylprednisolone; Avocado; Penicillins; and Cortisone   Review of Systems Review of Systems  Neurological: Negative for numbness.  All other systems reviewed and are negative.    Physical Exam Updated Vital Signs BP 134/75 (BP Location: Left Arm)   Pulse 62   Temp 98.6 F (37 C) (Oral)   Resp 20   Ht 5\' 6"  (1.676 m)   Wt 72.1 kg (159 lb)   SpO2 97%   BMI 25.66 kg/m   Physical Exam  Constitutional: He is oriented to person, place, and time. He appears well-developed and well-nourished. No distress.  HENT:  Head: Normocephalic and atraumatic.  Neck: Normal range of motion. Neck supple.  Pulmonary/Chest: Effort normal.  Musculoskeletal:  The right ankle has mild swelling and warmth. There is no redness or erythema. He has pain with range of motion. PMS is intact to the toes.  Neurological: He is alert and oriented to person, place, and time.  Skin: Skin is warm and dry. He is not diaphoretic.  Nursing note and vitals reviewed.    ED Treatments / Results  Labs (all labs ordered are listed, but only abnormal results are displayed) Labs Reviewed - No data to display  EKG  EKG Interpretation None       Radiology Dg Ankle Complete Right  Result Date: 03/30/2017 CLINICAL DATA:  Right ankle pain, swelling, and erythema for 2 days. No known injury. EXAM: RIGHT ANKLE - COMPLETE 3+ VIEW COMPARISON:  None. FINDINGS: There is no evidence of fracture, dislocation, or joint effusion. No evidence of osteolysis or periostitis. Degenerative spurring seen involving the medial malleolus. No evidence of joint space narrowing. Peripheral vascular calcification noted. Plantar and dorsal calcaneal bone spurs also noted. IMPRESSION: No acute findings. Electronically Signed   By: Earle Gell M.D.   On: 03/30/2017 11:49    Procedures Procedures (including critical care time)  Medications Ordered in ED Medications - No data to  display   Initial Impression / Assessment and Plan / ED Course  I have reviewed the triage vital signs and the nursing notes.  Pertinent labs & imaging results that were available during my care of the patient were reviewed by me and considered in my medical decision making (see chart for details).  Patient with a swollen painful right ankle. I suspect gout. I have considered a septic joint, however there is no fever and I doubt this to be the case. He will be treated with indomethacin, pain medicine, and follow-up with his primary Dr. if not improving.  Final Clinical Impressions(s) / ED Diagnoses   Final diagnoses:  None    New Prescriptions  New Prescriptions   No medications on file     Veryl Speak, MD 03/30/17 1325

## 2017-03-30 NOTE — Discharge Instructions (Signed)
Indocin as prescribed.  Tramadol as prescribed as needed for pain.  Follow up with your primary Dr. if not improving in the next 3-4 days, and return to the ER if symptoms worsen or change.

## 2017-05-15 ENCOUNTER — Encounter: Payer: Self-pay | Admitting: Family Medicine

## 2017-05-30 ENCOUNTER — Other Ambulatory Visit: Payer: Self-pay | Admitting: Interventional Cardiology

## 2018-03-19 ENCOUNTER — Telehealth: Payer: Self-pay | Admitting: Nurse Practitioner

## 2018-03-19 NOTE — Telephone Encounter (Signed)
Spoke to patient's wife in regards to the patient's cold feet at night time and concern over poor circulation.  I informed her that she should be getting a call to schedule an appt.  She verbalized understanding.

## 2018-03-19 NOTE — Telephone Encounter (Signed)
New Message   Pt's wife is calling, states the pt cant sleep at night due to feet being very cold and says she thinks a circulation issue please call

## 2018-03-27 ENCOUNTER — Telehealth: Payer: Self-pay | Admitting: Interventional Cardiology

## 2018-03-27 NOTE — Telephone Encounter (Signed)
NEW MESSAGE    Patient's spouse calling to request asap appt for cold feet and trouble sleeping.  No PCP Declined next available with APP

## 2018-03-27 NOTE — Telephone Encounter (Addendum)
Returned call to patient and his wife. The patient and his wife live in [place] for 6 months of the year and they are back in [place]. Wife states that the patient has been having trouble with his feet being cold at night and not being able to sleep. The patient does not have a PCP. Patient denies pain in his legs or pain with walking. Denies color changes. Patient denies non-healing wounds or sores on his legs or feet. Denies any CP, SOB, or any other Sx. Patient's cardiologist in [place] has been keeping [place] updated and has faxed over his records that have been scanned to the chart. LE Arterial US from 09/2017 was normal. Patient is due for f/u with JV. Appointment scheduled for 8/21 at 4:00 PM.

## 2018-04-15 ENCOUNTER — Encounter: Payer: Self-pay | Admitting: Interventional Cardiology

## 2018-04-15 ENCOUNTER — Ambulatory Visit: Payer: Medicare PPO | Admitting: Interventional Cardiology

## 2018-04-15 VITALS — BP 140/64 | HR 59 | Ht 66.0 in | Wt 167.4 lb

## 2018-04-15 DIAGNOSIS — Z7901 Long term (current) use of anticoagulants: Secondary | ICD-10-CM

## 2018-04-15 DIAGNOSIS — I480 Paroxysmal atrial fibrillation: Secondary | ICD-10-CM | POA: Diagnosis not present

## 2018-04-15 DIAGNOSIS — I10 Essential (primary) hypertension: Secondary | ICD-10-CM

## 2018-04-15 DIAGNOSIS — E7800 Pure hypercholesterolemia, unspecified: Secondary | ICD-10-CM

## 2018-04-15 NOTE — Patient Instructions (Signed)
Medication Instructions:  Your physician recommends that you continue on your current medications as directed. Please refer to the Current Medication list given to you today.   Labwork: None ordered  Testing/Procedures: None ordered  Follow-Up: Your physician wants you to follow-up in: 9-12 months with Dr. Irish Lack. You will receive a reminder letter in the mail two months in advance. If you don't receive a letter, please call our office to schedule the follow-up appointment.   Any Other Special Instructions Will Be Listed Below (If Applicable).     If you need a refill on your cardiac medications before your next appointment, please call your pharmacy.

## 2018-04-15 NOTE — Progress Notes (Signed)
Cardiology Office Note   Date:  04/15/2018   ID:  Stephen Hendrix, DOB 01/30/1929, MRN [id number]  PCP:  Martinique, Betty G, MD    No chief complaint on file.  PAF  Wt Readings from Last 3 Encounters:  04/15/18 167 lb 6.4 oz (75.9 kg)  03/30/17 159 lb (72.1 kg)  05/08/15 164 lb (74.4 kg)       History of Present Illness: Stephen Hendrix is a 84 y.o. male  has a history of PAF controlled on Amiodarone, carotid stenosis, HTN, HL, and prior TIA. He was started on Eliquis in 05/2013 for stroke prophylaxis. CHADS2-VASc=5. He spends 6 months in the [place] and 6 months in [place]. He also sees Cardiology in [place].  He had some hematuria in 2017.   He reports some feeling of cold feet at night.  He wraps them up and then takes some sleeping pills, and then the sx resolve. He had some tests.  Normal ABIs in 2/19 in [place].   Echo in 4/19 was normal.   Denies : Chest pain. Leg edema. Nitroglycerin use. Orthopnea. Palpitations. Paroxysmal nocturnal dyspnea. Shortness of breath. Syncope.   Mild orthostatic sx.   Has noticed low HR, in the 40s when he first wakes up.  No lightheadedness or syncope.   Past Medical History:  Diagnosis Date  . Atrial fibrillation ([place])   . Hypertension   . Pneumonia   . TIA (transient ischemic attack)   . Whooping cough     Past Surgical History:  Procedure Laterality Date  . EYE SURGERY    . TONSILLECTOMY       Current Outpatient Medications  Medication Sig Dispense Refill  . amiodarone (PACERONE) 200 MG tablet Take 1 tablet (200 mg total) by mouth daily. 90 tablet 2  . amLODipine (NORVASC) 5 MG tablet Please call to schedule yearly appointment for further refills [phone]. 30 tablet 0  . apixaban (ELIQUIS) 5 MG TABS tablet Take 1 tablet (5 mg total) by mouth 2 (two) times daily. 180 tablet 2  . Ascorbic Acid (VITAMIN C) 1000 MG tablet Take 1,000 mg by mouth 2 (two) times daily.    Marland Kitchen atorvastatin (LIPITOR) 40 MG tablet Take 1  tablet (40 mg total) by mouth daily. 90 tablet 2  . Calcium Carbonate-Vit D-Min (CALCIUM 1200 PO) Take 1,200 mg by mouth 2 (two) times daily.    . Cholecalciferol (VITAMIN D3) 10000 UNITS capsule Take 10,000 Units by mouth 2 (two) times daily.    . Cyanocobalamin (VITAMIN B-12 PO) Take 5,000 mcg by mouth daily.    Marland Kitchen eplerenone (INSPRA) 25 MG tablet Please call to schedule early appointment for further refills [phone] 30 tablet 0  . levothyroxine (SYNTHROID, LEVOTHROID) 25 MCG tablet Take 1 tablet (25 mcg total) by mouth daily before breakfast. 90 tablet 3  . losartan (COZAAR) 50 MG tablet Take 1 tablet (50 mg total) by mouth daily. 90 tablet 2  . Misc Natural Products (GLUCOSAMINE CHONDROITIN TRIPLE PO) Take 1 tablet by mouth 2 (two) times daily.     No current facility-administered medications for this visit.     Allergies:   Corticosteroids; Methylprednisolone; Other; Penicillins; Avocado; and Cortisone    Social History:  The patient  reports that he has never smoked. He has never used smokeless tobacco. He reports that he drinks about 2.0 standard drinks of alcohol per week. He reports that he does not use drugs.   Family History:  The patient's family history  includes CAD in his father; Colon cancer in his other.    ROS:  Please see the history of present illness.   Otherwise, review of systems are positive for cold feet at night.   All other systems are reviewed and negative.    PHYSICAL EXAM: VS:  BP 140/64   Pulse (!) 59   Ht 5\' 6"  (8.850 m)   Wt 167 lb 6.4 oz (75.9 kg)   SpO2 98%   BMI 27.02 kg/m  , BMI Body mass index is 27.02 kg/m. GEN: Well nourished, well developed, in no acute distress  HEENT: normal  Neck: no JVD, carotid bruits, or masses Cardiac: RRR; no murmurs, rubs, or gallops,no edema  Respiratory:  clear to auscultation bilaterally, normal work of breathing GI: soft, nontender, nondistended, + BS MS: no deformity or atrophy ; palpable DP pulses  bilaterally Skin: warm and dry, no rash Neuro:  Strength and sensation are intact Psych: euthymic mood, full affect   EKG:   The ekg ordered today demonstrates NSR, RBBB   Recent Labs: No results found for requested labs within last 8760 hours.   Lipid Panel    Component Value Date/Time   CHOL 150 06/04/2013 0615   TRIG 65 06/04/2013 0615   HDL 55 06/04/2013 0615   CHOLHDL 2.7 06/04/2013 0615   VLDL 13 06/04/2013 0615   LDLCALC 82 06/04/2013 0615     Other studies Reviewed: Additional studies/ records that were reviewed today with results demonstrating: labs reviewed.   ASSESSMENT AND PLAN:  1. Paroxysmal AFib: Amio to maintain NSR.  Eliquis for stroke prevention.    2. Cold feet: Does not appear to be a vascular problem.  He tried gabapentin but this made him very drowsy.  He manages this with wrapping his feet.  He is already taking B12 supplement. 3. Anticoagulated: No bleeding issues. 4. Hypothyroid: I reviewed his labs from [place].  His TSH was elevated but he was off of his Synthroid at that time.  He has since restarted the medicine.  He will have this rechecked when he goes back to [place] in October. 5. Hyperlipidemia: LDL 63 in 2019. Continue atorvastatin.    Current medicines are reviewed at length with the patient today.  The patient concerns regarding his medicines were addressed.  The following changes have been made:  No change  Labs/ tests ordered today include:  No orders of the defined types were placed in this encounter.   Recommend 150 minutes/week of aerobic exercise Low fat, low carb, high fiber diet recommended  Disposition:   FU in 1 year   Signed, Larae Grooms, MD  04/15/2018 4:13 PM    [facility] [place], [place], [place]  [address] Phone: [phone]; Fax: [phone]

## 2018-09-18 ENCOUNTER — Inpatient Hospital Stay (HOSPITAL_BASED_OUTPATIENT_CLINIC_OR_DEPARTMENT_OTHER)
Admission: EM | Admit: 2018-09-18 | Discharge: 2018-09-21 | DRG: 872 | Disposition: A | Discharge status: Home Health | Payer: Medicare PPO | Attending: Family Medicine | Admitting: Family Medicine

## 2018-09-18 ENCOUNTER — Encounter (HOSPITAL_BASED_OUTPATIENT_CLINIC_OR_DEPARTMENT_OTHER): Payer: Self-pay | Admitting: *Deleted

## 2018-09-18 ENCOUNTER — Emergency Department (HOSPITAL_BASED_OUTPATIENT_CLINIC_OR_DEPARTMENT_OTHER): Payer: Medicare PPO

## 2018-09-18 ENCOUNTER — Other Ambulatory Visit: Payer: Self-pay

## 2018-09-18 DIAGNOSIS — A419 Sepsis, unspecified organism: Secondary | ICD-10-CM | POA: Diagnosis present

## 2018-09-18 DIAGNOSIS — E039 Hypothyroidism, unspecified: Secondary | ICD-10-CM | POA: Diagnosis present

## 2018-09-18 DIAGNOSIS — Z8673 Personal history of transient ischemic attack (TIA), and cerebral infarction without residual deficits: Secondary | ICD-10-CM | POA: Diagnosis not present

## 2018-09-18 DIAGNOSIS — E785 Hyperlipidemia, unspecified: Secondary | ICD-10-CM | POA: Diagnosis present

## 2018-09-18 DIAGNOSIS — Z79899 Other long term (current) drug therapy: Secondary | ICD-10-CM

## 2018-09-18 DIAGNOSIS — Z7901 Long term (current) use of anticoagulants: Secondary | ICD-10-CM

## 2018-09-18 DIAGNOSIS — N179 Acute kidney failure, unspecified: Secondary | ICD-10-CM | POA: Diagnosis present

## 2018-09-18 DIAGNOSIS — I480 Paroxysmal atrial fibrillation: Secondary | ICD-10-CM | POA: Diagnosis present

## 2018-09-18 DIAGNOSIS — K5900 Constipation, unspecified: Secondary | ICD-10-CM | POA: Diagnosis present

## 2018-09-18 DIAGNOSIS — I10 Essential (primary) hypertension: Secondary | ICD-10-CM | POA: Diagnosis present

## 2018-09-18 DIAGNOSIS — Z1612 Extended spectrum beta lactamase (ESBL) resistance: Secondary | ICD-10-CM | POA: Diagnosis present

## 2018-09-18 DIAGNOSIS — R9431 Abnormal electrocardiogram [ECG] [EKG]: Secondary | ICD-10-CM | POA: Diagnosis not present

## 2018-09-18 DIAGNOSIS — N182 Chronic kidney disease, stage 2 (mild): Secondary | ICD-10-CM

## 2018-09-18 DIAGNOSIS — R001 Bradycardia, unspecified: Secondary | ICD-10-CM | POA: Diagnosis present

## 2018-09-18 DIAGNOSIS — Z881 Allergy status to other antibiotic agents status: Secondary | ICD-10-CM | POA: Diagnosis not present

## 2018-09-18 DIAGNOSIS — Z8551 Personal history of malignant neoplasm of bladder: Secondary | ICD-10-CM

## 2018-09-18 DIAGNOSIS — Z888 Allergy status to other drugs, medicaments and biological substances status: Secondary | ICD-10-CM

## 2018-09-18 DIAGNOSIS — Z800 Family history of malignant neoplasm of digestive organs: Secondary | ICD-10-CM

## 2018-09-18 DIAGNOSIS — R21 Rash and other nonspecific skin eruption: Secondary | ICD-10-CM | POA: Diagnosis not present

## 2018-09-18 DIAGNOSIS — Z91018 Allergy to other foods: Secondary | ICD-10-CM | POA: Diagnosis not present

## 2018-09-18 DIAGNOSIS — I4891 Unspecified atrial fibrillation: Secondary | ICD-10-CM | POA: Diagnosis present

## 2018-09-18 DIAGNOSIS — R0902 Hypoxemia: Secondary | ICD-10-CM | POA: Diagnosis present

## 2018-09-18 DIAGNOSIS — N390 Urinary tract infection, site not specified: Secondary | ICD-10-CM | POA: Diagnosis present

## 2018-09-18 DIAGNOSIS — Z880 Allergy status to penicillin: Secondary | ICD-10-CM | POA: Diagnosis not present

## 2018-09-18 DIAGNOSIS — Z79890 Hormone replacement therapy (postmenopausal): Secondary | ICD-10-CM

## 2018-09-18 DIAGNOSIS — T378X5A Adverse effect of other specified systemic anti-infectives and antiparasitics, initial encounter: Secondary | ICD-10-CM | POA: Diagnosis present

## 2018-09-18 DIAGNOSIS — I130 Hypertensive heart and chronic kidney disease with heart failure and stage 1 through stage 4 chronic kidney disease, or unspecified chronic kidney disease: Secondary | ICD-10-CM | POA: Diagnosis present

## 2018-09-18 DIAGNOSIS — L270 Generalized skin eruption due to drugs and medicaments taken internally: Secondary | ICD-10-CM | POA: Diagnosis present

## 2018-09-18 DIAGNOSIS — G459 Transient cerebral ischemic attack, unspecified: Secondary | ICD-10-CM

## 2018-09-18 DIAGNOSIS — Z8249 Family history of ischemic heart disease and other diseases of the circulatory system: Secondary | ICD-10-CM | POA: Diagnosis not present

## 2018-09-18 DIAGNOSIS — I5032 Chronic diastolic (congestive) heart failure: Secondary | ICD-10-CM | POA: Diagnosis present

## 2018-09-18 DIAGNOSIS — B9620 Unspecified Escherichia coli [E. coli] as the cause of diseases classified elsewhere: Secondary | ICD-10-CM | POA: Diagnosis present

## 2018-09-18 HISTORY — DX: Malignant (primary) neoplasm, unspecified: C80.1

## 2018-09-18 LAB — COMPREHENSIVE METABOLIC PANEL
ALT: 17 U/L (ref 0–44)
AST: 20 U/L (ref 15–41)
Albumin: 3.5 g/dL (ref 3.5–5.0)
Alkaline Phosphatase: 59 U/L (ref 38–126)
Anion gap: 10 (ref 5–15)
BUN: 31 mg/dL — ABNORMAL HIGH (ref 8–23)
CHLORIDE: 107 mmol/L (ref 98–111)
CO2: 22 mmol/L (ref 22–32)
Calcium: 9 mg/dL (ref 8.9–10.3)
Creatinine, Ser: 1.58 mg/dL — ABNORMAL HIGH (ref 0.61–1.24)
GFR calc Af Amer: 45 mL/min — ABNORMAL LOW (ref >60)
GFR calc non Af Amer: 39 mL/min — ABNORMAL LOW (ref >60)
Glucose, Bld: 161 mg/dL — ABNORMAL HIGH (ref 70–99)
POTASSIUM: 4 mmol/L (ref 3.5–5.1)
Sodium: 139 mmol/L (ref 135–145)
Total Bilirubin: 1.3 mg/dL — ABNORMAL HIGH (ref 0.3–1.2)
Total Protein: 6.7 g/dL (ref 6.5–8.1)

## 2018-09-18 LAB — URINALYSIS, MICROSCOPIC (REFLEX)

## 2018-09-18 LAB — CBC WITH DIFFERENTIAL/PLATELET
Abs Immature Granulocytes: 0.25 10*3/uL — ABNORMAL HIGH (ref 0.00–0.07)
Basophils Absolute: 0 10*3/uL (ref 0.0–0.1)
Basophils Relative: 0 %
EOS ABS: 0.4 10*3/uL (ref 0.0–0.5)
Eosinophils Relative: 2 %
HCT: 38.9 % — ABNORMAL LOW (ref 39.0–52.0)
Hemoglobin: 12 g/dL — ABNORMAL LOW (ref 13.0–17.0)
Immature Granulocytes: 1 %
Lymphocytes Relative: 2 %
Lymphs Abs: 0.4 10*3/uL — ABNORMAL LOW (ref 0.7–4.0)
MCH: 27.7 pg (ref 26.0–34.0)
MCHC: 30.8 g/dL (ref 30.0–36.0)
MCV: 89.8 fL (ref 80.0–100.0)
Monocytes Absolute: 0.9 10*3/uL (ref 0.1–1.0)
Monocytes Relative: 4 %
Neutro Abs: 19.1 10*3/uL — ABNORMAL HIGH (ref 1.7–7.7)
Neutrophils Relative %: 91 %
PLATELETS: 294 10*3/uL (ref 150–400)
RBC: 4.33 MIL/uL (ref 4.22–5.81)
RDW: 15.5 % (ref 11.5–15.5)
WBC: 21 10*3/uL — AB (ref 4.0–10.5)
nRBC: 0 % (ref 0.0–0.2)

## 2018-09-18 LAB — URINALYSIS, ROUTINE W REFLEX MICROSCOPIC
Bilirubin Urine: NEGATIVE
Glucose, UA: NEGATIVE mg/dL
Ketones, ur: NEGATIVE mg/dL
LEUKOCYTES UA: NEGATIVE
NITRITE: NEGATIVE
Protein, ur: 30 mg/dL — AB
Specific Gravity, Urine: <1.005 — ABNORMAL LOW (ref 1.005–1.030)
pH: 5.5 (ref 5.0–8.0)

## 2018-09-18 LAB — BRAIN NATRIURETIC PEPTIDE: B Natriuretic Peptide: 254.7 pg/mL — ABNORMAL HIGH (ref 0.0–100.0)

## 2018-09-18 LAB — I-STAT CG4 LACTIC ACID, ED: Lactic Acid, Venous: 1.67 mmol/L (ref 0.5–1.9)

## 2018-09-18 LAB — MAGNESIUM: Magnesium: 2 mg/dL (ref 1.7–2.4)

## 2018-09-18 LAB — LACTIC ACID, PLASMA: Lactic Acid, Venous: 1.6 mmol/L (ref 0.5–1.9)

## 2018-09-18 LAB — MRSA PCR SCREENING: MRSA by PCR: NEGATIVE

## 2018-09-18 LAB — PROTIME-INR
INR: 1.79
Prothrombin Time: 20.6 seconds — ABNORMAL HIGH (ref 11.4–15.2)

## 2018-09-18 LAB — PROCALCITONIN: PROCALCITONIN: 0.3 ng/mL

## 2018-09-18 MED ORDER — HYDROXYZINE HCL 10 MG PO TABS: 10.0000 mg | ORAL_TABLET | Freq: Three times a day (TID) | ORAL | Status: DC | PRN

## 2018-09-18 MED ORDER — ATORVASTATIN CALCIUM 40 MG PO TABS
40.0000 mg | ORAL_TABLET | Freq: Every day | ORAL | Status: DC
  Administered 2018-09-18 – 2018-09-21 (×4): 40 mg via ORAL
  Filled 2018-09-18 (×4): qty 1

## 2018-09-18 MED ORDER — PIPERACILLIN-TAZOBACTAM 3.375 G IVPB 30 MIN
3.3750 g | Freq: Once | INTRAVENOUS | Status: AC
  Administered 2018-09-18: 3.375 g via INTRAVENOUS
  Filled 2018-09-18 (×2): qty 50

## 2018-09-18 MED ORDER — SODIUM CHLORIDE 0.9% FLUSH
3.0000 mL | Freq: Once | INTRAVENOUS | Status: AC
  Administered 2018-09-18: 3 mL via INTRAVENOUS
  Filled 2018-09-18: qty 3

## 2018-09-18 MED ORDER — SODIUM CHLORIDE 0.9 % IV SOLN
INTRAVENOUS | Status: DC | PRN
  Administered 2018-09-18: 250 mL via INTRAVENOUS

## 2018-09-18 MED ORDER — SENNA 8.6 MG PO TABS
1.0000 | ORAL_TABLET | Freq: Every day | ORAL | Status: DC
  Administered 2018-09-19 – 2018-09-21 (×3): 8.6 mg via ORAL
  Filled 2018-09-18 (×4): qty 1

## 2018-09-18 MED ORDER — HYDRALAZINE HCL 20 MG/ML IJ SOLN: 5.0000 mg | INTRAMUSCULAR | Status: DC | PRN

## 2018-09-18 MED ORDER — SODIUM CHLORIDE 0.9 % IV SOLN
1.0000 g | Freq: Two times a day (BID) | INTRAVENOUS | Status: DC
  Administered 2018-09-18 – 2018-09-20 (×5): 1 g via INTRAVENOUS
  Filled 2018-09-18 (×6): qty 1

## 2018-09-18 MED ORDER — ACETAMINOPHEN 650 MG RE SUPP: 650.0000 mg | Freq: Four times a day (QID) | RECTAL | Status: DC | PRN

## 2018-09-18 MED ORDER — POLYETHYLENE GLYCOL 3350 17 G PO PACK
17.0000 g | PACK | Freq: Every day | ORAL | Status: DC | PRN
  Filled 2018-09-18: qty 1

## 2018-09-18 MED ORDER — LEVOTHYROXINE SODIUM 25 MCG PO TABS
25.0000 ug | ORAL_TABLET | Freq: Every day | ORAL | Status: DC
  Administered 2018-09-19 – 2018-09-21 (×3): 25 ug via ORAL
  Filled 2018-09-18 (×4): qty 1

## 2018-09-18 MED ORDER — VITAMIN C 250 MG PO TABS
250.0000 mg | ORAL_TABLET | Freq: Every day | ORAL | Status: DC
  Administered 2018-09-19 – 2018-09-21 (×3): 250 mg via ORAL
  Filled 2018-09-18 (×3): qty 1

## 2018-09-18 MED ORDER — CALCIUM CITRATE-VITAMIN D 500-500 MG-UNIT PO CHEW
1.0000 | CHEWABLE_TABLET | Freq: Every day | ORAL | Status: DC
  Filled 2018-09-18: qty 1

## 2018-09-18 MED ORDER — VITAMIN D 25 MCG (1000 UNIT) PO TABS
1000.0000 [IU] | ORAL_TABLET | Freq: Every day | ORAL | Status: DC
  Administered 2018-09-19 – 2018-09-21 (×3): 1000 [IU] via ORAL
  Filled 2018-09-18 (×3): qty 1

## 2018-09-18 MED ORDER — SODIUM CHLORIDE 0.9 % IV BOLUS
1000.0000 mL | Freq: Once | INTRAVENOUS | Status: AC
  Administered 2018-09-18: 1000 mL via INTRAVENOUS

## 2018-09-18 MED ORDER — IOPAMIDOL (ISOVUE-300) INJECTION 61%
100.0000 mL | Freq: Once | INTRAVENOUS | Status: AC | PRN
  Administered 2018-09-18: 80 mL via INTRAVENOUS

## 2018-09-18 MED ORDER — SODIUM CHLORIDE 0.9 % IV BOLUS
500.0000 mL | Freq: Once | INTRAVENOUS | Status: AC
  Administered 2018-09-18: 500 mL via INTRAVENOUS

## 2018-09-18 MED ORDER — APIXABAN 5 MG PO TABS
5.0000 mg | ORAL_TABLET | Freq: Two times a day (BID) | ORAL | Status: DC
  Administered 2018-09-18 – 2018-09-21 (×6): 5 mg via ORAL
  Filled 2018-09-18 (×7): qty 1

## 2018-09-18 MED ORDER — ZOLPIDEM TARTRATE 5 MG PO TABS: 5.0000 mg | ORAL_TABLET | Freq: Every evening | ORAL | Status: DC | PRN

## 2018-09-18 MED ORDER — AMIODARONE HCL 200 MG PO TABS
200.0000 mg | ORAL_TABLET | Freq: Every day | ORAL | Status: DC
  Administered 2018-09-18 – 2018-09-21 (×4): 200 mg via ORAL
  Filled 2018-09-18 (×4): qty 1

## 2018-09-18 MED ORDER — ORAL CARE MOUTH RINSE
15.0000 mL | Freq: Two times a day (BID) | OROMUCOSAL | Status: DC
  Administered 2018-09-18 – 2018-09-21 (×6): 15 mL via OROMUCOSAL

## 2018-09-18 MED ORDER — LIDOCAINE HCL URETHRAL/MUCOSAL 2 % EX GEL
1.0000 "application " | Freq: Once | CUTANEOUS | Status: DC
  Filled 2018-09-18: qty 20

## 2018-09-18 MED ORDER — ACETAMINOPHEN 325 MG PO TABS
650.0000 mg | ORAL_TABLET | Freq: Four times a day (QID) | ORAL | Status: DC | PRN
  Administered 2018-09-19 – 2018-09-20 (×3): 650 mg via ORAL
  Filled 2018-09-18 (×3): qty 2

## 2018-09-18 MED ORDER — CENTRUM PO CHEW
1.0000 | CHEWABLE_TABLET | Freq: Every day | ORAL | Status: DC
  Administered 2018-09-19: 1 via ORAL
  Filled 2018-09-18 (×3): qty 1

## 2018-09-18 MED ORDER — SODIUM CHLORIDE 0.9 % IV BOLUS (SEPSIS)
1250.0000 mL | Freq: Once | INTRAVENOUS | Status: AC
  Administered 2018-09-18: 1250 mL via INTRAVENOUS

## 2018-09-18 MED ORDER — LORATADINE 10 MG PO TABS
10.0000 mg | ORAL_TABLET | Freq: Every day | ORAL | Status: DC
  Administered 2018-09-18 – 2018-09-21 (×4): 10 mg via ORAL
  Filled 2018-09-18 (×4): qty 1

## 2018-09-18 MED ORDER — RISAQUAD PO CAPS
1.0000 | ORAL_CAPSULE | Freq: Every day | ORAL | Status: DC
  Administered 2018-09-18 – 2018-09-21 (×4): 1 via ORAL
  Filled 2018-09-18 (×6): qty 1

## 2018-09-18 MED ORDER — ONDANSETRON HCL 4 MG/2ML IJ SOLN
4.0000 mg | Freq: Once | INTRAMUSCULAR | Status: AC
  Administered 2018-09-18: 4 mg via INTRAVENOUS
  Filled 2018-09-18: qty 2

## 2018-09-18 MED ORDER — DIPHENHYDRAMINE HCL 50 MG/ML IJ SOLN
12.5000 mg | Freq: Once | INTRAMUSCULAR | Status: AC
  Administered 2018-09-18: 12.5 mg via INTRAVENOUS
  Filled 2018-09-18: qty 1

## 2018-09-18 MED ORDER — VITAMIN B-12 1000 MCG PO TABS
1000.0000 ug | ORAL_TABLET | Freq: Every day | ORAL | Status: DC
  Administered 2018-09-19 – 2018-09-21 (×3): 1000 ug via ORAL
  Filled 2018-09-18 (×3): qty 1

## 2018-09-18 NOTE — ED Triage Notes (Signed)
Fever, chills, nausea. He is being treated for a UTI. He is not getting better.

## 2018-09-18 NOTE — ED Notes (Signed)
Attempted to obtain urine specimen, but pt unable to provide one at this time.

## 2018-09-18 NOTE — ED Notes (Signed)
Attempted I&O cath without success. Resistance with passing cath into bladder. EDP notified see new orders.

## 2018-09-18 NOTE — H&P (Addendum)
History and Physical    Stephen Hendrix WUX:[id number] DOB: 1929/05/05 DOA: 09/18/2018  Referring MD/NP/PA:   PCP: Martinique, Betty G, MD   Patient coming from:  The patient is coming from home.  At baseline, pt is independent for most of ADL.        Chief Complaint: fever and chills  HPI: Stephen Hendrix is a 85 y.o. male with medical history significant of atrial fibrillation on eliquis, HTN, TIA, HFpEF, CKD-II, HLD, hypothyroidism, recurrent low-grade noninvasive bladder cancer that was complicated by right ureteral obstruction (status post transurethral resection of the right UO and stent placement), who presents with fever and chill  Pt states that he has not been feeling well for several weeks. He states that he has felt generally poor & somewhat weak. He was in [place] when sxs ensued with dysuria and malodorous urine. He had UA & culture which was positive ESBL E. Coli. He did not receive abx there. He came back to [place]. He was seen by his urologist Dr. Nevada Crane w/ [name] 09/11/18. He had UA w/ culture. Given 160 mg of IM tobramycin and prescription for macrobid 100 mg BID. Taking as prescribed with improvement of urinary sxs, but no change in general malaise, nausea, and now w/ dry heaving.  Patient states that he developed rash in both lower legs 3 days after taking Macrobid. He developed fever and chills and had temperature 101 at home last night.  Patient denies dysuria, burning on urination, urinary frequency. He states that he has urge to urinate.  No flank pain.  He has nausea, no vomiting, diarrhea or abdominal pain.  He is constipated.  Patient does not have runny nose, sore throat, chest pain, cough, shortness of breath.  No unilateral weakness.  No hematuria or hematochezia.   Culture, Urine UrologyResulted: 09/13/2018 10:03 AM [facility]       Component Name Value Ref Range  Urine Urology Culture ID >100,000 CFU/ML Escherichia coli (A)    Specimen  Collected on    Urine - Clean Catch 09/11/2018 12:00 PM      Organism Antibiotic Method Susceptibility  Escherichia coli Organism ID MICRO MIC SUSCEPTIBILITY     Amoxicillin + K Clavulanate MICRO MIC SUSCEPTIBILITY 8/4: Susceptible   Ampicillin MICRO MIC SUSCEPTIBILITY >16: Resistant   Ampicillin/Sulbactam MICRO MIC SUSCEPTIBILITY >16/8: Resistant   Aztreonam MICRO MIC SUSCEPTIBILITY 16: Resistant   Cefazolin MICRO MIC SUSCEPTIBILITY >16: Resistant   Cefepime MICRO MIC SUSCEPTIBILITY >16: Resistant   Cefotaxime MICRO MIC SUSCEPTIBILITY >32: Resistant   Ceftriaxone MICRO MIC SUSCEPTIBILITY >32: Resistant   Cefuroxime MICRO MIC SUSCEPTIBILITY >16: Resistant   Ciprofloxacin MICRO MIC SUSCEPTIBILITY >2: Resistant   Ertapenem MICRO MIC SUSCEPTIBILITY <=0.5: Susceptible   Gentamicin MICRO MIC SUSCEPTIBILITY <=1: Susceptible   Meropenem MICRO MIC SUSCEPTIBILITY <=1: Susceptible   Nitrofurantoin MICRO MIC SUSCEPTIBILITY <=32: Susceptible   Piperacillin/Tazobactam MICRO MIC SUSCEPTIBILITY <=4: Susceptible   Tetracycline MICRO MIC SUSCEPTIBILITY >8: Resistant   Trimethoprim/Sulfamethoxazole MICRO MIC SUSCEPTIBILITY       ED Course: pt was found to have WBC 21, lactic acid 1.67, INR 1.79, urinalysis (cloudy appearance, negative leukocyte, RBC 21-50, rare bacteria, WBC 0-5), worsening renal function, temperature 99.4, no tachycardia, has tachypnea, oxygen saturation 90 to 96% on room air, chest x-ray negative.  Patient is admitted to stepdown as inpatient.  CT abdomen/pelvis showed: 1. No bowel obstruction. Extensive bowel content identified throughout colon consistent with constipation. 2. 1.9 cm lesion in the lower pole right  kidney, higher density than would be expected for simple cysts. Recommend further evaluation with renal ultrasound on outpatient basis.  Review of Systems:   General: has fevers, chills, no body weight gain, has poor appetite, has fatigue HEENT: no blurry vision, hearing  changes or sore throat Respiratory: no dyspnea, coughing, wheezing CV: no chest pain, no palpitations GI: has nausea, no vomiting, abdominal pain, diarrhea, has constipation GU: no dysuria, burning on urination, increased urinary frequency, hematuria  Ext: no leg edema Neuro: no unilateral weakness, numbness, or tingling, no vision change or hearing loss Skin: has rash in both legs, no skin tear. MSK: No muscle spasm, no deformity, no limitation of range of movement in spin Heme: No easy bruising.  Travel history: No recent long distant travel.  Allergy:  Allergies  Allergen Reactions  . Corticosteroids Shortness Of Breath    HICCUPS AND RESPIRATORY DISTRESS  . Methylprednisolone Shortness Of Breath    Pt tolerates po steroids  . Other Anaphylaxis    IV steroid  . Penicillins Rash  . Avocado     Other reaction(s): Other (See Comments) ORAL PEELING  . Macrobid [Nitrofurantoin]     Rash   . Cortisone     Hiccups for a week Hiccups for a week Hiccups for a week    Past Medical History:  Diagnosis Date  . Atrial fibrillation ([place])   . Cancer ([place])   . Hypertension   . Pneumonia   . TIA (transient ischemic attack)   . Whooping cough     Past Surgical History:  Procedure Laterality Date  . EYE SURGERY    . TONSILLECTOMY      Social History:  reports that he has never smoked. He has never used smokeless tobacco. He reports current alcohol use of about 2.0 standard drinks of alcohol per week. He reports that he does not use drugs.  Family History:  Family History  Problem Relation Age of Onset  . CAD Father   . Colon cancer Other      Prior to Admission medications   Medication Sig Start Date End Date Taking? Authorizing Provider  nitrofurantoin (MACRODANTIN) 100 MG capsule Take 100 mg by mouth 4 (four) times daily.   Yes [provider]  amiodarone (PACERONE) 200 MG tablet Take 1 tablet (200 mg total) by mouth daily. 10/18/16   Jettie Booze, MD    amLODipine [facility]) 5 MG tablet Please call to schedule yearly appointment for further refills [phone]. 05/30/17   Jettie Booze, MD  apixaban (ELIQUIS) 5 MG TABS tablet Take 1 tablet (5 mg total) by mouth 2 (two) times daily. 10/18/16   Jettie Booze, MD  Ascorbic Acid (VITAMIN C) 1000 MG tablet Take 1,000 mg by mouth 2 (two) times daily.    [provider]  atorvastatin (LIPITOR) 40 MG tablet Take 1 tablet (40 mg total) by mouth daily. 10/18/16   Jettie Booze, MD  Calcium Carbonate-Vit D-Min (CALCIUM 1200 PO) Take 1,200 mg by mouth 2 (two) times daily.    [provider]  Cholecalciferol (VITAMIN D3) 10000 UNITS capsule Take 10,000 Units by mouth 2 (two) times daily.    [provider]  Cyanocobalamin (VITAMIN B-12 PO) Take 5,000 mcg by mouth daily.    [provider]  eplerenone (INSPRA) 25 MG tablet Please call to schedule early appointment for further refills [phone] 05/30/17   Jettie Booze, MD  levothyroxine (SYNTHROID, LEVOTHROID) 25 MCG tablet Take 1 tablet (25 mcg total)  by mouth daily before breakfast. 08/16/16   Burtis Junes, NP  losartan (COZAAR) 50 MG tablet Take 1 tablet (50 mg total) by mouth daily. 10/18/16   Jettie Booze, MD  Misc Natural Products (GLUCOSAMINE CHONDROITIN TRIPLE PO) Take 1 tablet by mouth 2 (two) times daily.    [provider]    Physical Exam: Vitals:   09/18/18 1658 09/18/18 1700 09/18/18 1730 09/18/18 1845  BP: (!) 111/50 (!) 115/52 (!) 124/48 (!) 128/44  Pulse: (!) 57 (!) 56 (!) 57 61  Resp: 20 (!) 21 (!) 22 (!) 24  Temp: 99.3 F (37.4 C)   99.2 F (37.3 C)  TempSrc: Oral   Oral  SpO2: 90% 90% 96% 96%  Weight:    74.2 kg  Height:    5\' 6"  (1.676 m)   General: Not in acute distress HEENT:       Eyes: PERRL, EOMI, no scleral icterus.       ENT: No discharge from the ears and nose, no pharynx injection, no tonsillar enlargement.        Neck: No JVD, no  bruit, no mass felt. Heme: No neck lymph node enlargement. Cardiac: Q2/V9, RRR, 2/6 systolic murmurs, No gallops or rubs. Respiratory: No rales, wheezing, rhonchi or rubs. GI: Soft, nondistended, nontender, no rebound pain, no organomegaly, BS present. GU: No hematuria. No CVA tenderness. Ext: No pitting leg edema bilaterally. 2+DP/PT pulse bilaterally. Musculoskeletal: No joint deformities, No joint redness or warmth, no limitation of ROM in spin. Skin: has erythematous petechial rashes in both lower legs.  Neuro: Alert, oriented X3, cranial nerves II-XII grossly intact, moves all extremities normally. Psych: Patient is not psychotic, no suicidal or hemocidal ideation.  Labs on Admission: I have personally reviewed following labs and imaging studies  CBC: Recent Labs  Lab 09/18/18 1210  WBC 21.0*  NEUTROABS 19.1*  HGB 12.0*  HCT 38.9*  MCV 89.8  PLT 563   Basic Metabolic Panel: Recent Labs  Lab 09/18/18 1146  NA 139  K 4.0  CL 107  CO2 22  GLUCOSE 161*  BUN 31*  CREATININE 1.58*  CALCIUM 9.0   GFR: Estimated Creatinine Clearance: 29.7 mL/min (A) (by C-G formula based on SCr of 1.58 mg/dL (H)). Liver Function Tests: Recent Labs  Lab 09/18/18 1146  AST 20  ALT 17  ALKPHOS 59  BILITOT 1.3*  PROT 6.7  ALBUMIN 3.5   No results for input(s): LIPASE, AMYLASE in the last 168 hours. No results for input(s): AMMONIA in the last 168 hours. Coagulation Profile: Recent Labs  Lab 09/18/18 1146  INR 1.79   Cardiac Enzymes: No results for input(s): CKTOTAL, CKMB, CKMBINDEX, TROPONINI in the last 168 hours. BNP (last 3 results) No results for input(s): PROBNP in the last 8760 hours. HbA1C: No results for input(s): HGBA1C in the last 72 hours. CBG: No results for input(s): GLUCAP in the last 168 hours. Lipid Profile: No results for input(s): CHOL, HDL, LDLCALC, TRIG, CHOLHDL, LDLDIRECT in the last 72 hours. Thyroid Function Tests: No results for input(s): TSH,  T4TOTAL, FREET4, T3FREE, THYROIDAB in the last 72 hours. Anemia Panel: No results for input(s): VITAMINB12, FOLATE, FERRITIN, TIBC, IRON, RETICCTPCT in the last 72 hours. Urine analysis:    Component Value Date/Time   COLORURINE YELLOW 09/18/2018 1619   APPEARANCEUR CLOUDY (A) 09/18/2018 1619   LABSPEC <1.005 (L) 09/18/2018 1619   PHURINE 5.5 09/18/2018 1619   GLUCOSEU NEGATIVE 09/18/2018 1619   HGBUR LARGE (A) 09/18/2018 1619  BILIRUBINUR NEGATIVE 09/18/2018 [place] 09/18/2018 1619   PROTEINUR 30 (A) 09/18/2018 1619   UROBILINOGEN 1.0 06/03/2013 0505   NITRITE NEGATIVE 09/18/2018 1619   LEUKOCYTESUR NEGATIVE 09/18/2018 1619   Sepsis Labs: @LABRCNTIP (procalcitonin:4,lacticidven:4) )No results found for this or any previous visit (from the past 240 hour(s)).   Radiological Exams on Admission: Dg Chest 2 View  Result Date: 09/18/2018 CLINICAL DATA:  Fever and bilateral lower extremity swelling for a few days. EXAM: CHEST - 2 VIEW COMPARISON:  PA and lateral chest 06/03/2013 and 10/21/2007. FINDINGS: Lungs clear. Heart size upper normal. No pneumothorax or pleural effusion. No acute or focal bony abnormality. IMPRESSION: Negative chest. Electronically Signed   By: Inge Rise M.D.   On: 09/18/2018 11:52   Ct Abdomen Pelvis W Contrast  Result Date: 09/18/2018 CLINICAL DATA:  Constipation. EXAM: CT ABDOMEN AND PELVIS WITH CONTRAST TECHNIQUE: Multidetector CT imaging of the abdomen and pelvis was performed using the standard protocol following bolus administration of intravenous contrast. CONTRAST:  41mL ISOVUE-300 IOPAMIDOL (ISOVUE-300) INJECTION 61% COMPARISON:  None. FINDINGS: Lower chest: Mild dependent atelectasis of posterior lung bases are noted. Minimal bilateral posterior pleural effusions are noted. The heart size is enlarged. Hepatobiliary: The liver is normal without focal liver lesion. The gallbladder is not seen. The biliary tree is normal. Pancreas:  Unremarkable. No pancreatic ductal dilatation or surrounding inflammatory changes. Spleen: Normal in size without focal abnormality. Adrenals/Urinary Tract: The bilateral adrenal glands are normal. There is a 1.9 cm lesion at lower pole right kidney higher density than would be suspected for a simple cyst. Upper pole right kidney simple cyst is noted. The bladder is normal. Stomach/Bowel: Stomach is within normal limits. Appendix is not seen but no inflammation is noted around cecum. No evidence of bowel wall thickening, distention, or inflammatory changes. Vascular/Lymphatic: Aortic atherosclerosis. No enlarged abdominal or pelvic lymph nodes. Reproductive: Prostate calcifications are noted. Other: None. Musculoskeletal: Degenerative joint changes of the spine are noted. IMPRESSION: No bowel obstruction. Extensive bowel content identified throughout colon consistent with constipation. 1.9 cm lesion in the lower pole right kidney, higher density than would be expected for simple cysts. Recommend further evaluation with renal ultrasound on outpatient basis. Electronically Signed   By: Abelardo Diesel M.D.   On: 09/18/2018 13:27     EKG: Independently reviewed.  Sinus rhythm, QTC 568, low voltage, right bundle blockade which is old, RAD, early R wave progression  Assessment/Plan Principal Problem:   Sepsis due to undetermined organism [facility]) Active Problems:   Atrial fibrillation (HCC)   HTN (hypertension)   HLD (hyperlipidemia)   TIA (transient ischemic attack)   Chronic diastolic heart failure (HCC)   Acute renal failure superimposed on stage 2 chronic kidney disease (HCC)   QT prolongation   Rash   Constipation   Sepsis due to undetermined organism [facility]): Patient meets criteria for sepsis with fever, leukocytosis and tachypnea.  Lactic acid is normal.  Source of infection is not clear.  Chest x-ray negative.  No any respiratory symptoms.  No runny nose or sore for flu. Recently, pt had positive  urine culture for ESBL E. Coli. He received 160 mg of IM tobramycin, and currently being treated with macrobid. His UA is not impressive today, which can be falsely negative since patient is on antibiotics. Given his hx of recurrent low-grade noninvasive bladder cancer that was complicated by right ureteral obstruction (status post transurethral resection of the right UO and stent placement), I suspect pt may have  UTI vs early stage of pyelonephritis. Pt was given zosyn in ED. Due to recent positive ESBL, will change zosyn to meropenem.  Currently patient is hemodynamically stable.   - Admit to SDU as inpt -  Meropenem per pharm - Follow up results of urine and blood cx - prn hydroxyzine for nausea - will get Procalcitonin and trend lactic acid levels per sepsis protocol. - IVF: 1.75 L of NS bolus in ED (patient has a congestive heart failure, limiting aggressive IV fluids treatment).  Atrial Fibrillation (PAF): CHA2DS2-VASc Score is 6 , needs oral anticoagulation. Patient is on Eliquis at home. Heart rate is well controlled. -continue Eliquis, and amiodarone  HTN (hypertension): -Hold Cozaar, amlodipine since patient at high risk of developing hypotension due to sepsis -IV hydralazine as needed  HLD (hyperlipidemia) -Lipitor  TIA (transient ischemic attack) -Lipitor -Patient is on Eliquis for A. Fib  Chronic diastolic heart failure ([place]): 2D echo on 06/03/2013 showed EF 65-70% with grade 1 diastolic dysfunction.  Patient does not have leg edema or JVD.  No respiratory distress.  No pulmonary edema chest x-ray.  CHF is compensated. -Hold eplerenone due to sepsis -Check BP  Acute renal failure superimposed on stage 2 chronic kidney disease ([place]): Baseline Cre is ~1.0, pt's Cre is 1.58 and BUN 31 on admission. Likely due to possible UTI, dehydration and continuation of diuretics and ARB - IVF as above - Follow up renal function by BMP - Hold Cozaar, eplerenone  QT prolongation: QTc  568. -avoid using QT prolongation drugs, such as Zofran -Check magnesium level, keep magnesium>2.0  Rash: Possibly due to allergic reaction to Macrobid -d/ced Macrobid  Constipation: -MiraLAX and Senokot    Inpatient status:  # Patient requires inpatient status due to high intensity of service, high risk for further deterioration and high frequency of surveillance required.  I certify that at the point of admission it is my clinical judgment that the patient will require inpatient hospital care spanning beyond 2 midnights from the point of admission.  . This patient has multiple chronic comorbidities including atrial fibrillation on eliquis, HTN, TIA, HFpEF, CKD-II, HLD, hypothyroidism, recurrent low-grade noninvasive bladder cancer that was complicated by right ureteral obstruction (status post transurethral resection of the right UO and stent placement). .  . Now patient has presenting symptoms include fever, chills, urge to urinate . The worrisome physical exam findings include rash. CT scan showed 1.9 cm lesion in the lower pole right kidney, higher density than would be expected for simple cysts. . The initial radiographic and laboratory data are worrisome because of leukocytosis, worsening renal function, abnormal CT scan of abdomen/pelvis as above . Current medical needs: please see my assessment and plan . Predictability of an adverse outcome (risk): Patient has multiple comorbidities, including history of recurrent low-grade noninvasive bladder cancer that was complicated by right ureteral obstruction (status post transurethral resection of the right UO and stent placement).  He had positive urine culture for ESBL E. coli recently.  Now presents with sepsis with unclear source of infection, currently suspect urinary tract infection, but not 100% sure.  Will needed further work-up.  Patient is at high risk of deteriorating.  Will need to stay in hospital for at least 2  days.     DVT ppx: on Eliquis Code Status: Full code Family Communication:   Yes, patient's daughter at bed side Disposition Plan:  Anticipate discharge back to previous home environment Consults called:  none Admission status:  SDU/inpation  Date of Service 09/18/2018    [place] [facility]   If 7PM-7AM, please contact night-coverage [email or web address] Password [facility] 09/18/2018, 8:17 PM

## 2018-09-18 NOTE — ED Notes (Signed)
Pt still unable to provide a urine specimen. Bladder scan performed showing 320 mL.

## 2018-09-18 NOTE — ED Notes (Signed)
C/o fever, dry heaves,  Not feeing good  Has been treated for uti   States last time to md the ua was clear,  But is not feeling any better

## 2018-09-18 NOTE — [place] Notes (Signed)
Pharmacy Antibiotic Note  Stephen Hendrix is a 85 y.o. male admitted on 09/18/2018 with sepsis of unknown source. Recently had UA and culture with ESBL Jarrell has been consulted for merrem dosing.  Plan: merrem 1gm IV q12h Follow renal function, cultures and clinical course  Height: 5\' 6"  (167.6 cm) Weight: 163 lb 9.3 oz (74.2 kg) IBW/kg (Calculated) : 63.8  Temp (24hrs), Avg:99.3 F (37.4 C), Min:99.2 F (37.3 C), Max:99.4 F (37.4 C)  Recent Labs  Lab 09/18/18 1146 09/18/18 1201 09/18/18 1210  WBC  --   --  21.0*  CREATININE 1.58*  --   --   LATICACIDVEN  --  1.67  --     Estimated Creatinine Clearance: 29.7 mL/min (A) (by C-G formula based on SCr of 1.58 mg/dL (H)).    Allergies  Allergen Reactions  . Corticosteroids Shortness Of Breath    HICCUPS AND RESPIRATORY DISTRESS  . Methylprednisolone Shortness Of Breath    Pt tolerates po steroids  . Other Anaphylaxis    IV steroid  . Penicillins Rash  . Avocado     Other reaction(s): Other (See Comments) ORAL PEELING  . Macrobid [Nitrofurantoin]     Rash   . Cortisone     Hiccups for a week Hiccups for a week Hiccups for a week    Antimicrobials this admission: 1/24 zosyn x 1 1/24 merrem >> Dose adjustments this admission:   Microbiology results: 1/24 BCx:  1/24 UCx:  1/24 MRSA PCR:   Thank you for allowing pharmacy to be a part of this patient's care.  Dolly Rias RPh 09/18/2018, 8:07 PM Pager [phone]

## 2018-09-18 NOTE — [place] Notes (Signed)
Hospitalist transfer acceptance note  Per EDP at [facility]    Chief Complaint  Patient presents with  . Fever    HPI Stephen Hendrix is a 85 y.o. male with a hx of atrial fibrillation on eliquis, HTN, prior TIA, HFpEF, and recurrent low-grade noninvasive bladder cancer that was complicated by right ureteral obstruction status post transurethral resection of the right UO and stent placement who presents to the ED with chief complaint of fever. Patient has not been feeling well for the past 1 month. He states that he has felt generally poor & somewhat weak. He was in [place] when sxs ensued with dysuria and malodorous urine- had UA & culture with ESBL E. Coli- did not receive abx, and was seen by urologist Dr. Nevada Crane w/ [facility] 09/11/18- had UA w/ culture. Given 160 mg of IM tobramycin and prescription for macrobid 100 mg BID. Taking as prescribed with improvement of urinary sxs, but no change in general malaise, nausea, and now w/ dry heaving. He also has had fever w/ temp max at home of 101 with chills. Denies URI sxs, chest pain, dyspnea, flank pain, or abdominal pain. He states he did have some issues with constipation but utilized an enema w/ improvement, last BM yesterday was loose.   His physical exam is significant for left lower quadrant tenderness.  CBC with Differential/Platelet [[id number]] (Abnormal)   Collected: 09/18/18 1210   Updated: 09/18/18 1245    WBC 21.0High  K/uL   RBC 4.33 MIL/uL   Hemoglobin 12.0Low  g/dL   HCT 38.9Low  %   MCV 89.8 fL   MCH 27.7 pg   MCHC 30.8 g/dL   RDW 15.5 %   Platelets 294 K/uL   nRBC 0.0 %   Neutrophils Relative % 91 %   Neutro Abs 19.1High  K/uL   Lymphocytes Relative 2 %   Lymphs Abs 0.4Low  K/uL   Monocytes Relative 4 %   Monocytes Absolute 0.9 K/uL   Eosinophils Relative 2 %   Eosinophils Absolute 0.4 K/uL   Basophils Relative 0 %   Basophils Absolute 0.0 K/uL   Immature Granulocytes 1 %   Abs Immature  Granulocytes 0.25High  K/uL  Comprehensive metabolic panel [[id number]] (Abnormal)   Collected: 09/18/18 1146   Updated: 09/18/18 1235   Specimen Type: Blood    Sodium 139 mmol/L   Potassium 4.0 mmol/L   Chloride 107 mmol/L   CO2 22 mmol/L   Glucose, Bld 161High  mg/dL   BUN 31High  mg/dL   Creatinine, Ser 1.58High  mg/dL   Calcium 9.0 mg/dL   Total Protein 6.7 g/dL   Albumin 3.5 g/dL   AST 20 U/L   ALT 17 U/L   Alkaline Phosphatase 59 U/L   Total Bilirubin 1.3High  mg/dL   GFR calc non Af Amer 39Low  mL/min   GFR calc Af Amer 45Low  mL/min   Anion gap 10  Protime-INR [[id number]] (Abnormal)   Collected: 09/18/18 1146   Updated: 09/18/18 1228   Specimen Type: Blood    Prothrombin Time 20.6High  seconds   INR 1.79     Culture, Urine UrologyResulted: 09/13/2018 10:03 AM [facility] Component Name Value Ref Range  Urine Urology Culture ID >[id number] CFU/ML Escherichia coli (A)   Specimen Collected on  Urine - Clean Catch 09/11/2018 12:00 PM   Organism Antibiotic Method Susceptibility  Escherichia coli Organism ID MICRO MIC SUSCEPTIBILITY    Amoxicillin + K  Clavulanate MICRO MIC SUSCEPTIBILITY 8/4: Susceptible   Ampicillin MICRO MIC SUSCEPTIBILITY >16: Resistant   Ampicillin/Sulbactam MICRO MIC SUSCEPTIBILITY >16/8: Resistant   Aztreonam MICRO MIC SUSCEPTIBILITY 16: Resistant   Cefazolin MICRO MIC SUSCEPTIBILITY >16: Resistant   Cefepime MICRO MIC SUSCEPTIBILITY >16: Resistant   Cefotaxime MICRO MIC SUSCEPTIBILITY >32: Resistant   Ceftriaxone MICRO MIC SUSCEPTIBILITY >32: Resistant   Cefuroxime MICRO MIC SUSCEPTIBILITY >16: Resistant   Ciprofloxacin MICRO MIC SUSCEPTIBILITY >2: Resistant   Ertapenem MICRO MIC SUSCEPTIBILITY <=0.5: Susceptible   Gentamicin MICRO MIC SUSCEPTIBILITY <=1: Susceptible   Meropenem MICRO MIC SUSCEPTIBILITY <=1: Susceptible   Nitrofurantoin MICRO MIC SUSCEPTIBILITY <=32: Susceptible   Piperacillin/Tazobactam MICRO MIC  SUSCEPTIBILITY <=4: Susceptible   Tetracycline MICRO MIC SUSCEPTIBILITY >8: Resistant   Trimethoprim/Sulfamethoxazole MICRO MIC SUSCEPTIBILITY >2/38: Resistant  Comment:  1) Results of ampicillin testing can be used to predict results for amoxicillin  2) In the treatment of uncomplicated UTIs, cefazolin susceptibility predicts susceptibility to the oral cephalosporins cephalexin, cefuroxime, cefpodoxime, and cefdinir. Cephalexin is cost-effective, but QID dosing (normal renal function) is less convenient than the other oral cephalosporins, which are dosed BID. Isolates resistant to cefazolin but susceptible to ceftriaxone may be susceptible to cefpodoxime.  Accepted to stepdown bed for sepsis due to undetermined organism, but presumptively for ESBL E. coli.  He received Zosyn in the emergency department.  Urinalysis still pending.  Tennis Must, MD.

## 2018-09-18 NOTE — ED Provider Notes (Signed)
[place] EMERGENCY DEPARTMENT Provider Note   CSN: [id number] Arrival date & time: 09/18/18  1131     History   Chief Complaint Chief Complaint  Patient presents with  . Fever    HPI Maykel Reitter is a 85 y.o. male with a hx of atrial fibrillation on eliquis, HTN, prior TIA, HFpEF, and recurrent low-grade noninvasive bladder cancer that was complicated by right ureteral obstruction status post transurethral resection of the right UO and stent placement who presents to the ED with chief complaint of fever. Patient has not been feeling well for the past 1 month. He states that he has felt generally poor & somewhat weak. He was in [place] when sxs ensued with dysuria and malodorous urine- had UA & culture with ESBL E. Coli- did not receive abx, and was seen by urologist Dr. Nevada Crane w/ [facility] 09/11/18- had UA w/ culture. Given 160 mg of IM tobramycin and prescription for macrobid 100 mg BID. Taking as prescribed with improvement of urinary sxs, but no change in general malaise, nausea, and now w/ dry heaving. He also has had fever w/ temp max at home of 101 with chills. Denies URI sxs, chest pain, dyspnea, flank pain, or abdominal pain. He states he did have some issues with constipation but utilized an enema w/ improvement, last BM yesterday was loose.    HPI  Past Medical History:  Diagnosis Date  . Atrial fibrillation ([place])   . Cancer ([place])   . Hypertension   . Pneumonia   . TIA (transient ischemic attack)   . Whooping cough     Patient Active Problem List   Diagnosis Date Noted  . Bradycardia 02/08/2014  . TIA (transient ischemic attack) 06/04/2013  . Chronic diastolic heart failure ([place]) 06/04/2013  . Vision changes 06/03/2013  . Headache 06/03/2013  . Atrial fibrillation ([place]) 06/03/2013  . HTN (hypertension) 06/03/2013  . HLD (hyperlipidemia) 06/03/2013    Past Surgical History:  Procedure Laterality Date  . EYE SURGERY    . TONSILLECTOMY           Home Medications    Prior to Admission medications   Medication Sig Start Date End Date Taking? Authorizing Provider  nitrofurantoin (MACRODANTIN) 100 MG capsule Take 100 mg by mouth 4 (four) times daily.   Yes [provider]  amiodarone (PACERONE) 200 MG tablet Take 1 tablet (200 mg total) by mouth daily. 10/18/16   Jettie Booze, MD  amLODipine [facility]) 5 MG tablet Please call to schedule yearly appointment for further refills [phone]. 05/30/17   Jettie Booze, MD  apixaban (ELIQUIS) 5 MG TABS tablet Take 1 tablet (5 mg total) by mouth 2 (two) times daily. 10/18/16   Jettie Booze, MD  Ascorbic Acid (VITAMIN C) 1000 MG tablet Take 1,000 mg by mouth 2 (two) times daily.    [provider]  atorvastatin (LIPITOR) 40 MG tablet Take 1 tablet (40 mg total) by mouth daily. 10/18/16   Jettie Booze, MD  Calcium Carbonate-Vit D-Min (CALCIUM 1200 PO) Take 1,200 mg by mouth 2 (two) times daily.    [provider]  Cholecalciferol (VITAMIN D3) 10000 UNITS capsule Take 10,000 Units by mouth 2 (two) times daily.    [provider]  Cyanocobalamin (VITAMIN B-12 PO) Take 5,000 mcg by mouth daily.    [provider]  eplerenone (INSPRA) 25 MG tablet Please call to schedule early appointment for further refills [phone] 05/30/17   Jettie Booze,  MD  levothyroxine (SYNTHROID, LEVOTHROID) 25 MCG tablet Take 1 tablet (25 mcg total) by mouth daily before breakfast. 08/16/16   Burtis Junes, NP  losartan (COZAAR) 50 MG tablet Take 1 tablet (50 mg total) by mouth daily. 10/18/16   Jettie Booze, MD  Misc Natural Products (GLUCOSAMINE CHONDROITIN TRIPLE PO) Take 1 tablet by mouth 2 (two) times daily.    [provider]    Family History Family History  Problem Relation Age of Onset  . CAD Father   . Colon cancer Other     Social History Social History   Tobacco Use  . Smoking status: Never  Smoker  . Smokeless tobacco: Never Used  Substance Use Topics  . Alcohol use: Yes    Alcohol/week: 2.0 standard drinks    Types: 2 Glasses of wine per week  . Drug use: No     Allergies   Corticosteroids; Methylprednisolone; Other; Penicillins; Avocado; and Cortisone   Review of Systems Review of Systems  Constitutional: Positive for chills, fatigue and fever.  HENT: Negative for congestion and sore throat.   Respiratory: Negative for cough and shortness of breath.   Cardiovascular: Negative for chest pain.  Gastrointestinal: Positive for constipation and nausea. Negative for abdominal pain, anal bleeding and blood in stool.  Genitourinary: Positive for dysuria (resolved). Negative for flank pain and hematuria.  Neurological: Positive for weakness.  All other systems reviewed and are negative.    Physical Exam Updated Vital Signs BP (!) 110/57   Pulse 81   Temp 99.4 F (37.4 C) (Oral)   Resp 20   Ht 5\' 6"  (1.676 m)   Wt 70.3 kg   SpO2 93%   BMI 25.02 kg/m   Physical Exam Vitals signs and nursing note reviewed.  Constitutional:      General: He is not in acute distress.    Appearance: He is well-developed. He is not toxic-appearing.  HENT:     Head: Normocephalic and atraumatic.  Eyes:     General:        Right eye: No discharge.        Left eye: No discharge.     Conjunctiva/sclera: Conjunctivae normal.  Neck:     Musculoskeletal: Neck supple.  Cardiovascular:     Rate and Rhythm: Normal rate and regular rhythm.  Pulmonary:     Effort: Pulmonary effort is normal. No respiratory distress.     Breath sounds: Normal breath sounds. No wheezing, rhonchi or rales.  Abdominal:     General: There is no distension.     Palpations: Abdomen is soft.     Tenderness: There is abdominal tenderness (LLQ). There is no right CVA tenderness, left CVA tenderness, guarding or rebound.  Skin:    General: Skin is warm and dry.     Findings: No rash.  Neurological:      Mental Status: He is alert.     Comments: Clear speech.   Psychiatric:        Behavior: Behavior normal.    ED Treatments / Results  Labs (all labs ordered are listed, but only abnormal results are displayed) Labs Reviewed  COMPREHENSIVE METABOLIC PANEL - Abnormal; Notable for the following components:      Result Value   Glucose, Bld 161 (*)    BUN 31 (*)    Creatinine, Ser 1.58 (*)    Total Bilirubin 1.3 (*)    GFR calc non Af Amer 39 (*)    GFR calc Af  Amer 45 (*)    All other components within normal limits  PROTIME-INR - Abnormal; Notable for the following components:   Prothrombin Time 20.6 (*)    All other components within normal limits  CBC WITH DIFFERENTIAL/PLATELET - Abnormal; Notable for the following components:   WBC 21.0 (*)    Hemoglobin 12.0 (*)    HCT 38.9 (*)    Neutro Abs 19.1 (*)    Lymphs Abs 0.4 (*)    Abs Immature Granulocytes 0.25 (*)    All other components within normal limits  CULTURE, BLOOD (ROUTINE X 2)  CULTURE, BLOOD (ROUTINE X 2)  LACTIC ACID, PLASMA  LACTIC ACID, PLASMA  CBC WITH DIFFERENTIAL/PLATELET  URINALYSIS, ROUTINE W REFLEX MICROSCOPIC  I-STAT CG4 LACTIC ACID, ED    EKG None  Radiology Dg Chest 2 View  Result Date: 09/18/2018 CLINICAL DATA:  Fever and bilateral lower extremity swelling for a few days. EXAM: CHEST - 2 VIEW COMPARISON:  PA and lateral chest 06/03/2013 and 10/21/2007. FINDINGS: Lungs clear. Heart size upper normal. No pneumothorax or pleural effusion. No acute or focal bony abnormality. IMPRESSION: Negative chest. Electronically Signed   By: Inge Rise M.D.   On: 09/18/2018 11:52   Ct Abdomen Pelvis W Contrast  Result Date: 09/18/2018 CLINICAL DATA:  Constipation. EXAM: CT ABDOMEN AND PELVIS WITH CONTRAST TECHNIQUE: Multidetector CT imaging of the abdomen and pelvis was performed using the standard protocol following bolus administration of intravenous contrast. CONTRAST:  66mL ISOVUE-300 IOPAMIDOL  (ISOVUE-300) INJECTION 61% COMPARISON:  None. FINDINGS: Lower chest: Mild dependent atelectasis of posterior lung bases are noted. Minimal bilateral posterior pleural effusions are noted. The heart size is enlarged. Hepatobiliary: The liver is normal without focal liver lesion. The gallbladder is not seen. The biliary tree is normal. Pancreas: Unremarkable. No pancreatic ductal dilatation or surrounding inflammatory changes. Spleen: Normal in size without focal abnormality. Adrenals/Urinary Tract: The bilateral adrenal glands are normal. There is a 1.9 cm lesion at lower pole right kidney higher density than would be suspected for a simple cyst. Upper pole right kidney simple cyst is noted. The bladder is normal. Stomach/Bowel: Stomach is within normal limits. Appendix is not seen but no inflammation is noted around cecum. No evidence of bowel wall thickening, distention, or inflammatory changes. Vascular/Lymphatic: Aortic atherosclerosis. No enlarged abdominal or pelvic lymph nodes. Reproductive: Prostate calcifications are noted. Other: None. Musculoskeletal: Degenerative joint changes of the spine are noted. IMPRESSION: No bowel obstruction. Extensive bowel content identified throughout colon consistent with constipation. 1.9 cm lesion in the lower pole right kidney, higher density than would be expected for simple cysts. Recommend further evaluation with renal ultrasound on outpatient basis. Electronically Signed   By: Abelardo Diesel M.D.   On: 09/18/2018 13:27    Procedures Procedures (including critical care time)  CRITICAL CARE Performed by: Kennith Maes   Total critical care time: 35 minutes  Critical care time was exclusive of separately billable procedures and treating other patients.  Critical care was necessary to treat or prevent imminent or life-threatening deterioration.  Critical care was time spent personally by me on the following activities: development of treatment plan with  patient and/or surrogate as well as nursing, discussions with consultants, evaluation of patient's response to treatment, examination of patient, obtaining history from patient or surrogate, ordering and performing treatments and interventions, ordering and review of laboratory studies, ordering and review of radiographic studies, pulse oximetry and re-evaluation of patient's condition.  Medications Ordered in ED Medications  sodium chloride  flush (NS) 0.9 % injection 3 mL (has no administration in time range)     Initial Impression / Assessment and Plan / ED Course  I have reviewed the triage vital signs and the nursing notes.  Pertinent labs & imaging results that were available during my care of the patient were reviewed by me and considered in my medical decision making (see chart for details).   11:40: Initial evaluation: Patient is an 85 year old male currently being treated for a UTI on Macrobid who presents to the emergency department with fever, chills, nausea, and dry heaving.  He is nontoxic appearing, no apparent distress, his vitals are without significant abnormalities.  On exam he does have some left lower quadrant abdominal tenderness without peritoneal signs.  Given no improvement on current antibiotics there is concern for sepsis, blood cultures & lactic acid obtained per triage team, will start on Zosyn (patient with penicillin allergy of rash, no history of anaphylaxis), prior culture reviewed susceptible to Zosyn therefore this was selected. Fluids ordered. Initial lactic acid per triage is not elevated, initial BP w/ systolic not below 90, MAP not below 65 discussed with Dr. Tyrone Nine recommends holding off on code sepsis w/ 30cc/kg bolus at this time and monitor closely, in agreement for abx and work-up thus far.   13:20: RN informed me MAP below 65- Re-disucssed with Dr. Tyrone Nine: Code sepsis called w/ additional fluids ordered to meet 30 cc/kg bolus.   13:40: BP 127/53 with MAP of  75 prior to additional fluids, has received 1 L thus far.   Work-up thus far reviewed: CBC: Leukocytosis at 21.0 with left shift.  Anemia with hemoglobin of 12.0-no prior on record within the past year, patient reports that this was noted to be low the last time he had blood work checked is unsure of exact number. CMP: Elevated BUN/creatinine at 31/1.58 respectively, again no recent on record for comparison, receiving fluids in the ER. Chest x-ray: Unremarkable CT abdomen pelvis: Notable for constipation and 1.9 cm renal lesion recommended for further evaluation w/ outpatient ultrasound.  Urinalysis remains pending.  At this time suspect UTI as source given history with failed outpatient therapy.  Will place consult for hospitalist admission.  14:38: CONSULT: Discussed case with hospitalist Dr. Olevia Bowens who accepts admission and is placing bed request.    Findings and plan of care discussed with supervising physician Dr. Tyrone Nine who personally evaluated patient and provided guidance in management and plan for admission.   Final Clinical Impressions(s) / ED Diagnoses   Final diagnoses:  Sepsis, due to unspecified organism, unspecified whether acute organ dysfunction present [facility])    ED Discharge Orders    None       Amaryllis Dyke, PA-C 09/18/18 [place], [place], DO 09/19/18 [phone]

## 2018-09-18 NOTE — ED Notes (Signed)
Pt was able to void on his own at this time.

## 2018-09-18 NOTE — ED Notes (Signed)
Pt's SpO2 periodically dropping to 89% on R/A. Pt placed back on 2L O2 via N/C

## 2018-09-19 ENCOUNTER — Other Ambulatory Visit: Payer: Self-pay

## 2018-09-19 DIAGNOSIS — K5900 Constipation, unspecified: Secondary | ICD-10-CM

## 2018-09-19 DIAGNOSIS — R21 Rash and other nonspecific skin eruption: Secondary | ICD-10-CM

## 2018-09-19 LAB — CBC
HCT: 37.4 % — ABNORMAL LOW (ref 39.0–52.0)
Hemoglobin: 11.5 g/dL — ABNORMAL LOW (ref 13.0–17.0)
MCH: 28.2 pg (ref 26.0–34.0)
MCHC: 30.7 g/dL (ref 30.0–36.0)
MCV: 91.7 fL (ref 80.0–100.0)
Platelets: 300 10*3/uL (ref 150–400)
RBC: 4.08 MIL/uL — ABNORMAL LOW (ref 4.22–5.81)
RDW: 15.9 % — ABNORMAL HIGH (ref 11.5–15.5)
WBC: 14.9 10*3/uL — ABNORMAL HIGH (ref 4.0–10.5)
nRBC: 0 % (ref 0.0–0.2)

## 2018-09-19 LAB — URINE CULTURE: Culture: NO GROWTH

## 2018-09-19 LAB — BASIC METABOLIC PANEL
Anion gap: 8 (ref 5–15)
BUN: 28 mg/dL — ABNORMAL HIGH (ref 8–23)
CO2: 22 mmol/L (ref 22–32)
Calcium: 8 mg/dL — ABNORMAL LOW (ref 8.9–10.3)
Chloride: 112 mmol/L — ABNORMAL HIGH (ref 98–111)
Creatinine, Ser: 1.36 mg/dL — ABNORMAL HIGH (ref 0.61–1.24)
GFR calc non Af Amer: 46 mL/min — ABNORMAL LOW (ref >60)
GFR, EST AFRICAN AMERICAN: 54 mL/min — AB (ref >60)
Glucose, Bld: 81 mg/dL (ref 70–99)
Potassium: 3.9 mmol/L (ref 3.5–5.1)
SODIUM: 142 mmol/L (ref 135–145)

## 2018-09-19 MED ORDER — CALCIUM CARBONATE-VITAMIN D 500-200 MG-UNIT PO TABS
1.0000 | ORAL_TABLET | Freq: Every day | ORAL | Status: DC
  Administered 2018-09-19: 1 via ORAL
  Filled 2018-09-19 (×3): qty 1

## 2018-09-19 MED ORDER — SPIRONOLACTONE 25 MG PO TABS
25.0000 mg | ORAL_TABLET | Freq: Every day | ORAL | Status: DC
  Administered 2018-09-19 – 2018-09-21 (×3): 25 mg via ORAL
  Filled 2018-09-19 (×4): qty 1

## 2018-09-19 NOTE — [place] Notes (Signed)
Patient transferred from ICU. VSS and tele applied. Denies any needs at this time. Will continue to monitor.

## 2018-09-19 NOTE — Progress Notes (Signed)
PROGRESS NOTE  Dontarious Hendrix  AQT:[id number] DOB: 13-Oct-1928 DOA: 09/18/2018 PCP: Martinique, Betty G, MD   Brief Narrative: Stephen Hendrix is a 85 y.o. male with medical history significant of atrial fibrillation on eliquis, HTN, TIA, HFpEF, CKD-II, HLD, hypothyroidism, recurrent low-grade noninvasive bladder cancer that was complicated by right ureteral obstruction (status post transurethral resection of the right UO and stent placement), who presents with fever and chill  Pt states that he has not been feeling well for several weeks. He states that he has felt generally poor & somewhat weak. He was in [place] when sxs ensued with dysuria and malodorous urine. He had UA & culture which was positive ESBL E. Coli. He did not receive abx there. He came back to [place]. He was seen by his urologist Dr. Nevada Crane w/ [name] 09/11/18. He had UA w/ culture. Given 160 mg of IM tobramycin and prescription for macrobid 100 mg BID. Taking as prescribed with improvement of urinary sxs, but no change in general malaise, nausea, and now w/ dry heaving.  Patient states that he developed rash in both lower legs 3 days after taking Macrobid. He developed fever and chills and had temperature 101 at home last night.  Patient denies dysuria, burning on urination, urinary frequency. He states that he has urge to urinate.  No flank pain.  He has nausea, no vomiting, diarrhea or abdominal pain.  He is constipated.  Patient does not have runny nose, sore throat, chest pain, cough, shortness of breath.  No unilateral weakness.  No hematuria or hematochezia.   Assessment & Plan: Principal Problem:   Sepsis due to undetermined organism [facility]) Active Problems:   Atrial fibrillation (HCC)   HTN (hypertension)   HLD (hyperlipidemia)   TIA (transient ischemic attack)   Chronic diastolic heart failure (HCC)   Acute renal failure superimposed on stage 2 chronic kidney disease (HCC)   QT prolongation   Rash   Constipation     Sepsis: Presumably due to untreated/undertreated ESBL E. coli. Macrobid could be expected to fail if patient had upper tract involvement. UA here likely falsely bland. WBC improving. - Continue meropenem - Monitor urine and blood cultures - Hemodynamically stabilizing. Note wide pulse pressures present in outpatient setting as well. - Crackles on exam. Stop IVF, restart home eplerenone.  Paroxysmal AFib, sinus bradycardia: - Continue telemetry - CHA2DS2-VASc Score is 6, continue eliquis - Continue amiodarone  HTN: - Holding cozaar, norvasc due to hypotension - IV hydralazine as needed  HLD:  - Continue statin  TIA:  - Continue eliquis, statin  Chronic diastolic heart failure: Echo on 06/03/2013 showed EF 65-70%, G1DD. Received IVF's for sepsis now has crackles. BNP 254. - Restart home eplerenone (formulary equivalent is spiro) and monitor I/O, daily weights. May need lasix, though has weaned from supplemental oxygen, so will hold for now.  Acute renal failure superimposed on stage 2 chronic kidney disease ([place]): Baseline Cre is ~1.0, pt's Cre is 1.58 and BUN 31 on admission. Likely due to possible UTI, dehydration and continuation of diuretics and ARB - Follow up renal function by BMP - Hold cozaar   QT prolongation: QTc 568. - Avoid provocative medications as able - Maintain adequate magnesemia.  Rash: Most likely drug eruption (low risk) due to macrobid.  - Stopped macrobid.   Constipation: - Resolved. Continue bowel regimen.  DVT prophylaxis: Eliquis Code Status: Full Family Communication: Grandson at bedside Disposition Plan: Home once stabilized.  Consultants:   None  Procedures:  None  Antimicrobials:  Meropenem   Subjective: Feels better, no dyspnea or pain. Does not feel febrile or chills. Takes a long time to sit up in bed unassisted and states this is not how weak he usually is. This has been worsening for about a month  now.  Objective: Vitals:   09/19/18 1000 09/19/18 1100 09/19/18 1200 09/19/18 1300  BP: (!) 130/44 (!) 125/34 (!) 120/40 (!) 127/43  Pulse: 62 (!) 57 (!) 52 (!) 55  Resp: 11 17 18 15   Temp:   98.5 F (36.9 C)   TempSrc:   Oral   SpO2: 99% 96% 98% 99%  Weight:      Height:        Intake/Output Summary (Last 24 hours) at 09/19/2018 1539 Last data filed at 09/19/2018 1300 Gross per 24 hour  Intake 564.17 ml  Output 1475 ml  Net -910.83 ml   Filed Weights   09/18/18 1134 09/18/18 1845  Weight: 70.3 kg 74.2 kg    Gen: Pleasant elderly, WDWN male in no distress Pulm: Non-labored breathing supplemental oxygen, bibasilar crackles noted. CV: Regular rate and rhythm, ~60bpm. No murmur, rub, or gallop. NO JVD, no pedal edema. GI: Abdomen soft, non-tender, non-distended, with normoactive bowel sounds. No organomegaly or masses felt. Ext: Warm, no deformities Skin: No rashes, lesions or ulcers Neuro: Alert and oriented. No focal neurological deficits. Psych: Judgement and insight appear normal. Mood & affect appropriate.   Data Reviewed: I have personally reviewed following labs and imaging studies  CBC: Recent Labs  Lab 09/18/18 1210 09/19/18 0323  WBC 21.0* 14.9*  NEUTROABS 19.1*  --   HGB 12.0* 11.5*  HCT 38.9* 37.4*  MCV 89.8 91.7  PLT 294 619   Basic Metabolic Panel: Recent Labs  Lab 09/18/18 1146 09/18/18 2021 09/19/18 0323  NA 139  --  142  K 4.0  --  3.9  CL 107  --  112*  CO2 22  --  22  GLUCOSE 161*  --  81  BUN 31*  --  28*  CREATININE 1.58*  --  1.36*  CALCIUM 9.0  --  8.0*  MG  --  2.0  --    GFR: Estimated Creatinine Clearance: 34.5 mL/min (A) (by C-G formula based on SCr of 1.36 mg/dL (H)). Liver Function Tests: Recent Labs  Lab 09/18/18 1146  AST 20  ALT 17  ALKPHOS 59  BILITOT 1.3*  PROT 6.7  ALBUMIN 3.5   No results for input(s): LIPASE, AMYLASE in the last 168 hours. No results for input(s): AMMONIA in the last 168  hours. Coagulation Profile: Recent Labs  Lab 09/18/18 1146  INR 1.79   Cardiac Enzymes: No results for input(s): CKTOTAL, CKMB, CKMBINDEX, TROPONINI in the last 168 hours. BNP (last 3 results) No results for input(s): PROBNP in the last 8760 hours. HbA1C: No results for input(s): HGBA1C in the last 72 hours. CBG: No results for input(s): GLUCAP in the last 168 hours. Lipid Profile: No results for input(s): CHOL, HDL, LDLCALC, TRIG, CHOLHDL, LDLDIRECT in the last 72 hours. Thyroid Function Tests: No results for input(s): TSH, T4TOTAL, FREET4, T3FREE, THYROIDAB in the last 72 hours. Anemia Panel: No results for input(s): VITAMINB12, FOLATE, FERRITIN, TIBC, IRON, RETICCTPCT in the last 72 hours. Urine analysis:    Component Value Date/Time   COLORURINE YELLOW 09/18/2018 1619   APPEARANCEUR CLOUDY (A) 09/18/2018 1619   LABSPEC <1.005 (L) 09/18/2018 1619   PHURINE 5.5 09/18/2018 1619   GLUCOSEU NEGATIVE 09/18/2018  [place] (A) 09/18/2018 [place] 09/18/2018 1619   [place] 09/18/2018 1619   PROTEINUR 30 (A) 09/18/2018 1619   UROBILINOGEN 1.0 06/03/2013 0505   NITRITE NEGATIVE 09/18/2018 1619   LEUKOCYTESUR NEGATIVE 09/18/2018 1619   Recent Results (from the past 240 hour(s))  Culture, blood (Routine x 2)     Status: None (Preliminary result)   Collection Time: 09/18/18 12:00 PM  Result Value Ref Range Status   Specimen Description   Final    RIGHT ANTECUBITAL Performed at [facility], [address] [place], [place] [address]    Special Requests   Final    BOTTLES DRAWN AEROBIC AND ANAEROBIC Blood Culture adequate volume Performed at [facility], [address] [place], [place] [address]    Culture   Final    NO GROWTH < 24 HOURS Performed at [facility], [place] [address], [place], [place] [address]    Report Status PENDING  Incomplete  Culture, blood (Routine x 2)     Status: None (Preliminary  result)   Collection Time: 09/18/18 12:10 PM  Result Value Ref Range Status   Specimen Description   Final    BLOOD LEFT FOREARM Performed at [facility], [address] [place], [place] [address]    Special Requests   Final    BOTTLES DRAWN AEROBIC AND ANAEROBIC Blood Culture adequate volume Performed at [facility], [address] [place], [place] [address]    Culture   Final    NO GROWTH < 24 HOURS Performed at [facility], [place] [address], [place], [place] [address]    Report Status PENDING  Incomplete  Urine culture     Status: None   Collection Time: 09/18/18  4:19 PM  Result Value Ref Range Status   Specimen Description URINE, CLEAN CATCH  Final   Special Requests   Final    NONE Performed at [facility], [address] [place], [place] [address]    Culture NO GROWTH  Final   Report Status 09/19/2018 FINAL  Final  MRSA PCR Screening     Status: None   Collection Time: 09/18/18  6:37 PM  Result Value Ref Range Status   MRSA by PCR NEGATIVE NEGATIVE Final    Comment:        The GeneXpert MRSA Assay (FDA approved for NASAL specimens only), is one component of a comprehensive MRSA colonization surveillance program. It is not intended to diagnose MRSA infection nor to guide or monitor treatment for MRSA infections. Performed at [facility], [place] [address] [place],  [address]       Radiology Studies: Dg Chest 2 View  Result Date: 09/18/2018 CLINICAL DATA:  Fever and bilateral lower extremity swelling for a few days. EXAM: CHEST - 2 VIEW COMPARISON:  PA and lateral chest 06/03/2013 and 10/21/2007. FINDINGS: Lungs clear. Heart size upper normal. No pneumothorax or pleural effusion. No acute or focal bony abnormality. IMPRESSION: Negative chest. Electronically Signed   By: Inge Rise M.D.   On: 09/18/2018 11:52   Ct Abdomen Pelvis W Contrast  Result Date: 09/18/2018 CLINICAL DATA:   Constipation. EXAM: CT ABDOMEN AND PELVIS WITH CONTRAST TECHNIQUE: Multidetector CT imaging of the abdomen and pelvis was performed using the standard protocol following bolus administration of intravenous contrast. CONTRAST:  62mL ISOVUE-300 IOPAMIDOL (ISOVUE-300) INJECTION 61% COMPARISON:  None. FINDINGS: Lower chest: Mild dependent atelectasis of posterior lung bases are noted. Minimal bilateral posterior pleural effusions are noted. The heart size is enlarged. Hepatobiliary: The liver is normal without focal liver lesion. The gallbladder is not seen. The biliary tree is normal. Pancreas: Unremarkable. No pancreatic ductal dilatation or surrounding inflammatory changes. Spleen: Normal in size without focal abnormality. Adrenals/Urinary Tract: The bilateral adrenal glands are normal. There is a 1.9 cm lesion at lower pole right kidney higher density than would be suspected for a simple cyst. Upper pole right kidney simple cyst is noted. The bladder is normal. Stomach/Bowel: Stomach is within normal limits. Appendix is not seen but no inflammation is noted around cecum. No evidence of bowel wall thickening, distention, or inflammatory changes. Vascular/Lymphatic: Aortic atherosclerosis. No enlarged abdominal or pelvic lymph nodes. Reproductive: Prostate calcifications are noted. Other: None. Musculoskeletal: Degenerative joint changes of the spine are noted. IMPRESSION: No bowel obstruction. Extensive bowel content identified throughout colon consistent with constipation. 1.9 cm lesion in the lower pole right kidney, higher density than would be expected for simple cysts. Recommend further evaluation with renal ultrasound on outpatient basis. Electronically Signed   By: Abelardo Diesel M.D.   On: 09/18/2018 13:27    Scheduled Meds: . acidophilus  1 capsule Oral Daily  . amiodarone  200 mg Oral Daily  . apixaban  5 mg Oral BID  . atorvastatin  40 mg Oral Daily  . calcium-vitamin D  1 tablet Oral Q breakfast   . cholecalciferol  1,000 Units Oral Daily  . levothyroxine  25 mcg Oral QAC breakfast  . loratadine  10 mg Oral Daily  . mouth rinse  15 mL Mouth Rinse BID  . multivitamin-iron-minerals-folic acid  1 tablet Oral Daily  . senna  1 tablet Oral Daily  . vitamin B-12  1,000 mcg Oral Daily  . vitamin C  250 mg Oral Daily   Continuous Infusions: . sodium chloride Stopped (09/18/18 1343)  . meropenem (MERREM) IV Stopped (09/19/18 0947)     LOS: 1 day   Time spent: 35 minutes.  Patrecia Pour, MD [facility] [email or web address] Password [facility] 09/19/2018, 3:39 PM

## 2018-09-20 LAB — BASIC METABOLIC PANEL
Anion gap: 6 (ref 5–15)
BUN: 25 mg/dL — ABNORMAL HIGH (ref 8–23)
CO2: 25 mmol/L (ref 22–32)
Calcium: 8.1 mg/dL — ABNORMAL LOW (ref 8.9–10.3)
Chloride: 112 mmol/L — ABNORMAL HIGH (ref 98–111)
Creatinine, Ser: 1.34 mg/dL — ABNORMAL HIGH (ref 0.61–1.24)
GFR calc Af Amer: 55 mL/min — ABNORMAL LOW (ref >60)
GFR calc non Af Amer: 47 mL/min — ABNORMAL LOW (ref >60)
Glucose, Bld: 96 mg/dL (ref 70–99)
Potassium: 3.7 mmol/L (ref 3.5–5.1)
Sodium: 143 mmol/L (ref 135–145)

## 2018-09-20 LAB — CBC
HEMATOCRIT: 34.2 % — AB (ref 39.0–52.0)
Hemoglobin: 10.2 g/dL — ABNORMAL LOW (ref 13.0–17.0)
MCH: 28.3 pg (ref 26.0–34.0)
MCHC: 29.8 g/dL — ABNORMAL LOW (ref 30.0–36.0)
MCV: 95 fL (ref 80.0–100.0)
Platelets: 240 10*3/uL (ref 150–400)
RBC: 3.6 MIL/uL — ABNORMAL LOW (ref 4.22–5.81)
RDW: 15.9 % — ABNORMAL HIGH (ref 11.5–15.5)
WBC: 9.5 10*3/uL (ref 4.0–10.5)
nRBC: 0 % (ref 0.0–0.2)

## 2018-09-20 MED ORDER — SODIUM CHLORIDE 0.9% FLUSH
10.0000 mL | INTRAVENOUS | Status: DC | PRN
  Administered 2018-09-21: 10 mL
  Filled 2018-09-20: qty 40

## 2018-09-20 NOTE — Progress Notes (Signed)
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 He states that he has felt generally poor & somewhat weak. He was in [place] when sxs ensued with dysuria and malodorous urine. He had UA & culture which was positive ESBL E. Coli. He did not receive abx there. He came back to [place]. He was seen by his urologist Dr. Nevada Crane w/ [name] 09/11/18. He had UA w/ culture. Given 160 mg of IM tobramycin and prescription for macrobid 100 mg BID. Taking as prescribed with improvement of urinary sxs, but no change in general malaise, nausea, and now w/ dry heaving.  Patient states that he developed rash in both lower legs 3 days after taking Macrobid. He developed fever and chills and had temperature 101 at home last night.  Patient denies dysuria, burning on urination, urinary frequency. He states that he has urge to urinate.  No flank pain.  He has nausea, no vomiting, diarrhea or abdominal pain.  He is constipated.  Patient does not have runny nose, sore throat, chest pain, cough, shortness of breath.  No unilateral weakness.  No hematuria or hematochezia.   Assessment & Plan: Principal Problem:   Sepsis due to undetermined organism [facility]) Active Problems:   Atrial fibrillation (HCC)   HTN (hypertension)   HLD (hyperlipidemia)   TIA (transient ischemic attack)   Chronic diastolic heart failure (HCC)   Acute renal failure superimposed on stage 2 chronic kidney disease (HCC)   QT prolongation   Rash   Constipation     Sepsis: Presumably due to untreated/undertreated ESBL E. coli. Macrobid could be expected to fail if patient had upper tract involvement. UA here likely falsely bland. WBC improved.  - Continue meropenem. Discussed with ID, Dr. Baxter Flattery, who recommends 10 days of penem therapy. Ok to change to ertapenem at discharge. Continues to have soft BP despite holding antihypertensives.  - Monitor urine and blood cultures - Hemodynamically stabilizing. Note wide pulse pressures present in outpatient setting as well.  Nocturnal hypoxia:  - Crackles on exam improved. Stopped IVF, restarted home eplerenone. - Recommend sleep study.   Paroxysmal AFib, sinus bradycardia: - Continue telemetry - CHA2DS2-VASc Score is 6, continue eliquis - Continue amiodarone  HTN: - Holding cozaar, norvasc due to hypotension - IV hydralazine as needed  HLD:  - Continue statin  TIA:  - Continue eliquis, statin  Chronic diastolic heart failure: Echo on 06/03/2013 showed EF 65-70%, G1DD. Received IVF's for sepsis now has crackles. BNP 254. - Restarted home eplerenone (formulary equivalent is spiro) and monitor I/O, daily weights.  Acute renal failure superimposed on stage 2 chronic kidney disease ([place]): Baseline Cre is ~1.0, pt's Cre is 1.58 and BUN 31 on admission. Likely due to possible UTI, dehydration and continuation of diuretics and ARB - Follow up renal function by BMP - Hold cozaar   QT prolongation: QTc 568. - Avoid provocative medications as able - Maintain adequate magnesemia.  Rash: Most likely drug eruption (low risk) due to macrobid.  - Stopped macrobid. Improving.  Constipation: - Resolved. Continue bowel  regimen.  DVT prophylaxis: Eliquis Code Status: Full Family Communication: Daughter at bedside Disposition Plan: Home once stabilized. Likely 1/27.  Consultants:   None  Procedures:   None  Antimicrobials:  Meropenem   Subjective: Has been getting up to the bathroom,  feeling weakness is improved but still not at baseline. He was dyspneic last night and had oxygen placed but today feels his breathing is better and he is urinating more spontaneously. Dysuria resolved. Denies fevers.    Objective: Vitals:   09/19/18 1702 09/19/18 2153 09/20/18 0609 09/20/18 1334  BP:  (!) 144/49 (!) 109/57 (!) 131/53  Pulse:  64 (!) 56 (!) 50  Resp:  18 16 17   Temp:  99.2 F (37.3 C) 98.4 F (36.9 C) 98.9 F (37.2 C)  TempSrc:  Oral Oral Oral  SpO2: 94% 95% 96% 94%  Weight:      Height:        Intake/Output Summary (Last 24 hours) at 09/20/2018 1639 Last data filed at 09/20/2018 1600 Gross per 24 hour  Intake 120 ml  Output 0 ml  Net 120 ml   Filed Weights   09/18/18 1134 09/18/18 1845  Weight: 70.3 kg 74.2 kg   Gen: Elderly male in no distress Pulm: Nonlabored breathing room air. Crackles cleared with repeated deep inspiration. CV: Regular bradycardia. No murmur, rub, or gallop. No JVD, no dependent edema. GI: Abdomen soft, non-tender, non-distended, with normoactive bowel sounds.  Ext: Warm, no deformities Skin: Bilateral distal LE's with nonpalpable macular eruption extending to knees/slightly above left knee without blanching, papules, or excoriations. No induration or confluence or fluctuance.  Neuro: Alert and oriented. No focal neurological deficits. Psych: Judgement and insight appear fair. Mood euthymic & affect congruent. Behavior is appropriate.    Data Reviewed: I have personally reviewed following labs and imaging studies  CBC: Recent Labs  Lab 09/18/18 1210 09/19/18 0323 09/20/18 0431  WBC 21.0* 14.9* 9.5  NEUTROABS 19.1*  --   --   HGB 12.0* 11.5* 10.2*  HCT 38.9* 37.4* 34.2*  MCV 89.8 91.7 95.0  PLT 294 300 578   Basic Metabolic Panel: Recent Labs  Lab 09/18/18 1146 09/18/18 2021 09/19/18 0323 09/20/18 0431  NA 139  --  142 143  K 4.0  --  3.9 3.7  CL 107  --  112* 112*  CO2 22  --  22 25  GLUCOSE 161*  --  81 96  BUN  31*  --  28* 25*  CREATININE 1.58*  --  1.36* 1.34*  CALCIUM 9.0  --  8.0* 8.1*  MG  --  2.0  --   --    GFR: Estimated Creatinine Clearance: 35 mL/min (A) (by C-G formula based on SCr of 1.34 mg/dL (H)). Liver Function Tests: Recent Labs  Lab 09/18/18 1146  AST 20  ALT 17  ALKPHOS 59  BILITOT 1.3*  PROT 6.7  ALBUMIN 3.5   No results for input(s): LIPASE, AMYLASE in the last 168 hours. No results for input(s): AMMONIA in the last 168 hours. Coagulation Profile: Recent Labs  Lab 09/18/18 1146  INR 1.79   Cardiac Enzymes: No results for input(s): CKTOTAL, CKMB, CKMBINDEX, TROPONINI in the last 168 hours. BNP (last 3 results) No results for input(s): PROBNP in the last 8760 hours. HbA1C: No results for input(s): HGBA1C in the last 72 hours. CBG: No results for input(s): GLUCAP in the last 168 hours. Lipid Profile: No results for input(s): CHOL, HDL, LDLCALC, TRIG, CHOLHDL, LDLDIRECT in  the last 72 hours. Thyroid Function Tests: No results for input(s): TSH, T4TOTAL, FREET4, T3FREE, THYROIDAB in the last 72 hours. Anemia Panel: No results for input(s): VITAMINB12, FOLATE, FERRITIN, TIBC, IRON, RETICCTPCT in the last 72 hours. Urine analysis:    Component Value Date/Time   COLORURINE YELLOW 09/18/2018 1619   APPEARANCEUR CLOUDY (A) 09/18/2018 1619   LABSPEC <1.005 (L) 09/18/2018 1619   PHURINE 5.5 09/18/2018 1619   GLUCOSEU NEGATIVE 09/18/2018 1619   HGBUR LARGE (A) 09/18/2018 1619   BILIRUBINUR NEGATIVE 09/18/2018 1619   KETONESUR NEGATIVE 09/18/2018 1619   PROTEINUR 30 (A) 09/18/2018 1619   UROBILINOGEN 1.0 06/03/2013 0505   NITRITE NEGATIVE 09/18/2018 1619   LEUKOCYTESUR NEGATIVE 09/18/2018 1619   Recent Results (from the past 240 hour(s))  Culture, blood (Routine x 2)     Status: None (Preliminary result)   Collection Time: 09/18/18 12:00 PM  Result Value Ref Range Status   Specimen Description   Final    RIGHT ANTECUBITAL Performed at [facility], [address] [place], [place] [address]    Special Requests   Final    BOTTLES DRAWN AEROBIC AND ANAEROBIC Blood Culture adequate volume Performed at [facility], [address] [place], [place] [address]    Culture   Final    NO GROWTH 2 DAYS Performed at [facility], [place] [address], [place], [place] [address]    Report Status PENDING  Incomplete  Culture, blood (Routine x 2)     Status: None (Preliminary result)   Collection Time: 09/18/18 12:10 PM  Result Value Ref Range Status   Specimen Description   Final    BLOOD LEFT FOREARM Performed at [facility], [address] [place], [place] [address]    Special Requests   Final    BOTTLES DRAWN AEROBIC AND ANAEROBIC Blood Culture adequate volume Performed at [facility], [address] [place], [place] [address]    Culture   Final    NO GROWTH 2 DAYS Performed at [facility], [place] [address], [place], [place] [address]    Report Status PENDING  Incomplete  Urine culture     Status: None   Collection Time: 09/18/18  4:19 PM  Result Value Ref Range Status   Specimen Description URINE, CLEAN CATCH  Final   Special Requests   Final    NONE Performed at [facility], [address] [place], [place] [address]    Culture NO GROWTH  Final   Report Status 09/19/2018 FINAL  Final  MRSA PCR Screening     Status: None   Collection Time: 09/18/18  6:37 PM  Result Value Ref Range Status   MRSA by PCR NEGATIVE NEGATIVE Final    Comment:        The GeneXpert MRSA Assay (FDA approved for NASAL specimens only), is one component of a comprehensive MRSA colonization surveillance program. It is not intended to diagnose MRSA infection nor to guide or monitor treatment for MRSA infections. Performed at [facility], [place] [address], [place], [place] [address]       Radiology Studies: No results found.  Scheduled Meds: . acidophilus  1  capsule Oral Daily  . amiodarone  200 mg Oral Daily  . apixaban  5 mg Oral BID  . atorvastatin  40 mg Oral Daily  . calcium-vitamin D  1 tablet Oral Q breakfast  .  cholecalciferol  1,000 Units Oral Daily  . levothyroxine  25 mcg Oral QAC breakfast  . loratadine  10 mg Oral Daily  . mouth rinse  15 mL Mouth Rinse BID  . multivitamin-iron-minerals-folic acid  1 tablet Oral Daily  . senna  1 tablet Oral Daily  . spironolactone  25 mg Oral Daily  . vitamin B-12  1,000 mcg Oral Daily  . vitamin C  250 mg Oral Daily   Continuous Infusions: . sodium chloride Stopped (09/18/18 1343)  . meropenem (MERREM) IV Stopped (09/20/18 1017)     LOS: 2 days   Time spent: 25 minutes.  Patrecia Pour, MD [facility] [email or web address] Password [facility] 09/20/2018, 4:39 PM

## 2018-09-20 NOTE — Evaluation (Signed)
Physical Therapy Evaluation Patient Details Name: Stephen Hendrix MRN: [id number] DOB: 1928-09-25 Today's Date: 09/20/2018   History of Present Illness  a 85 y.o. male with medical history significant of atrial fibrillation on eliquis, HTN, TIA, HFpEF, CKD-II, HLD, hypothyroidism, recurrent low-grade noninvasive bladder cancer that was complicated by right ureteral obstruction (status post transurethral resection of the right UO and stent placement), who presents with fever and chill  Clinical Impression  Pt is independent with mobility and is ready to DC home from a PT standpoint. He ambulated 400' without an assistive device, no loss of balance. No further PT indicated, will sign off. Pt is safe to ambulate in halls independently.     Follow Up Recommendations No PT follow up    Equipment Recommendations  None recommended by PT    Recommendations for Other Services       Precautions / Restrictions Precautions Precautions: None Restrictions Weight Bearing Restrictions: No      Mobility  Bed Mobility Overal bed mobility: Independent                Transfers Overall transfer level: Independent                  Ambulation/Gait Ambulation/Gait assistance: Independent Gait Distance (Feet): 400 Feet Assistive device: None Gait Pattern/deviations: WFL(Within Functional Limits) Gait velocity: WNL   General Gait Details: steady, no loss of balance  Stairs            Wheelchair Mobility    Modified Rankin (Stroke Patients Only)       Balance Overall balance assessment: No apparent balance deficits (not formally assessed)                                           Pertinent Vitals/Pain Pain Assessment: No/denies pain    Home Living Family/patient expects to be discharged to:: Private residence Living Arrangements: Spouse/significant other Available Help at Discharge: Family;Available 24 hours/day Type of Home: House Home  Access: Stairs to enter   [employer] of Steps: 3 Home Layout: One level Home Equipment: None Additional Comments: has a home here and one in [place]    Prior Function Level of Independence: Independent               Hand Dominance        Extremity/Trunk Assessment   Upper Extremity Assessment Upper Extremity Assessment: Overall WFL for tasks assessed    Lower Extremity Assessment Lower Extremity Assessment: Overall WFL for tasks assessed    Cervical / Trunk Assessment Cervical / Trunk Assessment: Normal  Communication   Communication: No difficulties  Cognition Arousal/Alertness: Awake/alert Behavior During Therapy: WFL for tasks assessed/performed Overall Cognitive Status: Within Functional Limits for tasks assessed                                        General Comments      Exercises     Assessment/Plan    PT Assessment Patent does not need any further PT services  PT Problem List         PT Treatment Interventions      PT Goals (Current goals can be found in the Care Plan section)  Acute Rehab PT Goals PT Goal Formulation: All assessment and education complete, DC therapy  Frequency     Barriers to discharge        Co-evaluation               AM-PAC PT "6 Clicks" Mobility  Outcome Measure Help needed turning from your back to your side while in a flat bed without using bedrails?: None Help needed moving from lying on your back to sitting on the side of a flat bed without using bedrails?: None Help needed moving to and from a bed to a chair (including a wheelchair)?: None Help needed standing up from a chair using your arms (e.g., wheelchair or bedside chair)?: None Help needed to walk in hospital room?: None Help needed climbing 3-5 steps with a railing? : None 6 Click Score: 24    End of Session Equipment Utilized During Treatment: Gait belt Activity Tolerance: Patient tolerated treatment  well Patient left: in bed;with call bell/phone within reach Nurse Communication: Mobility status      Time: 9024-0973 PT Time Calculation (min) (ACUTE ONLY): 11 min   Charges:   PT Evaluation $PT Eval Low Complexity: 1 Low          Philomena Doheny PT 09/20/2018  Acute Rehabilitation Services Pager [phone] Office [phone]

## 2018-09-20 NOTE — Plan of Care (Signed)
  Problem: Health Behavior/Discharge Planning: Goal: Ability to manage health-related needs will improve Outcome: Progressing   Problem: Clinical Measurements: Goal: Ability to maintain clinical measurements within normal limits will improve Outcome: Progressing Goal: Will remain free from infection Outcome: Progressing Goal: Diagnostic test results will improve Outcome: Progressing Goal: Respiratory complications will improve Outcome: Progressing Goal: Cardiovascular complication will be avoided Outcome: Progressing   Problem: Activity: Goal: Risk for activity intolerance will decrease Outcome: Progressing   Problem: Coping: Goal: Level of anxiety will decrease Outcome: Progressing   Problem: Pain Managment: Goal: General experience of comfort will improve Outcome: Progressing   Problem: Skin Integrity: Goal: Risk for impaired skin integrity will decrease Outcome: Progressing   Problem: Fluid Volume: Goal: Hemodynamic stability will improve Outcome: Progressing   Problem: Clinical Measurements: Goal: Diagnostic test results will improve Outcome: Progressing Goal: Signs and symptoms of infection will decrease Outcome: Progressing   Problem: Respiratory: Goal: Ability to maintain adequate ventilation will improve Outcome: Progressing

## 2018-09-20 NOTE — Progress Notes (Signed)
Assessed patient for Midline placement today per MD order for home health care antibiotics following discharge tomorow 09/21/2018. Both arms were assessed using Ultrasound. Patient does has suitable veins in upper arms for Midline placement. Patient request to wait until prior to discharge tomorrow to have Midline placed. Notified Sammuel Hines, [id number] staff RN

## 2018-09-20 NOTE — Progress Notes (Signed)
Dr. Bonner Puna prefers midline be inserted tonight as to not delay D/C tomorrow morning. Discussed with patient and he is agreeable to have midline placed today. IV team aware.

## 2018-09-21 LAB — BASIC METABOLIC PANEL
Anion gap: 7 (ref 5–15)
BUN: 17 mg/dL (ref 8–23)
CO2: 25 mmol/L (ref 22–32)
Calcium: 8.2 mg/dL — ABNORMAL LOW (ref 8.9–10.3)
Chloride: 108 mmol/L (ref 98–111)
Creatinine, Ser: 0.94 mg/dL (ref 0.61–1.24)
GFR calc Af Amer: >60 mL/min (ref >60)
GFR calc non Af Amer: >60 mL/min (ref >60)
GLUCOSE: 91 mg/dL (ref 70–99)
Potassium: 3.5 mmol/L (ref 3.5–5.1)
Sodium: 140 mmol/L (ref 135–145)

## 2018-09-21 MED ORDER — SODIUM CHLORIDE 0.9 % IV SOLN
1.0000 g | Freq: Three times a day (TID) | INTRAVENOUS | Status: DC
  Administered 2018-09-21 (×2): 1 g via INTRAVENOUS
  Filled 2018-09-21 (×2): qty 1

## 2018-09-21 MED ORDER — ERTAPENEM IV (FOR PTA / DISCHARGE USE ONLY): 1.0000 g | INTRAVENOUS | 0 refills | Status: AC

## 2018-09-21 MED ORDER — SORBITOL 70 % SOLN
30.0000 mL | Freq: Once | Status: DC
  Filled 2018-09-21: qty 30

## 2018-09-21 NOTE — Care Management Important Message (Signed)
Important Message  Patient Details  Name: Stephen Hendrix MRN: [id number] Date of Birth: 21-Oct-1928   Medicare Important Message Given:  Yes    Kerin Salen 09/21/2018, 12:49 [place] Message  Patient Details  Name: Stephen Hendrix MRN: [id number] Date of Birth: 10/07/1928   Medicare Important Message Given:  Yes    Kerin Salen 09/21/2018, 12:49 PM

## 2018-09-21 NOTE — Progress Notes (Signed)
[facility] will provide Home Infusion Pharmacy services and home health nursing services.  [facility] Hospital Infusion Coordinator provided teaching regarding IV ABX in the room with the wife and daughter. [facility] HHRN will provide continued teaching in the home with the first dose tomorrow.   If patient discharges after hours, please call [phone].   Larry Sierras 09/21/2018, 2:09 PM

## 2018-09-21 NOTE — Progress Notes (Signed)
PHARMACY CONSULT NOTE FOR:  OUTPATIENT  PARENTERAL ANTIBIOTIC THERAPY (OPAT)  Indication: ESBL Ecoli UTI Regimen: ertapenem 1gm IV q24h End date: 09/28/2018  IV antibiotic discharge orders are pended. To discharging provider:  please sign these orders via discharge navigator,  Select New Orders & click on the button choice - Manage This Unsigned Work.     Thank you for allowing pharmacy to be a part of this patient's care.  Lynelle Doctor 09/21/2018, 8:59 AM

## 2018-09-21 NOTE — Progress Notes (Signed)
OT Cancellation Note  Patient Details Name: Jubal Rademaker MRN: [id number] DOB: 1928/11/25   Cancelled Treatment:    Reason Eval/Treat Not Completed: OT screened, no needs identified, will sign off; noted per PT pt mobilizing independently and without OT needs at this time. Acute OT to sign off. Thank you for this referral.   Lou Cal, OT Supplemental Rehabilitation Services Pager [phone] Office [phone]   Raymondo Band 09/21/2018, 4:42 PM

## 2018-09-21 NOTE — Progress Notes (Signed)
Discharge instructions reviewed with patient & spouse. Questions concerns denied. No changes from am assessment. Pt remain a&ox4 ambulatory without assistance.

## 2018-09-21 NOTE — Care Management Note (Signed)
Case Management Note  Patient Details  Name: Stephen Hendrix MRN: [id number] Date of Birth: May 15, 1929  Subjective/Objective:  From home w/spouse.Spouse chose-[facility]- [facility]-iv abx,infusion supplies-rep Pam-iv infusion;[facility] rep Karen-iv abx instruction. Provided w/pcp listing-spuse chose [facility]-Dr. Domingo Mend see d/c f/u section. Informed spouse to contact the insurance company to ensure if pcp in network-spouse voiced understanding. Address: [address] [place] [address];spouse-Maru c# [phone]. No further CM needs.                   Action/Plan:dc home w/HHC.   Expected Discharge Date:  09/21/18               Expected Discharge Plan:  [facility]  In-House Referral:     Discharge planning Services  CM Consult  Post Acute Care Choice:    Choice offered to:  Patient  DME Arranged:    DME Agency:     HH Arranged:  RN, IV Antibiotics HH Agency:  [facility]  Status of Service:  Completed, signed off  If discussed at [place] of Stay Meetings, dates discussed:    Additional Comments:  Dessa Phi, RN 09/21/2018, 12:55 PM

## 2018-09-21 NOTE — Progress Notes (Signed)
Pharmacy Antibiotic Note  Stephen Hendrix is a 85 y.o. male with hx recurrent noninvasive bladder cancer, right ureteral obstruction (s/p transurethral resection of the right UO and stent placement), and recent ESBL Ecoli UTI.  He was treated with tobramycin IM x1 dose then macrobid 100 mg bid PTA.  He presented to the ED on 09/18/18 with fever.  Patient was started on meropenem on admission. ID recommends to treat with abx for 10 days.  Today, 09/21/2018: - day #3 abx - afeb, wbc wnl - scr down 0.94 (crcl~50) - plan is to discharge patient on Ertapenem with abx end date 09/28/18   Plan: - adjust meropenem to 1gm IV q8h d/t improving renal function - transition patient to ertapenem 1gm IV q24h at discharge  _________________________________________  Height: 5\' 6"  (167.6 cm) Weight: 163 lb 9.3 oz (74.2 kg) IBW/kg (Calculated) : 63.8  Temp (24hrs), Avg:98.6 F (37 C), Min:98.1 F (36.7 C), Max:98.9 F (37.2 C)  Recent Labs  Lab 09/18/18 1146 09/18/18 1201 09/18/18 1210 09/18/18 2021 09/19/18 0323 09/20/18 0431 09/21/18 0323  WBC  --   --  21.0*  --  14.9* 9.5  --   CREATININE 1.58*  --   --   --  1.36* 1.34* 0.94  LATICACIDVEN  --  1.67  --  1.6  --   --   --     Estimated Creatinine Clearance: 50 mL/min (by C-G formula based on SCr of 0.94 mg/dL).    Allergies  Allergen Reactions  . Corticosteroids Shortness Of Breath    HICCUPS AND RESPIRATORY DISTRESS  . Methylprednisolone Shortness Of Breath    Pt tolerates po steroids  . Other Anaphylaxis    IV steroid  . Penicillins Rash    Did it involve swelling of the face/tongue/throat, SOB, or low BP? Skin Rash on Chest Did it involve sudden or severe rash/hives, skin peeling, or any reaction on the inside of your mouth or nose? No Did you need to seek medical attention at a hospital or doctor's office? No. When did it last happen?Decades ago. If all above answers are "NO", may proceed with cephalosporin use.    Marland Kitchen Avocado     Other reaction(s): Other (See Comments) ORAL PEELING  . Macrobid [Nitrofurantoin]     Rash   . Cortisone     Hiccups for a week Hiccups for a week Hiccups for a week     Thank you for allowing pharmacy to be a part of this patient's care.  Lynelle Doctor 09/21/2018 9:38 AM

## 2018-09-21 NOTE — Discharge Summary (Signed)
Physician Discharge Summary  Stephen Hendrix CZY:[id number] DOB: March 13, 1929 DOA: 09/18/2018  PCP: Martinique, Betty G, MD  Admit date: 09/18/2018 Discharge date: 09/21/2018  Admitted From: Home Disposition: Home   Recommendations for Outpatient Follow-up:  1. Follow up with PCP in 1-2 weeks 2. Please obtain BMP/CBC in one week  Home Health: RN Equipment/Devices: Right single lumen midline PIV Discharge Condition: Stable CODE STATUS: Full Diet recommendation: Heart healthy  Brief/Interim Summary: Stephen Hendrix a 85 y.o.malewith medical history significant ofatrial fibrillation on eliquis, HTN, TIA, HFpEF,CKD-II, HLD,hypothyroidism,recurrent low-grade noninvasive bladder cancer that was complicated by right ureteral obstruction(status post transurethral resection of the right UO and stent placement),who presentswith fever and chill  Pt states that he has not been feeling well forseveral weeks.He states that he has felt generally poor & somewhat weak. He was in [place] when sxs ensued with dysuria and malodorous urine. He hadUA & culturewhich was positiveESBL E. Coli. Hedid not receive abxthere. [place] came [place]. He wasseen byhisurologist Dr. Nevada Crane w/ [name] 09/11/18. He had UA w/ culture. Given 160 mg of IM tobramycin and prescription for macrobid 100 mg BID. Taking as prescribed with improvement of urinary sxs, but no change in general malaise, nausea, and now w/ dry heaving.Patient states that he developed rash inboth lower legs 3 days after taking Macrobid. Hedeveloped fever and chillsand hadtemperature 101 at home last night. Patient denies dysuria, burning on urination, urinary frequency. Hestates that he has urge tourinate. No flank pain. He has nausea, no vomiting, diarrhea or abdominal pain. He is constipated. Patient does not have runny nose, sore throat, chest pain, cough, shortness of breath. No unilateral weakness. No hematuria or  hematochezia.   Discharge Diagnoses:  Principal Problem:   Sepsis due to undetermined organism [facility]) Active Problems:   Atrial fibrillation (HCC)   HTN (hypertension)   HLD (hyperlipidemia)   TIA (transient ischemic attack)   Chronic diastolic heart failure (HCC)   Acute renal failure superimposed on stage 2 chronic kidney disease (HCC)   QT prolongation   Rash   Constipation  Sepsis: Presumably due to untreated/undertreated ESBL E. coli. Macrobid could be expected to fail if patient had upper tract involvement. UA here likely falsely bland. WBC improved.  - Continue meropenem. Discussed with ID, Dr. Baxter Flattery, who recommends 10 days of penem therapy. Ok to change to ertapenem at discharge. This has been arranged to be administered through midline IV inserted 1/26.   Nocturnal hypoxia: Improved. - Recommend sleep study.   Paroxysmal AFib, sinus bradycardia: - CHA2DS2-VASc Scoreis 6, continue eliquis - Continueamiodarone  HTN: - Initially held cozaar, norvasc due to hypotension. Improved, ok to restart. - IV hydralazine as needed  HLD:  - Continue statin  TIA:  - Continue eliquis, statin  Chronic diastolic heart failure: Echo on 06/03/2013 showed EF 65-70%, G1DD. Received IVF's for sepsis now has crackles. BNP 254. - Restarted home eplerenone (formulary equivalent is spiro) and monitor I/O, daily weights.  Acute renal failure superimposed on stage 2 chronic kidney disease (HCC):Baseline Cre is~1.0, pt's Cre is1.58 and BUN 31on admission. Likely due to possible UTI, dehydration andcontinuation of diuretics and ARB - Follow up renal function by BMP - Holdcozaar   QT prolongation: QTc 568. - Avoid provocative medications as able - Maintain adequate magnesemia.  Rash:Most likely drug eruption (low risk) due to macrobid.  - Stopped macrobid. Improving.  Constipation: - Resolved. Continue bowel regimen.  Discharge Instructions Discharge Instructions     Diet - low sodium  heart healthy   Complete by:  As directed    Discharge instructions   Complete by:  As directed    You were evaluated for a UTI due to ESBL E. coli. This has improved with medications, though options are limited for antibiotics. This means you require IV antibiotic (ERTAPENEM) at home to complete the course of treatment. This will be arranged by care management.  - If you experience worsening of symptoms, seek medical attention right away.  - Follow up with your PCP, who is listed as Dr. Martinique, or call to establish care with a different doctor as soon as possible. If you are unable to get an appointment without 1-2 weeks, call to get a follow up appointment with Dr. Nevada Crane.   Home infusion instructions [facility] May follow [place] Dosing Protocol; May administer Cathflo as needed to maintain patency of vascular access device.; Flushing of vascular access device: per [facility] Protocol: 0.9% NaCl pre/post medica...   Complete by:  As directed    Instructions:  May follow [place] Dosing Protocol   Instructions:  May administer Cathflo as needed to maintain patency of vascular access device.   Instructions:  Flushing of vascular access device: per [facility] Protocol: 0.9% NaCl pre/post medication administration and prn patency; Heparin 100 u/ml, 53ml for implanted ports and Heparin 10u/ml, 71ml for all other central venous catheters.   Instructions:  May follow [facility] Anaphylaxis Protocol for First Dose Administration in the home: 0.9% NaCl at 25-50 ml/hr to maintain IV access for protocol meds. Epinephrine 0.3 ml IV/IM PRN and Benadryl 25-50 IV/IM PRN s/s of anaphylaxis.   Instructions:  [facility] Infusion Coordinator (RN) to assist per patient IV care needs in the home PRN.   Increase activity slowly   Complete by:  As directed      Allergies as of 09/21/2018      Reactions   Corticosteroids Shortness Of Breath   HICCUPS AND RESPIRATORY DISTRESS   Methylprednisolone  Shortness Of Breath   Pt tolerates po steroids   Other Anaphylaxis   IV steroid   Penicillins Rash   Did it involve swelling of the face/tongue/throat, SOB, or low BP? Skin Rash on Chest Did it involve sudden or severe rash/hives, skin peeling, or any reaction on the inside of your mouth or nose? No Did you need to seek medical attention at a hospital or doctor's office? No. When did it last happen?Decades ago. If all above answers are "NO", may proceed with cephalosporin use.   Avocado    Other reaction(s): Other (See Comments) ORAL PEELING   Macrobid [nitrofurantoin]    Rash   Cortisone    Hiccups for a week Hiccups for a week Hiccups for a week      Medication List    STOP taking these medications   nitrofurantoin 100 MG capsule Commonly known as:  MACRODANTIN     TAKE these medications   acetaminophen 500 MG tablet Commonly known as:  TYLENOL Take 1,000 mg by mouth every 6 (six) hours as needed for fever.   ALIGN PO Take 1 capsule by mouth daily.   amiodarone 200 MG tablet Commonly known as:  PACERONE Take 1 tablet (200 mg total) by mouth daily.   amLODipine 5 MG tablet Commonly known as:  NORVASC Please call to schedule yearly appointment for further refills [phone]. What changed:    how much to take  how to take this  when to take this   apixaban  5 MG Tabs tablet Commonly known as:  ELIQUIS Take 1 tablet (5 mg total) by mouth 2 (two) times daily.   atorvastatin 40 MG tablet Commonly known as:  LIPITOR Take 1 tablet (40 mg total) by mouth daily.   CALCIUM PO Take 500 mg by mouth daily.   cetirizine 10 MG tablet Commonly known as:  ZYRTEC Take 10 mg by mouth every evening.   cholecalciferol 25 MCG (1000 UT) tablet Commonly known as:  VITAMIN D3 Take 1,000 Units by mouth daily.   eplerenone 25 MG tablet Commonly known as:  INSPRA Please call to schedule early appointment for further refills [phone] What changed:    how much  to take  how to take this  when to take this   ertapenem  IVPB Commonly known as:  INVANZ Inject 1 g into the vein daily for 7 days. Indication:  ESBL Ecoli UTI Last Day of Therapy:  09/28/2018 Labs - Once weekly:  CBC/D and BMP,   levothyroxine 25 MCG tablet Commonly known as:  SYNTHROID, LEVOTHROID Take 1 tablet (25 mcg total) by mouth daily before breakfast.   losartan 50 MG tablet Commonly known as:  COZAAR Take 1 tablet (50 mg total) by mouth daily.   MULTIVITAMIN GUMMIES ADULT Chew Chew 1 tablet by mouth daily.   vitamin B-12 1000 MCG tablet Commonly known as:  CYANOCOBALAMIN Take 1,000 mcg by mouth daily.   Vitamin C 250 MG Chew Chew 1 tablet by mouth daily.            Home Infusion Instuctions  (From admission, onward)         Start     Ordered   09/21/18 0000  Home infusion instructions [facility] May follow [place] Dosing Protocol; May administer Cathflo as needed to maintain patency of vascular access device.; Flushing of vascular access device: per [facility] Protocol: 0.9% NaCl pre/post medica...    Question Answer Comment  Instructions May follow [place] Dosing Protocol   Instructions May administer Cathflo as needed to maintain patency of vascular access device.   Instructions Flushing of vascular access device: per [facility] Protocol: 0.9% NaCl pre/post medication administration and prn patency; Heparin 100 u/ml, 36ml for implanted ports and Heparin 10u/ml, 74ml for all other central venous catheters.   Instructions May follow [facility] Anaphylaxis Protocol for First Dose Administration in the home: 0.9% NaCl at 25-50 ml/hr to maintain IV access for protocol meds. Epinephrine 0.3 ml IV/IM PRN and Benadryl 25-50 IV/IM PRN s/s of anaphylaxis.   Instructions [facility] Infusion Coordinator (RN) to assist per patient IV care needs in the home PRN.      09/21/18 0942         Follow-up Information    Martinique, Betty G, MD. Schedule an appointment as  soon as possible for a visit in 1 week(s).   Specialty:  Family Medicine Contact information: [place] [address] [phone]          Allergies  Allergen Reactions  . Corticosteroids Shortness Of Breath    HICCUPS AND RESPIRATORY DISTRESS  . Methylprednisolone Shortness Of Breath    Pt tolerates po steroids  . Other Anaphylaxis    IV steroid  . Penicillins Rash    Did it involve swelling of the face/tongue/throat, SOB, or low BP? Skin Rash on Chest Did it involve sudden or severe rash/hives, skin peeling, or any reaction on the inside of your mouth or nose? No  Did you need to seek medical attention at a hospital or doctor's office? No. When did it last happen?Decades ago. If all above answers are "NO", may proceed with cephalosporin use.   Marland Kitchen Avocado     Other reaction(s): Other (See Comments) ORAL PEELING  . Macrobid [Nitrofurantoin]     Rash   . Cortisone     Hiccups for a week Hiccups for a week Hiccups for a week    Consultations:  ID  Procedures/Studies: Dg Chest 2 View  Result Date: 09/18/2018 CLINICAL DATA:  Fever and bilateral lower extremity swelling for a few days. EXAM: CHEST - 2 VIEW COMPARISON:  PA and lateral chest 06/03/2013 and 10/21/2007. FINDINGS: Lungs clear. Heart size upper normal. No pneumothorax or pleural effusion. No acute or focal bony abnormality. IMPRESSION: Negative chest. Electronically Signed   By: Inge Rise M.D.   On: 09/18/2018 11:52   Ct Abdomen Pelvis W Contrast  Result Date: 09/18/2018 CLINICAL DATA:  Constipation. EXAM: CT ABDOMEN AND PELVIS WITH CONTRAST TECHNIQUE: Multidetector CT imaging of the abdomen and pelvis was performed using the standard protocol following bolus administration of intravenous contrast. CONTRAST:  68mL ISOVUE-300 IOPAMIDOL (ISOVUE-300) INJECTION 61% COMPARISON:  None. FINDINGS: Lower chest: Mild dependent atelectasis of posterior lung bases are noted. Minimal  bilateral posterior pleural effusions are noted. The heart size is enlarged. Hepatobiliary: The liver is normal without focal liver lesion. The gallbladder is not seen. The biliary tree is normal. Pancreas: Unremarkable. No pancreatic ductal dilatation or surrounding inflammatory changes. Spleen: Normal in size without focal abnormality. Adrenals/Urinary Tract: The bilateral adrenal glands are normal. There is a 1.9 cm lesion at lower pole right kidney higher density than would be suspected for a simple cyst. Upper pole right kidney simple cyst is noted. The bladder is normal. Stomach/Bowel: Stomach is within normal limits. Appendix is not seen but no inflammation is noted around cecum. No evidence of bowel wall thickening, distention, or inflammatory changes. Vascular/Lymphatic: Aortic atherosclerosis. No enlarged abdominal or pelvic lymph nodes. Reproductive: Prostate calcifications are noted. Other: None. Musculoskeletal: Degenerative joint changes of the spine are noted. IMPRESSION: No bowel obstruction. Extensive bowel content identified throughout colon consistent with constipation. 1.9 cm lesion in the lower pole right kidney, higher density than would be expected for simple cysts. Recommend further evaluation with renal ultrasound on outpatient basis. Electronically Signed   By: Abelardo Diesel M.D.   On: 09/18/2018 13:27      Subjective: Feels well, walking the halls. No fevers, dysuria, weakness. Wants to go.   Discharge Exam: Vitals:   09/21/18 0500 09/21/18 0936  BP: (!) 134/53 132/73  Pulse: (!) 54 (!) 52  Resp: 18   Temp: 98.8 F (37.1 C)   SpO2: 94%    General: Pt is alert, awake, not in acute distress Cardiovascular: RRR, S1/S2 +, no rubs, no gallops Respiratory: CTA bilaterally, no wheezing, no rhonchi Abdominal: Soft, NT, ND, bowel sounds + Extremities: No edema, no cyanosis  Labs: BNP (last 3 results) Recent Labs    09/18/18 2021  BNP 338.2*   Basic Metabolic  Panel: Recent Labs  Lab 09/18/18 1146 09/18/18 2021 09/19/18 0323 09/20/18 0431 09/21/18 0323  NA 139  --  142 143 140  K 4.0  --  3.9 3.7 3.5  CL 107  --  112* 112* 108  CO2 22  --  22 25 25   GLUCOSE 161*  --  81 96 91  BUN 31*  --  28* 25* 17  CREATININE 1.58*  --  1.36* 1.34* 0.94  CALCIUM 9.0  --  8.0* 8.1* 8.2*  MG  --  2.0  --   --   --    Liver Function Tests: Recent Labs  Lab 09/18/18 1146  AST 20  ALT 17  ALKPHOS 59  BILITOT 1.3*  PROT 6.7  ALBUMIN 3.5   No results for input(s): LIPASE, AMYLASE in the last 168 hours. No results for input(s): AMMONIA in the last 168 hours. CBC: Recent Labs  Lab 09/18/18 1210 09/19/18 0323 09/20/18 0431  WBC 21.0* 14.9* 9.5  NEUTROABS 19.1*  --   --   HGB 12.0* 11.5* 10.2*  HCT 38.9* 37.4* 34.2*  MCV 89.8 91.7 95.0  PLT 294 300 240   Cardiac Enzymes: No results for input(s): CKTOTAL, CKMB, CKMBINDEX, TROPONINI in the last 168 hours. BNP: Invalid input(s): POCBNP CBG: No results for input(s): GLUCAP in the last 168 hours. D-Dimer No results for input(s): DDIMER in the last 72 hours. Hgb A1c No results for input(s): HGBA1C in the last 72 hours. Lipid Profile No results for input(s): CHOL, HDL, LDLCALC, TRIG, CHOLHDL, LDLDIRECT in the last 72 hours. Thyroid function studies No results for input(s): TSH, T4TOTAL, T3FREE, THYROIDAB in the last 72 hours.  Invalid input(s): FREET3 Anemia work up No results for input(s): VITAMINB12, FOLATE, FERRITIN, TIBC, IRON, RETICCTPCT in the last 72 hours. Urinalysis    Component Value Date/Time   COLORURINE YELLOW 09/18/2018 1619   APPEARANCEUR CLOUDY (A) 09/18/2018 1619   LABSPEC <1.005 (L) 09/18/2018 1619   PHURINE 5.5 09/18/2018 1619   GLUCOSEU NEGATIVE 09/18/2018 1619   HGBUR LARGE (A) 09/18/2018 1619   BILIRUBINUR NEGATIVE 09/18/2018 1619   KETONESUR NEGATIVE 09/18/2018 1619   PROTEINUR 30 (A) 09/18/2018 1619   UROBILINOGEN 1.0 06/03/2013 0505   NITRITE NEGATIVE  09/18/2018 1619   LEUKOCYTESUR NEGATIVE 09/18/2018 1619    Microbiology Recent Results (from the past 240 hour(s))  Culture, blood (Routine x 2)     Status: None (Preliminary result)   Collection Time: 09/18/18 12:00 PM  Result Value Ref Range Status   Specimen Description   Final    RIGHT ANTECUBITAL Performed at [facility], [address] [place], [place] [address]    Special Requests   Final    BOTTLES DRAWN AEROBIC AND ANAEROBIC Blood Culture adequate volume Performed at [facility], [address] [place], [place] [address]    Culture   Final    NO GROWTH 2 DAYS Performed at [facility], [address] [place], [place] [address]    Report Status PENDING  Incomplete  Culture, blood (Routine x 2)     Status: None (Preliminary result)   Collection Time: 09/18/18 12:10 PM  Result Value Ref Range Status   Specimen Description   Final    BLOOD LEFT FOREARM Performed at [facility], [address] [place], [place] [address]    Special Requests   Final    BOTTLES DRAWN AEROBIC AND ANAEROBIC Blood Culture adequate volume Performed at [facility], [address] [place], [place] [address]    Culture   Final    NO GROWTH 2 DAYS Performed at [facility], [place] [address], [place], [place] [address]    Report Status PENDING  Incomplete  Urine culture     Status: None   Collection Time: 09/18/18  4:19 PM  Result Value Ref  Range Status   Specimen Description URINE, CLEAN CATCH  Final   Special Requests   Final    NONE Performed at [facility], [address] [place], [place] [address]    Culture NO GROWTH  Final   Report Status 09/19/2018 FINAL  Final  MRSA PCR Screening     Status: None   Collection Time: 09/18/18  6:37 PM  Result Value Ref Range Status   MRSA by PCR NEGATIVE NEGATIVE Final    Comment:        The GeneXpert MRSA Assay (FDA approved for NASAL specimens only), is one component of  a comprehensive MRSA colonization surveillance program. It is not intended to diagnose MRSA infection nor to guide or monitor treatment for MRSA infections. Performed at [facility], [place] [address], [place], [place] [address]     Time coordinating discharge: Approximately 40 minutes  Patrecia Pour, MD  [facility] 09/21/2018, 9:42 AM Pager [phone]

## 2018-09-21 NOTE — Care Management Note (Signed)
Case Management Note  Patient Details  Name: Rigby Leonhardt MRN: [id number] Date of Birth: 1928/11/04  Subjective/Objective: Sepsis. From home. No CM needs or orders.                   Action/Plan:dc home.   Expected Discharge Date:  09/21/18               Expected Discharge Plan:  Home/Self Care  In-House Referral:     Discharge planning Services  CM Consult  Post Acute Care Choice:    Choice offered to:     DME Arranged:    DME Agency:     HH Arranged:    HH Agency:     Status of Service:  Completed, signed off  If discussed at [employer] of Stay Meetings, dates discussed:    Additional Comments:  Dessa Phi, RN 09/21/2018, 10:16 AM

## 2018-09-23 LAB — CULTURE, BLOOD (ROUTINE X 2)
Culture: NO GROWTH
Culture: NO GROWTH
SPECIAL REQUESTS: ADEQUATE
Special Requests: ADEQUATE

## 2018-09-29 ENCOUNTER — Encounter: Payer: Self-pay | Admitting: Physician Assistant

## 2018-09-29 ENCOUNTER — Ambulatory Visit: Payer: Medicare PPO | Admitting: Internal Medicine

## 2018-09-30 ENCOUNTER — Ambulatory Visit: Payer: Medicare PPO | Admitting: Internal Medicine

## 2018-09-30 ENCOUNTER — Ambulatory Visit: Payer: Medicare PPO | Admitting: Physician Assistant

## 2018-09-30 ENCOUNTER — Encounter: Payer: Self-pay | Admitting: Physician Assistant

## 2018-09-30 ENCOUNTER — Encounter: Payer: Self-pay | Admitting: Internal Medicine

## 2018-09-30 VITALS — BP 130/60 | HR 67 | Temp 97.8°F | Ht 66.0 in | Wt 159.6 lb

## 2018-09-30 VITALS — BP 134/58 | HR 70 | Ht 66.0 in | Wt 159.8 lb

## 2018-09-30 DIAGNOSIS — N182 Chronic kidney disease, stage 2 (mild): Secondary | ICD-10-CM | POA: Insufficient documentation

## 2018-09-30 DIAGNOSIS — G459 Transient cerebral ischemic attack, unspecified: Secondary | ICD-10-CM | POA: Diagnosis not present

## 2018-09-30 DIAGNOSIS — I10 Essential (primary) hypertension: Secondary | ICD-10-CM

## 2018-09-30 DIAGNOSIS — E785 Hyperlipidemia, unspecified: Secondary | ICD-10-CM

## 2018-09-30 DIAGNOSIS — I5032 Chronic diastolic (congestive) heart failure: Secondary | ICD-10-CM

## 2018-09-30 DIAGNOSIS — I480 Paroxysmal atrial fibrillation: Secondary | ICD-10-CM

## 2018-09-30 DIAGNOSIS — Z09 Encounter for follow-up examination after completed treatment for conditions other than malignant neoplasm: Secondary | ICD-10-CM

## 2018-09-30 NOTE — Patient Instructions (Signed)
-  It was nice meeting you today!  -Will let your [facility] agency know that we can discontinue the PICC line.  -Schedule return visit in 6 months.

## 2018-09-30 NOTE — Progress Notes (Signed)
New Patient Office Visit     CC/Reason for Visit: Establish care, hospital follow-up Previous PCP: None Last Visit: None  HPI: Stephen Hendrix is a 85 y.o. male who is coming in today for the above mentioned reasons.  He lives between [place] and here.  In January he had urinary issues and went to see a physician in [place] and was diagnosed with an ESBL E. coli UTI.  Patient states that as soon as he got that report he got a plane ticket to come here the next day.  He saw his urologist who we did culture and gave him some antibiotics with a planned course for a week.  However he worsened and came to the hospital.  Subsequently it was found that the initial culture had grown ESBL E. coli which explains why he did not improve with regular antibiotics.  He was admitted for 4 days and was discharged on ertapenem for 7 days via PICC line [facility].  [facility] days [facility] performed blood work and his WBC count was 11.4, was 9.5 on discharge.  They wanted know if it is okay to remove the PICC line.  He has stage III chronic kidney disease and was noted to have acute renal failure, presumably due to sepsis phenomena, creatinine has improved since discharge.  Past Medical History is significant for: Atrial fibrillation anticoagulated on Eliquis, he is on amiodarone.  He sees cardiology in fact just saw him today.  He also has a history of hyperlipidemia on a statin, benign essential hypertension and a prior history of TIA.  He also has a history of chronic diastolic heart failure with most recent echocardiogram in April 2018 that showed normal left ventricular ejection fraction.  He did have some lower extremity edema in the hospital likely due to sepsis and fluid resuscitation, this has improved.   Past Medical/Surgical History: Past Medical History:  Diagnosis Date  . Atrial fibrillation ([facility])   . Cancer ([place])   . Hypertension   . Pneumonia   .  TIA (transient ischemic attack)   . Whooping cough     Past Surgical History:  Procedure Laterality Date  . EYE SURGERY    . TONSILLECTOMY      Social History:  reports that he has never smoked. He has never used smokeless tobacco. He reports current alcohol use of about 2.0 standard drinks of alcohol per week. He reports that he does not use drugs.  Allergies: Allergies  Allergen Reactions  . Corticosteroids Shortness Of Breath    HICCUPS AND RESPIRATORY DISTRESS  . Methylprednisolone Shortness Of Breath    Pt tolerates po steroids  . Other Anaphylaxis    IV steroid  . Penicillins Rash    Did it involve swelling of the face/tongue/throat, SOB, or low BP? Skin Rash on Chest Did it involve sudden or severe rash/hives, skin peeling, or any reaction on the inside of your mouth or nose? No Did you need to seek medical attention at a hospital or doctor's office? No. When did it last happen?Decades ago. If all above answers are "NO", may proceed with cephalosporin use.   Marland Kitchen Avocado     Other reaction(s): Other (See Comments) ORAL PEELING  . Macrobid [Nitrofurantoin]     Rash   . Cortisone     Hiccups for a week Hiccups for a week Hiccups for a week    Family History:  Family History  Problem  Relation Age of Onset  . CAD Father   . Colon cancer Other      Current Outpatient Medications:  .  acetaminophen (TYLENOL) 500 MG tablet, Take 1,000 mg by mouth every 6 (six) hours as needed for fever., Disp: , Rfl:  .  amiodarone (PACERONE) 200 MG tablet, Take 1 tablet (200 mg total) by mouth daily., Disp: 90 tablet, Rfl: 2 .  amLODipine (NORVASC) 5 MG tablet, Please call to schedule yearly appointment for further refills [phone]. (Patient taking differently: Take 5 mg by mouth daily. Please call to schedule yearly appointment for further refills [phone].), Disp: 30 tablet, Rfl: 0 .  apixaban (ELIQUIS) 5 MG TABS tablet, Take 1 tablet (5 mg total) by mouth 2 (two)  times daily., Disp: 180 tablet, Rfl: 2 .  Ascorbic Acid (VITAMIN C) 250 MG CHEW, Chew 1 tablet by mouth daily., Disp: , Rfl:  .  atorvastatin (LIPITOR) 40 MG tablet, Take 1 tablet (40 mg total) by mouth daily., Disp: 90 tablet, Rfl: 2 .  CALCIUM PO, Take 500 mg by mouth daily., Disp: , Rfl:  .  cetirizine (ZYRTEC) 10 MG tablet, Take 10 mg by mouth every evening., Disp: , Rfl:  .  cholecalciferol (VITAMIN D3) 25 MCG (1000 UT) tablet, Take 1,000 Units by mouth daily., Disp: , Rfl:  .  eplerenone (INSPRA) 25 MG tablet, Please call to schedule early appointment for further refills [phone] (Patient taking differently: Take 25 mg by mouth daily. Please call to schedule early appointment for further refills [phone]), Disp: 30 tablet, Rfl: 0 .  levothyroxine (SYNTHROID, LEVOTHROID) 25 MCG tablet, Take 1 tablet (25 mcg total) by mouth daily before breakfast., Disp: 90 tablet, Rfl: 3 .  losartan (COZAAR) 50 MG tablet, Take 1 tablet (50 mg total) by mouth daily., Disp: 90 tablet, Rfl: 2 .  Multiple Vitamins-Minerals (MULTIVITAMIN GUMMIES ADULT) CHEW, Chew 1 tablet by mouth daily., Disp: , Rfl:  .  Probiotic Product (ALIGN PO), Take 1 capsule by mouth daily., Disp: , Rfl:  .  vitamin B-12 (CYANOCOBALAMIN) 1000 MCG tablet, Take 1,000 mcg by mouth daily., Disp: , Rfl:   Review of Systems:  Constitutional: Denies fever, chills, diaphoresis, appetite change and fatigue.  HEENT: Denies photophobia, eye pain, redness, hearing loss, ear pain, congestion, sore throat, rhinorrhea, sneezing, mouth sores, trouble swallowing, neck pain, neck stiffness and tinnitus.   Respiratory: Denies SOB, DOE, cough, chest tightness,  and wheezing.   Cardiovascular: Denies chest pain, palpitations and leg swelling.  Gastrointestinal: Denies nausea, vomiting, abdominal pain, diarrhea, constipation, blood in stool and abdominal distention.  Genitourinary: Denies dysuria, urgency, frequency, hematuria, flank pain and difficulty  urinating.  Endocrine: Denies: hot or cold intolerance, sweats, changes in hair or nails, polyuria, polydipsia. Musculoskeletal: Denies myalgias, back pain, joint swelling, arthralgias and gait problem.  Skin: Denies pallor, rash and wound.  Neurological: Denies dizziness, seizures, syncope, weakness, light-headedness, numbness and headaches.  Hematological: Denies adenopathy. Easy bruising, personal or family bleeding history  Psychiatric/Behavioral: Denies suicidal ideation, mood changes, confusion, nervousness, sleep disturbance and agitation    Physical Exam: Vitals:   09/30/18 1444  BP: 130/60  Pulse: 67  Temp: 97.8 F (36.6 C)  TempSrc: Oral  SpO2: 96%  Weight: 159 lb 9.6 oz (72.4 kg)  Height: 5\' 6"  (1.676 m)   Body mass index is 25.76 kg/m.  Constitutional: NAD, calm, comfortable Eyes: PERRL, lids and conjunctivae normal ENMT: Mucous membranes are moist.  Respiratory: clear to auscultation bilaterally, no wheezing, no crackles. Normal  respiratory effort. No accessory muscle use.  Cardiovascular: Regular rate and rhythm, no murmurs / rubs / gallops. No extremity edema. 2+ pedal pulses. No carotid bruits.  Abdomen: no tenderness, no masses palpated. No hepatosplenomegaly. Bowel sounds positive.  Musculoskeletal: no clubbing / cyanosis. No joint deformity upper and lower extremities. Good ROM, no contractures. Normal muscle tone.  Psychiatric: Normal judgment and insight. Alert and oriented x 3. Normal mood.    Impression and Plan:  Hospital discharge follow-up -Hospitalized for sepsis due to E. coli ESBL UTI. -He has completed a 7-day course of ertapenem as planned by discharging hospitalist. -His labs post completion of antibiotics show a slight increase in WBCs from 9.5-11.4, patient has no symptoms of ongoing UTI. -I believe it is okay to discontinue PICC line at this point.  Paroxysmal atrial fibrillation ([facility]) -Followed by cardiology, rate controlled and  anticoagulated.  On Eliquis and amiodarone.  Essential hypertension -Well-controlled.  CKD (chronic kidney disease), stage II -Creatinine is back down to his baseline of 0.94, he did have some acute renal failure while hospitalized in the context of sepsis and UTI.  Hyperlipidemia, unspecified hyperlipidemia type -Continue statin.     Patient Instructions  -It was nice meeting you today!  -Will let your [facility] agency know that we can discontinue the PICC line.  -Schedule return visit in 6 months.      Lelon Frohlich, MD [facility]

## 2018-09-30 NOTE — Progress Notes (Signed)
Cardiology Office Note    Date:  09/30/2018   ID:  Stephen Hendrix, DOB [birth year], MRN [id number]  PCP:  Stephen Hendrix, Stephen Halsted, MD  Cardiologist: No primary care provider on file. EPS: None  Chief Complaint  Patient presents with  . Hospitalization Follow-up    History of Present Illness:  Stephen Hendrix is a 85 y.o. male with history of PAF on amiodarone, CHA2DS2-VASc equals 6 on Eliquis, hypertension, HLD, prior TIA.  Patient lives in [place] 6 months of the year.  Patient last saw Dr. Irish Hendrix 04/15/2018 which time he says his heart rate was in the 40s when he woke up but he was asymptomatic.  No changes made.  Patient was discharged from the hospital 09/21/2018 after admission with sepsis presumably due to untreated  ESBL E. coli.  Creatinine bumped up to 1.58 felt secondary to UTI and dehydration.  Received IV fluids and BNP went up to 254.  2D echo 11/2017 EF 65%, mild AI mild diastolic dysfunction.  Patient comes in today accompanied by his wife and daughter.  He says he got sick when he was in [place] and booked a flight the next day and ended up in the hospital.  Overall he is feeling better.  He had blood work done 09/28/2017 and creatinine was 1.34 and WBC was 11,000.  He just finished his antibiotics Monday.  Had carotid Dopplers in [place] last summer and had TSH checked then as well.  Past Medical History:  Diagnosis Date  . Atrial fibrillation ([place])   . Cancer ([place])   . Hypertension   . Pneumonia   . TIA (transient ischemic attack)   . Whooping cough     Past Surgical History:  Procedure Laterality Date  . EYE SURGERY    . TONSILLECTOMY      Current Medications: Current Meds  Medication Sig  . acetaminophen (TYLENOL) 500 MG tablet Take 1,000 mg by mouth every 6 (six) hours as needed for fever.  Marland Kitchen amiodarone (PACERONE) 200 MG tablet Take 1 tablet (200 mg total) by mouth daily.  Marland Kitchen amLODipine (NORVASC) 5 MG tablet Please call to  schedule yearly appointment for further refills [phone]. (Patient taking differently: Take 5 mg by mouth daily. Please call to schedule yearly appointment for further refills [phone].)  . apixaban (ELIQUIS) 5 MG TABS tablet Take 1 tablet (5 mg total) by mouth 2 (two) times daily.  . Ascorbic Acid (VITAMIN C) 250 MG CHEW Chew 1 tablet by mouth daily.  Marland Kitchen atorvastatin (LIPITOR) 40 MG tablet Take 1 tablet (40 mg total) by mouth daily.  Marland Kitchen CALCIUM PO Take 500 mg by mouth daily.  . cetirizine (ZYRTEC) 10 MG tablet Take 10 mg by mouth every evening.  . cholecalciferol (VITAMIN D3) 25 MCG (1000 UT) tablet Take 1,000 Units by mouth daily.  Marland Kitchen eplerenone (INSPRA) 25 MG tablet Please call to schedule early appointment for further refills [phone] (Patient taking differently: Take 25 mg by mouth daily. Please call to schedule early appointment for further refills [phone])  . levothyroxine (SYNTHROID, LEVOTHROID) 25 MCG tablet Take 1 tablet (25 mcg total) by mouth daily before breakfast.  . losartan (COZAAR) 50 MG tablet Take 1 tablet (50 mg total) by mouth daily.  . Multiple Vitamins-Minerals (MULTIVITAMIN GUMMIES ADULT) CHEW Chew 1 tablet by mouth daily.  . Probiotic Product (ALIGN PO) Take 1 capsule by mouth daily.  . vitamin B-12 (CYANOCOBALAMIN) 1000 MCG tablet Take 1,000 mcg by mouth daily.  Allergies:   Corticosteroids; Methylprednisolone; Other; Penicillins; Avocado; Macrobid [nitrofurantoin]; and Cortisone   Social History   Socioeconomic History  . Marital status: Married    Spouse name: Not on file  . Number of children: Not on file  . Years of education: Not on file  . Highest education level: Not on file  Occupational History  . Not on file  Social Needs  . Financial resource strain: Not on file  . Food insecurity:    Worry: Not on file    Inability: Not on file  . Transportation needs:    Medical: Not on file    Non-medical: Not on file  Tobacco Use  . Smoking  status: Never Smoker  . Smokeless tobacco: Never Used  Substance and Sexual Activity  . Alcohol use: Yes    Alcohol/week: 2.0 standard drinks    Types: 2 Glasses of wine per week  . Drug use: No  . Sexual activity: Never  Lifestyle  . Physical activity:    Days per week: Not on file    Minutes per session: Not on file  . Stress: Not on file  Relationships  . Social connections:    Talks on phone: Not on file    Gets together: Not on file    Attends religious service: Not on file    Active member of club or organization: Not on file    Attends meetings of clubs or organizations: Not on file    Relationship status: Not on file  Other Topics Concern  . Not on file  Social History Narrative  . Not on file     Family History:  The patient's family history includes CAD in his father; Colon cancer in an other family member.   ROS:   Please see the history of present illness.    Review of Systems  Constitution: Negative.  HENT: Negative.   Cardiovascular: Negative.   Respiratory: Negative.   Endocrine: Negative.   Hematologic/Lymphatic: Negative.   Musculoskeletal: Negative.   Gastrointestinal: Negative.   Genitourinary: Negative.   Neurological: Negative.    All other systems reviewed and are negative.   PHYSICAL EXAM:   VS:  BP (!) 134/58   Pulse 70   Ht _0  (1.676 m)   Wt 159 lb 12.8 oz (72.5 kg)   SpO2 92%   BMI 25.79 kg/m   Physical Exam  GEN: Well nourished, well developed, in no acute distress  Neck: no JVD, carotid bruits, or masses Cardiac:RRR; 2/6 systolic murmur at the left sternal border 2/6 diastolic murmur at the left sternal border Respiratory:  clear to auscultation bilaterally, normal work of breathing GI: soft, nontender, nondistended, + BS Ext: without cyanosis, clubbing, or edema, Good distal pulses bilaterally Neuro:  Alert and Oriented x 3 Psych: euthymic mood, full affect  Wt Readings from Last 3 Encounters:  09/30/18 159 lb 12.8 oz  (72.5 kg)  09/18/18 163 lb 9.3 oz (74.2 kg)  04/15/18 167 lb 6.4 oz (75.9 kg)      Studies/Labs Reviewed:   EKG:  EKG is not ordered today.   Recent Labs: 09/18/2018: ALT 17; B Natriuretic Peptide 254.7; Magnesium 2.0 09/20/2018: Hemoglobin 10.2; Platelets 240 09/21/2018: BUN 17; Creatinine, Ser 0.94; Potassium 3.5; Sodium 140   Lipid Panel    Component Value Date/Time   CHOL 150 06/04/2013 0615   TRIG 65 06/04/2013 0615   HDL 55 06/04/2013 0615   CHOLHDL 2.7 06/04/2013 0615   VLDL 13 06/04/2013 0615  [place] 82 06/04/2013 0615    Additional studies/ records that were reviewed today include:   2D echo 11/2017 EF 65%, mild AI mild diastolic dysfunction.     ASSESSMENT:    1. Paroxysmal atrial fibrillation (HCC)   2. Chronic diastolic heart failure ([place])   3. Essential hypertension   4. TIA (transient ischemic attack)   5. Hyperlipidemia, unspecified hyperlipidemia type   6. CKD (chronic kidney disease), stage II      PLAN:  In order of problems listed above:  PAF on Eliquis and amiodarone will check TSH and be met in 2 weeks.  LFTs were stable in the hospital chest x-ray was normal.  Chronic diastolic CHF last echo 02/2093 normal LVEF with diastolic dysfunction well compensated today.  Was having ankle edema but this has improved.  2 g sodium diet.  Essential hypertension blood pressure well controlled  History of TIA on Eliquis  Hyperlipidemia on Lipitor monitored by PCP in [place]  CKD stage II creatinine went up to 1.58 in the hospital in the setting of sepsis but normalized to 0.94 09/21/2018 back up to 1.34 on 09/28/2018.  He is on losartan 50 mg daily.  Recheck in 2 weeks.  Medication Adjustments/Labs and Tests Ordered: Current medicines are reviewed at length with the patient today.  Concerns regarding medicines are outlined above.  Medication changes, Labs and Tests ordered today are listed in the Patient Instructions below. Patient Instructions    Medication Instructions:  Your physician recommends that you continue on your current medications as directed. Please refer to the Current Medication list given to you today.  If you need a refill on your cardiac medications before your next appointment, please call your pharmacy.   Lab work: Your physician recommends that you return for lab work in: 2 weeks for BMET and TSH   If you have labs (blood work) drawn today and your tests are completely normal, you will receive your results only by: Marland Kitchen MyChart Message (if you have MyChart) OR . A paper copy in the mail If you have any lab test that is abnormal or we need to change your treatment, we will call you to review the results.  Testing/Procedures: None ordered  Follow-Up: At [facility], you and your health needs are our priority.  As part of our continuing mission to provide you with exceptional heart care, we have created designated Provider Care Teams.  These Care Teams include your primary Cardiologist (physician) and Advanced Practice Providers (APPs -  Physician Assistants and Nurse Practitioners) who all work together to provide you with the care you need, when you need it. . You will need a follow up appointment in 6 months.  Please call our office 2 months in advance to schedule this appointment.  You may see Casandra Doffing, MD or one of the following Advanced Practice Providers on your designated Care Team:   . Lyda Jester, PA-C . Dayna Dunn, PA-C . Ermalinda Barrios, PA-C  Any Other Special Instructions Will Be Listed Below (If Applicable).  Two Gram Sodium Diet 2000 mg  What is Sodium? Sodium is a mineral found naturally in many foods. The most significant source of sodium in the diet is table salt, which is about 40% sodium.  Processed, convenience, and preserved foods also contain a large amount of sodium.  The body needs only 500 mg of sodium daily to function,  A normal diet provides more than enough sodium even  if you do not use salt.  Why Limit Sodium? A build up of sodium in the body can cause thirst, increased blood pressure, shortness of breath, and water retention.  Decreasing sodium in the diet can reduce edema and risk of heart attack or stroke associated with high blood pressure.  Keep in mind that there are many other factors involved in these health problems.  Heredity, obesity, Hendrix of exercise, cigarette smoking, stress and what you eat all play a role.  General Guidelines:  Do not add salt at the table or in cooking.  One teaspoon of salt contains over 2 grams of sodium.  Read food labels  Avoid processed and convenience foods  Ask your dietitian before eating any foods not dicussed in the menu planning guidelines  Consult your physician if you wish to use a salt substitute or a sodium containing medication such as antacids.  Limit milk and milk products to 16 oz (2 cups) per day.  Shopping Hints:  READ LABELS!! "Dietetic" does not necessarily mean low sodium.  Salt and other sodium ingredients are often added to foods during processing.   Menu Planning Guidelines Food Group Choose More Often Avoid  Beverages (see also the milk group All fruit juices, low-sodium, salt-free vegetables juices, low-sodium carbonated beverages Regular vegetable or tomato juices, commercially softened water used for drinking or cooking  Breads and Cereals Enriched white, wheat, rye and pumpernickel bread, hard rolls and dinner rolls; muffins, cornbread and waffles; most dry cereals, cooked cereal without added salt; unsalted crackers and breadsticks; low sodium or homemade bread crumbs Bread, rolls and crackers with salted tops; quick breads; instant hot cereals; pancakes; commercial bread stuffing; self-rising flower and biscuit mixes; regular bread crumbs or cracker crumbs  Desserts and Sweets Desserts and sweets mad with mild should be within allowance Instant pudding mixes and cake mixes  Fats Butter  or margarine; vegetable oils; unsalted salad dressings, regular salad dressings limited to 1 Tbs; light, sour and heavy cream Regular salad dressings containing bacon fat, bacon bits, and salt pork; snack dips made with instant soup mixes or processed cheese; salted nuts  Fruits Most fresh, frozen and canned fruits Fruits processed with salt or sodium-containing ingredient (some dried fruits are processed with sodium sulfites        Vegetables Fresh, frozen vegetables and low- sodium canned vegetables Regular canned vegetables, sauerkraut, pickled vegetables, and others prepared in brine; frozen vegetables in sauces; vegetables seasoned with ham, bacon or salt pork  Condiments, Sauces, Miscellaneous  Salt substitute with physician's approval; pepper, herbs, spices; vinegar, lemon or lime juice; hot pepper sauce; garlic powder, onion powder, low sodium soy sauce (1 Tbs.); low sodium condiments (ketchup, chili sauce, mustard) in limited amounts (1 tsp.) fresh ground horseradish; unsalted tortilla chips, pretzels, potato chips, popcorn, salsa (1/4 cup) Any seasoning made with salt including garlic salt, celery salt, onion salt, and seasoned salt; sea salt, rock salt, kosher salt; meat tenderizers; monosodium glutamate; mustard, regular soy sauce, barbecue, sauce, chili sauce, teriyaki sauce, steak sauce, Worcestershire sauce, and most flavored vinegars; canned gravy and mixes; regular condiments; salted snack foods, olives, picles, relish, horseradish sauce, catsup   Food preparation: Try these seasonings Meats:    Pork Sage, onion Serve with applesauce  Chicken Poultry seasoning, thyme, parsley Serve with cranberry sauce  Lamb Curry powder, rosemary, garlic, thyme Serve with mint sauce or jelly  Veal Marjoram, basil Serve with current jelly, cranberry sauce  Beef Pepper, bay leaf Serve with dry mustard, unsalted chive butter  Fish [place] leaf, dill  Serve with unsalted lemon butter, unsalted parsley  butter  Vegetables:    Asparagus Lemon juice   Broccoli Lemon juice   Carrots Mustard dressing parsley, mint, nutmeg, glazed with unsalted butter and sugar   Green beans Marjoram, lemon juice, nutmeg,dill seed   Tomatoes Basil, marjoram, onion   Spice /blend for [employer]" 4 tsp ground thyme 1 tsp ground sage 3 tsp ground rosemary 4 tsp ground marjoram   Test your knowledge 1. A product that says "Salt Free" may still contain sodium. True or False 2. Garlic Powder and Hot Pepper Sauce an be used as alternative seasonings.True or False 3. Processed foods have more sodium than fresh foods.  True or False 4. Canned Vegetables have less sodium than froze True or False  WAYS TO DECREASE YOUR SODIUM INTAKE 1. Avoid the use of added salt in cooking and at the table.  Table salt (and other prepared seasonings which contain salt) is probably one of the greatest sources of sodium in the diet.  Unsalted foods can gain flavor from the sweet, sour, and butter taste sensations of herbs and spices.  Instead of using salt for seasoning, try the following seasonings with the foods listed.  Remember: how you use them to enhance natural food flavors is limited only by your creativity... Allspice-Meat, fish, eggs, fruit, peas, red and yellow vegetables Almond Extract-Fruit baked goods Anise Seed-Sweet breads, fruit, carrots, beets, cottage cheese, cookies (tastes like licorice) Basil-Meat, fish, eggs, vegetables, rice, vegetables salads, soups, sauces Bay Leaf-Meat, fish, stews, poultry Burnet-Salad, vegetables (cucumber-like flavor) Caraway Seed-Bread, cookies, cottage cheese, meat, vegetables, cheese, rice Cardamon-Baked goods, fruit, soups Celery Powder or seed-Salads, salad dressings, sauces, meatloaf, soup, bread.Do not use  celery salt Chervil-Meats, salads, fish, eggs, vegetables, cottage cheese (parsley-like flavor) Chili Power-Meatloaf, chicken cheese, corn, eggplant, egg dishes Chives-Salads  cottage cheese, egg dishes, soups, vegetables, sauces Cilantro-Salsa, casseroles Cinnamon-Baked goods, fruit, pork, lamb, chicken, carrots Cloves-Fruit, baked goods, fish, pot roast, green beans, beets, carrots Coriander-Pastry, cookies, meat, salads, cheese (lemon-orange flavor) Cumin-Meatloaf, fish,cheese, eggs, cabbage,fruit pie (caraway flavor) [employer], fruit, eggs, fish, poultry, cottage cheese, vegetables Dill Seed-Meat, cottage cheese, poultry, vegetables, fish, salads, bread Fennel Seed-Bread, cookies, apples, pork, eggs, fish, beets, cabbage, cheese, Licorice-like flavor Garlic-(buds or powder) Salads, meat, poultry, fish, bread, butter, vegetables, potatoes.Do not  use garlic salt Ginger-Fruit, vegetables, baked goods, meat, fish, poultry Horseradish Root-Meet, vegetables, butter Lemon Juice or Extract-Vegetables, fruit, tea, baked goods, fish salads Mace-Baked goods fruit, vegetables, fish, poultry (taste like nutmeg) Maple Extract-Syrups Marjoram-Meat, chicken, fish, vegetables, breads, green salads (taste like Sage) Mint-Tea, lamb, sherbet, vegetables, desserts, carrots, cabbage Mustard, Dry or Seed-Cheese, eggs, meats, vegetables, poultry Nutmeg-Baked goods, fruit, chicken, eggs, vegetables, desserts Onion Powder-Meat, fish, poultry, vegetables, cheese, eggs, bread, rice salads (Do not use   Onion salt) Orange Extract-Desserts, baked goods Oregano-Pasta, eggs, cheese, onions, pork, lamb, fish, chicken, vegetables, green salads Paprika-Meat, fish, poultry, eggs, cheese, vegetables Parsley Flakes-Butter, vegetables, meat fish, poultry, eggs, bread, salads (certain forms may   Contain sodium Pepper-Meat fish, poultry, vegetables, eggs Peppermint Extract-Desserts, baked goods Poppy Seed-Eggs, bread, cheese, fruit dressings, baked goods, noodles, vegetables, cottage  [employer], poultry, meat, fish, cauliflower, turnips,eggs  bread Saffron-Rice, bread, veal, chicken, fish, eggs Sage-Meat, fish, poultry, onions, eggplant, tomateos, pork, stews Savory-Eggs, salads, poultry, meat, rice, vegetables, soups, pork Tarragon-Meat, poultry, fish, eggs, butter, vegetables (licorice-like flavor)  Thyme-Meat, poultry, fish, eggs, vegetables, (clover-like flavor), sauces, soups Tumeric-Salads, butter, eggs, fish, rice, vegetables (saffron-like flavor) Vanilla Extract-Baked  goods, candy Vinegar-Salads, vegetables, meat marinades Walnut Extract-baked goods, candy  2. Choose your Foods Wisely   The following is a list of foods to avoid which are high in sodium:  Meats-Avoid all smoked, canned, salt cured, dried and kosher meat and fish as well as Anchovies   [employer] meats:Bologna, Liverwurst, Pastrami Canned meat or fish  Marinated herring Caviar    Pepperoni Corned Beef   Pizza Dried chipped beef  Salami Frozen breaded fish or meat Salt pork Frankfurters or hot dogs  Sardines Gefilte fish   Sausage Ham (boiled ham, Proscuitto Smoked butt    spiced ham)   Spam      TV Dinners Vegetables Canned vegetables (Regular) Relish Canned mushrooms  Sauerkraut Olives    Tomato juice Pickles  Bakery and Dessert Products Canned puddings  Cream pies Cheesecake   Decorated cakes Cookies  Beverages/Juices Tomato juice, regular  Gatorade   V-8 vegetable juice, regular  Breads and Cereals Biscuit mixes   Salted potato chips, corn chips, pretzels Bread stuffing mixes  Salted crackers and rolls Pancake and waffle mixes Self-rising flour  Seasonings Accent    Meat sauces Barbecue sauce  Meat tenderizer Catsup    Monosodium glutamate (MSG) Celery salt   Onion salt Chili sauce   Prepared mustard Garlic salt   Salt, seasoned salt, sea salt Gravy mixes   Soy sauce Horseradish   Steak sauce Ketchup   Tartar sauce Lite salt    Teriyaki sauce Marinade mixes   Worcestershire sauce  Others Baking  powder   Cocoa and cocoa mixes Baking soda   Commercial casserole mixes Candy-caramels, chocolate  Dehydrated soups    Bars, fudge,nougats  Instant rice and pasta mixes Canned broth or soup  Maraschino cherries Cheese, aged and processed cheese and cheese spreads  Learning Assessment Quiz  Indicated T (for True) or F (for False) for each of the following statements:  1. _____ Fresh fruits and vegetables and unprocessed grains are generally low in sodium 2. _____ Water may contain a considerable amount of sodium, depending on the source 3. _____ You can always tell if a food is high in sodium by tasting it 4. _____ Certain laxatives my be high in sodium and should be avoided unless prescribed   by a physician or pharmacist 5. _____ Salt substitutes may be used freely by anyone on a sodium restricted diet 6. _____ Sodium is present in table salt, food additives and as a natural component of   most foods 7. _____ Table salt is approximately 90% sodium 8. _____ Limiting sodium intake may help prevent excess fluid accumulation in the body 9. _____ On a sodium-restricted diet, seasonings such as bouillon soy sauce, and    cooking wine should be used in place of table salt 10. _____ On an ingredient list, a product which lists monosodium glutamate as the first   ingredient is an appropriate food to include on a low sodium diet  Circle the best answer(s) to the following statements (Hint: there may be more than one correct answer)  11. On a low-sodium diet, some acceptable snack items are:    A. Olives  F. Bean dip   K. Grapefruit juice    B. Salted Pretzels G. Commercial Popcorn   L. Canned peaches    C. Carrot Sticks  H. Bouillon   M. Unsalted nuts   D. [place] fries  I. Peanut butter crackers N. Salami   E. Sweet pickles J.  Tomato Juice   O. Pizza  12.  Seasonings that may be used freely on a reduced - sodium diet include   A. Lemon wedges F.Monosodium glutamate K. Celery  seed    B.Soysauce   G. Pepper   L. Mustard powder   C. Sea salt  H. Cooking wine  M. Onion flakes   D. Vinegar  E. Prepared horseradish N. Salsa   E. Sage   J. Worcestershire sauce  O. [address]      Sumner Boast, PA-C  09/30/2018 12:04 PM    [facility] [place], [place], [place]  [address] Phone: [phone]; Fax: [phone]

## 2018-09-30 NOTE — Patient Instructions (Addendum)
Medication Instructions:  Your physician recommends that you continue on your current medications as directed. Please refer to the Current Medication list given to you today.  If you need a refill on your cardiac medications before your next appointment, please call your pharmacy.   Lab work: Your physician recommends that you return for lab work in: 2 weeks for BMET and TSH   If you have labs (blood work) drawn today and your tests are completely normal, you will receive your results only by: Marland Kitchen MyChart Message (if you have MyChart) OR . A paper copy in the mail If you have any lab test that is abnormal or we need to change your treatment, we will call you to review the results.  Testing/Procedures: None ordered  Follow-Up: At [facility], you and your health needs are our priority.  As part of our continuing mission to provide you with exceptional heart care, we have created designated Provider Care Teams.  These Care Teams include your primary Cardiologist (physician) and Advanced Practice Providers (APPs -  Physician Assistants and Nurse Practitioners) who all work together to provide you with the care you need, when you need it. . You will need a follow up appointment in 6 months.  Please call our office 2 months in advance to schedule this appointment.  You may see Casandra Doffing, MD or one of the following Advanced Practice Providers on your designated Care Team:   . Lyda Jester, PA-C . Dayna Dunn, PA-C . Ermalinda Barrios, PA-C  Any Other Special Instructions Will Be Listed Below (If Applicable).  Two Gram Sodium Diet 2000 mg  What is Sodium? Sodium is a mineral found naturally in many foods. The most significant source of sodium in the diet is table salt, which is about 40% sodium.  Processed, convenience, and preserved foods also contain a large amount of sodium.  The body needs only 500 mg of sodium daily to function,  A normal diet provides more than enough sodium even if  you do not use salt.  Why Limit Sodium? A build up of sodium in the body can cause thirst, increased blood pressure, shortness of breath, and water retention.  Decreasing sodium in the diet can reduce edema and risk of heart attack or stroke associated with high blood pressure.  Keep in mind that there are many other factors involved in these health problems.  Heredity, obesity, lack of exercise, cigarette smoking, stress and what you eat all play a role.  General Guidelines:  Do not add salt at the table or in cooking.  One teaspoon of salt contains over 2 grams of sodium.  Read food labels  Avoid processed and convenience foods  Ask your dietitian before eating any foods not dicussed in the menu planning guidelines  Consult your physician if you wish to use a salt substitute or a sodium containing medication such as antacids.  Limit milk and milk products to 16 oz (2 cups) per day.  Shopping Hints:  READ LABELS!! "Dietetic" does not necessarily mean low sodium.  Salt and other sodium ingredients are often added to foods during processing.   Menu Planning Guidelines Food Group Choose More Often Avoid  Beverages (see also the milk group All fruit juices, low-sodium, salt-free vegetables juices, low-sodium carbonated beverages Regular vegetable or tomato juices, commercially softened water used for drinking or cooking  Breads and Cereals Enriched white, wheat, rye and pumpernickel bread, hard rolls and dinner rolls; muffins, cornbread and waffles; most dry cereals, cooked  cereal without added salt; unsalted crackers and breadsticks; low sodium or homemade bread crumbs Bread, rolls and crackers with salted tops; quick breads; instant hot cereals; pancakes; commercial bread stuffing; self-rising flower and biscuit mixes; regular bread crumbs or cracker crumbs  Desserts and Sweets Desserts and sweets mad with mild should be within allowance Instant pudding mixes and cake mixes  Fats Butter or  margarine; vegetable oils; unsalted salad dressings, regular salad dressings limited to 1 Tbs; light, sour and heavy cream Regular salad dressings containing bacon fat, bacon bits, and salt pork; snack dips made with instant soup mixes or processed cheese; salted nuts  Fruits Most fresh, frozen and canned fruits Fruits processed with salt or sodium-containing ingredient (some dried fruits are processed with sodium sulfites        Vegetables Fresh, frozen vegetables and low- sodium canned vegetables Regular canned vegetables, sauerkraut, pickled vegetables, and others prepared in brine; frozen vegetables in sauces; vegetables seasoned with ham, bacon or salt pork  Condiments, Sauces, Miscellaneous  Salt substitute with physician's approval; pepper, herbs, spices; vinegar, lemon or lime juice; hot pepper sauce; garlic powder, onion powder, low sodium soy sauce (1 Tbs.); low sodium condiments (ketchup, chili sauce, mustard) in limited amounts (1 tsp.) fresh ground horseradish; unsalted tortilla chips, pretzels, potato chips, popcorn, salsa (1/4 cup) Any seasoning made with salt including garlic salt, celery salt, onion salt, and seasoned salt; sea salt, rock salt, kosher salt; meat tenderizers; monosodium glutamate; mustard, regular soy sauce, barbecue, sauce, chili sauce, teriyaki sauce, steak sauce, Worcestershire sauce, and most flavored vinegars; canned gravy and mixes; regular condiments; salted snack foods, olives, picles, relish, horseradish sauce, catsup   Food preparation: Try these seasonings Meats:    Pork Sage, onion Serve with applesauce  Chicken Poultry seasoning, thyme, parsley Serve with cranberry sauce  Lamb Curry powder, rosemary, garlic, thyme Serve with mint sauce or jelly  Veal Marjoram, basil Serve with current jelly, cranberry sauce  Beef Pepper, bay leaf Serve with dry mustard, unsalted chive butter  Fish Bay leaf, dill Serve with unsalted lemon butter, unsalted parsley butter   Vegetables:    Asparagus Lemon juice   Broccoli Lemon juice   Carrots Mustard dressing parsley, mint, nutmeg, glazed with unsalted butter and sugar   Green beans Marjoram, lemon juice, nutmeg,dill seed   Tomatoes Basil, marjoram, onion   Spice /blend for [employer]" 4 tsp ground thyme 1 tsp ground sage 3 tsp ground rosemary 4 tsp ground marjoram   Test your knowledge 1. A product that says "Salt Free" may still contain sodium. True or False 2. Garlic Powder and Hot Pepper Sauce an be used as alternative seasonings.True or False 3. Processed foods have more sodium than fresh foods.  True or False 4. Canned Vegetables have less sodium than froze True or False  WAYS TO DECREASE YOUR SODIUM INTAKE 1. Avoid the use of added salt in cooking and at the table.  Table salt (and other prepared seasonings which contain salt) is probably one of the greatest sources of sodium in the diet.  Unsalted foods can gain flavor from the sweet, sour, and butter taste sensations of herbs and spices.  Instead of using salt for seasoning, try the following seasonings with the foods listed.  Remember: how you use them to enhance natural food flavors is limited only by your creativity... Allspice-Meat, fish, eggs, fruit, peas, red and yellow vegetables Almond Extract-Fruit baked goods Anise Seed-Sweet breads, fruit, carrots, beets, cottage cheese, cookies (tastes like licorice) Basil-Meat,  fish, eggs, vegetables, rice, vegetables salads, soups, sauces Bay Leaf-Meat, fish, stews, poultry Burnet-Salad, vegetables (cucumber-like flavor) Caraway Seed-Bread, cookies, cottage cheese, meat, vegetables, cheese, rice Cardamon-Baked goods, fruit, soups Celery Powder or seed-Salads, salad dressings, sauces, meatloaf, soup, bread.Do not use  celery salt Chervil-Meats, salads, fish, eggs, vegetables, cottage cheese (parsley-like flavor) Chili Power-Meatloaf, chicken cheese, corn, eggplant, egg dishes Chives-Salads cottage  cheese, egg dishes, soups, vegetables, sauces Cilantro-Salsa, casseroles Cinnamon-Baked goods, fruit, pork, lamb, chicken, carrots Cloves-Fruit, baked goods, fish, pot roast, green beans, beets, carrots Coriander-Pastry, cookies, meat, salads, cheese (lemon-orange flavor) Cumin-Meatloaf, fish,cheese, eggs, cabbage,fruit pie (caraway flavor) [employer], fruit, eggs, fish, poultry, cottage cheese, vegetables Dill Seed-Meat, cottage cheese, poultry, vegetables, fish, salads, bread Fennel Seed-Bread, cookies, apples, pork, eggs, fish, beets, cabbage, cheese, Licorice-like flavor Garlic-(buds or powder) Salads, meat, poultry, fish, bread, butter, vegetables, potatoes.Do not  use garlic salt Ginger-Fruit, vegetables, baked goods, meat, fish, poultry Horseradish Root-Meet, vegetables, butter Lemon Juice or Extract-Vegetables, fruit, tea, baked goods, fish salads Mace-Baked goods fruit, vegetables, fish, poultry (taste like nutmeg) Maple Extract-Syrups Marjoram-Meat, chicken, fish, vegetables, breads, green salads (taste like Sage) Mint-Tea, lamb, sherbet, vegetables, desserts, carrots, cabbage Mustard, Dry or Seed-Cheese, eggs, meats, vegetables, poultry Nutmeg-Baked goods, fruit, chicken, eggs, vegetables, desserts Onion Powder-Meat, fish, poultry, vegetables, cheese, eggs, bread, rice salads (Do not use   Onion salt) Orange Extract-Desserts, baked goods Oregano-Pasta, eggs, cheese, onions, pork, lamb, fish, chicken, vegetables, green salads Paprika-Meat, fish, poultry, eggs, cheese, vegetables Parsley Flakes-Butter, vegetables, meat fish, poultry, eggs, bread, salads (certain forms may   Contain sodium Pepper-Meat fish, poultry, vegetables, eggs Peppermint Extract-Desserts, baked goods Poppy Seed-Eggs, bread, cheese, fruit dressings, baked goods, noodles, vegetables, cottage  [employer], poultry, meat, fish, cauliflower, turnips,eggs  bread Saffron-Rice, bread, veal, chicken, fish, eggs Sage-Meat, fish, poultry, onions, eggplant, tomateos, pork, stews Savory-Eggs, salads, poultry, meat, rice, vegetables, soups, pork Tarragon-Meat, poultry, fish, eggs, butter, vegetables (licorice-like flavor)  Thyme-Meat, poultry, fish, eggs, vegetables, (clover-like flavor), sauces, soups Tumeric-Salads, butter, eggs, fish, rice, vegetables (saffron-like flavor) Vanilla Extract-Baked goods, candy Vinegar-Salads, vegetables, meat marinades Walnut Extract-baked goods, candy  2. Choose your Foods Wisely   The following is a list of foods to avoid which are high in sodium:  Meats-Avoid all smoked, canned, salt cured, dried and kosher meat and fish as well as Anchovies   [employer] meats:Bologna, Liverwurst, Pastrami Canned meat or fish  Marinated herring Caviar    Pepperoni Corned Beef   Pizza Dried chipped beef  Salami Frozen breaded fish or meat Salt pork Frankfurters or hot dogs  Sardines Gefilte fish   Sausage Ham (boiled ham, Proscuitto Smoked butt    spiced ham)   Spam      TV Dinners Vegetables Canned vegetables (Regular) Relish Canned mushrooms  Sauerkraut Olives    Tomato juice Pickles  Bakery and Dessert Products Canned puddings  Cream pies Cheesecake   Decorated cakes Cookies  Beverages/Juices Tomato juice, regular  Gatorade   V-8 vegetable juice, regular  Breads and Cereals Biscuit mixes   Salted potato chips, corn chips, pretzels Bread stuffing mixes  Salted crackers and rolls Pancake and waffle mixes Self-rising flour  Seasonings Accent    Meat sauces Barbecue sauce  Meat tenderizer Catsup    Monosodium glutamate (MSG) Celery salt   Onion salt Chili sauce   Prepared mustard Garlic salt   Salt, seasoned salt, sea salt Gravy mixes   Soy sauce Horseradish   Steak sauce Ketchup   Tartar  sauce Lite salt    Teriyaki sauce Marinade mixes   Worcestershire sauce  Others Baking  powder   Cocoa and cocoa mixes Baking soda   Commercial casserole mixes Candy-caramels, chocolate  Dehydrated soups    Bars, fudge,nougats  Instant rice and pasta mixes Canned broth or soup  Maraschino cherries Cheese, aged and processed cheese and cheese spreads  Learning Assessment Quiz  Indicated T (for True) or F (for False) for each of the following statements:  1. _____ Fresh fruits and vegetables and unprocessed grains are generally low in sodium 2. _____ Water may contain a considerable amount of sodium, depending on the source 3. _____ You can always tell if a food is high in sodium by tasting it 4. _____ Certain laxatives my be high in sodium and should be avoided unless prescribed   by a physician or pharmacist 5. _____ Salt substitutes may be used freely by anyone on a sodium restricted diet 6. _____ Sodium is present in table salt, food additives and as a natural component of   most foods 7. _____ Table salt is approximately 90% sodium 8. _____ Limiting sodium intake may help prevent excess fluid accumulation in the body 9. _____ On a sodium-restricted diet, seasonings such as bouillon soy sauce, and    cooking wine should be used in place of table salt 10. _____ On an ingredient list, a product which lists monosodium glutamate as the first   ingredient is an appropriate food to include on a low sodium diet  Circle the best answer(s) to the following statements (Hint: there may be more than one correct answer)  11. On a low-sodium diet, some acceptable snack items are:    A. Olives  F. Bean dip   K. Grapefruit juice    B. Salted Pretzels G. Commercial Popcorn   L. Canned peaches    C. Carrot Sticks  H. Bouillon   M. Unsalted nuts   D. [place] fries  I. Peanut butter crackers N. Salami   E. Sweet pickles J. Tomato Juice   O. Pizza  12.  Seasonings that may be used freely on a reduced - sodium diet include   A. Lemon wedges F.Monosodium glutamate K. Celery  seed    B.Soysauce   G. Pepper   L. Mustard powder   C. Sea salt  H. Cooking wine  M. Onion flakes   D. Vinegar  E. Prepared horseradish N. Salsa   E. Sage   J. Worcestershire sauce  O. Chutney

## 2018-10-02 ENCOUNTER — Ambulatory Visit: Payer: Medicare PPO | Admitting: Internal Medicine

## 2018-10-05 ENCOUNTER — Other Ambulatory Visit: Payer: Self-pay | Admitting: Physician Assistant

## 2018-10-05 DIAGNOSIS — E039 Hypothyroidism, unspecified: Secondary | ICD-10-CM

## 2018-10-05 MED ORDER — ATORVASTATIN CALCIUM 40 MG PO TABS: 40.0000 mg | ORAL_TABLET | Freq: Every day | ORAL | 3 refills | Status: DC

## 2018-10-05 MED ORDER — AMLODIPINE BESYLATE 5 MG PO TABS: 5.0000 mg | ORAL_TABLET | Freq: Every day | ORAL | 3 refills | Status: DC

## 2018-10-05 MED ORDER — AMIODARONE HCL 200 MG PO TABS: 200.0000 mg | ORAL_TABLET | Freq: Every day | ORAL | 3 refills | Status: DC

## 2018-10-05 MED ORDER — LOSARTAN POTASSIUM 50 MG PO TABS: 50.0000 mg | ORAL_TABLET | Freq: Every day | ORAL | 3 refills | Status: DC

## 2018-10-05 MED ORDER — EPLERENONE 25 MG PO TABS: 25.0000 mg | ORAL_TABLET | Freq: Every day | ORAL | 3 refills | Status: DC

## 2018-10-05 NOTE — Telephone Encounter (Signed)
New Message    *STAT* If patient is at the pharmacy, call can be transferred to refill team.   1. Which medications need to be refilled? (please list name of each medication and dose if known) Amiodarone 200mg  Amlodipine 5mg  Apixaban 5 mg, Atorvastatin 40mg  Eplerenone 25mg  Levothyroxine 63mcg Losartan 50mg    2. Which pharmacy/location (including street and city if local pharmacy) is medication to be sent to? [facility] [address] in [place]   3. Do they need a 30 day or 90 day supply? 90 days

## 2018-10-05 NOTE — Telephone Encounter (Signed)
Pt pharmacy is requesting a refill on levothyroxine. Would Dr. Irish Lack like to refill this medication? Please address

## 2018-10-06 ENCOUNTER — Other Ambulatory Visit: Payer: Self-pay | Admitting: Physician Assistant

## 2018-10-06 DIAGNOSIS — E039 Hypothyroidism, unspecified: Secondary | ICD-10-CM

## 2018-10-07 ENCOUNTER — Other Ambulatory Visit: Payer: Self-pay | Admitting: Interventional Cardiology

## 2018-10-07 ENCOUNTER — Other Ambulatory Visit: Payer: Self-pay | Admitting: *Deleted

## 2018-10-07 DIAGNOSIS — E039 Hypothyroidism, unspecified: Secondary | ICD-10-CM

## 2018-10-07 MED ORDER — LEVOTHYROXINE SODIUM 25 MCG PO TABS: 25.0000 ug | ORAL_TABLET | Freq: Every day | ORAL | 3 refills | Status: DC

## 2018-10-07 NOTE — Telephone Encounter (Signed)
New Message    *STAT* If patient is at the pharmacy, call can be transferred to refill team.   1. Which medications need to be refilled? (please list name of each medication and dose if known) Levothyroxine 25mg   2. Which pharmacy/location (including street and city if local pharmacy) is medication to be sent to? [facility] [address] in [place]   3. Do they need a 30 day or 90 day supply? 90 day supply

## 2018-10-07 NOTE — Telephone Encounter (Signed)
Medication filled by patients PCP.

## 2018-10-07 NOTE — Telephone Encounter (Signed)
 *  STAT* If patient is at the pharmacy, call can be transferred to refill team.   1. Which medications need to be refilled? (please list name of each medication and dose if known)  apixaban (ELIQUIS) 5 MG TABS tablet levothyroxine (SYNTHROID, LEVOTHROID) 25 MCG tablet   2. Which pharmacy/location (including street and city if local pharmacy) is medication to be sent to? [facility] [place]  3. Do they need a 30 day or 90 day supply? [place]

## 2018-10-09 NOTE — Telephone Encounter (Signed)
Already filled by PCP.

## 2018-10-12 ENCOUNTER — Telehealth: Payer: Self-pay

## 2018-10-12 NOTE — Telephone Encounter (Signed)
Copied from [place] [phone]. Topic: General - Other >> Oct 12, 2018  2:16 PM Windy Kalata wrote: Reason for CRM: Daughter states that he has lost 6 pounds since last visit his heart rate has been low, he is feeling well but having these issues and she would like to know if he should be seen  Best call back is [phone]

## 2018-10-12 NOTE — Telephone Encounter (Signed)
I attempted to call the pts daughter for more information and received a message stating her number and the pts home number are not in service.  Message forwarded to Dr Jerilee Hoh.

## 2018-10-12 NOTE — Telephone Encounter (Signed)
OK to hold for Dr Jerilee Hoh tomorrow.  Would verify what they mean by "low" heart rate.

## 2018-10-13 NOTE — Telephone Encounter (Signed)
Message on both the daughter and patients home number stated they are not in service.  I left a detailed message at the pts son's number [facility]) to return a call the office regarding the pt.  CRM also created.

## 2018-10-13 NOTE — Telephone Encounter (Signed)
Would definitely try and get some more information to see if we need to get him in sooner. If they have concerns might be best to just have them come in regardless.

## 2018-10-14 ENCOUNTER — Telehealth: Payer: Self-pay | Admitting: Physician Assistant

## 2018-10-14 ENCOUNTER — Other Ambulatory Visit: Payer: Medicare PPO | Admitting: *Deleted

## 2018-10-14 DIAGNOSIS — I5032 Chronic diastolic (congestive) heart failure: Secondary | ICD-10-CM

## 2018-10-14 DIAGNOSIS — N182 Chronic kidney disease, stage 2 (mild): Secondary | ICD-10-CM

## 2018-10-14 DIAGNOSIS — I480 Paroxysmal atrial fibrillation: Secondary | ICD-10-CM

## 2018-10-14 DIAGNOSIS — I10 Essential (primary) hypertension: Secondary | ICD-10-CM

## 2018-10-14 DIAGNOSIS — R001 Bradycardia, unspecified: Secondary | ICD-10-CM

## 2018-10-14 DIAGNOSIS — G459 Transient cerebral ischemic attack, unspecified: Secondary | ICD-10-CM

## 2018-10-14 DIAGNOSIS — E785 Hyperlipidemia, unspecified: Secondary | ICD-10-CM

## 2018-10-14 DIAGNOSIS — R42 Dizziness and giddiness: Secondary | ICD-10-CM

## 2018-10-14 LAB — BASIC METABOLIC PANEL
BUN/Creatinine Ratio: 18 (ref 10–24)
BUN: 31 mg/dL — ABNORMAL HIGH (ref 8–27)
CO2: 21 mmol/L (ref 20–29)
Calcium: 9.5 mg/dL (ref 8.6–10.2)
Chloride: 101 mmol/L (ref 96–106)
Creatinine, Ser: 1.75 mg/dL — ABNORMAL HIGH (ref 0.76–1.27)
GFR calc Af Amer: 40 mL/min/{1.73_m2} — ABNORMAL LOW (ref >59)
GFR calc non Af Amer: 34 mL/min/{1.73_m2} — ABNORMAL LOW (ref >59)
Glucose: 85 mg/dL (ref 65–99)
Potassium: 5.4 mmol/L — ABNORMAL HIGH (ref 3.5–5.2)
Sodium: 137 mmol/L (ref 134–144)

## 2018-10-14 LAB — TSH: TSH: 7.85 u[IU]/mL — ABNORMAL HIGH (ref 0.450–4.500)

## 2018-10-14 NOTE — Telephone Encounter (Signed)
Pts cardiac event monitor appt is scheduled for 10/21/18 at 1130 and his follow-up appt with Dr Irish Lack is scheduled for 11/27/2018 at 1140. Pt made aware of appt dates and times by checkout.  Will send this message to both Dr. Irish Lack and covering RN as an Juluis Rainier.

## 2018-10-14 NOTE — Telephone Encounter (Signed)
noted 

## 2018-10-14 NOTE — Telephone Encounter (Signed)
New message   STAT if HR is under 50 or over 120 (normal HR is 60-100 beats per minute)  1) What is your heart rate? 37  Yesterday and for the last 4 days   2) Do you have a log of your heart rate readings (document readings)? No   3) Do you have any other symptoms? His bp has been high 170-165 over 50    Patient is in lobby getting lab work requesting to speak with someone

## 2018-10-14 NOTE — Telephone Encounter (Signed)
Daughter calling back. She states they have [place] phone numbers, but not sure why you cannot get through. But pt has made appt for tomorrow to see Dr Jerilee Hoh and discuss issues

## 2018-10-14 NOTE — Telephone Encounter (Signed)
Pt walked into the clinic for his scheduled lab appt and requested to speak with a nurse, in regards to low HR readings and high BP readings. Pt inquired that his HR readings have been as low as 36 and only as high as the upper 40s.  Pt states his HR usually runs in the high 50-60s.  Pt does have has history of afib and is treated for this.  Pt wanted to schedule an appt for further med management, for he feels that his current dose of meds should be titrated down, due to low HR readings.  Took pts BP and HR myself and noted his HR was 46 bpm and BP 160/54.  Pt states the main symptoms he has been feeling is "whoozy headed when my heart rate is low." He denies any other cardiac complaints. Spoke with Estella Husk PA-C and [place] RN about this pt, for they both recently saw him in clinic on 09/30/18.  Per Estella Husk PA-C, she ordered for the pt to have an event monitor placed, and he should increase his amlodipine from 5 mg po daily to 10 mg po daily, and follow-up with Dr Irish Lack on 11/27/18 at 1140.  When endorsing these orders to the pt, he states that he is not taking his amlodipine 5 mg po daily, for he states that his Doctor in [place] ordered for him to decrease this med to 2.5 mg po daily.  Endorsed this to [place], and they now advised that the pt go back to taking his original dose of amlodipine 5 mg po daily.  Endorsed this to the pt and he agreed to this plan. Pt states he has enough amlodipine 5 mg tablets on hand at this time.  Will take the pt to check-out and have his event monitor appt scheduled and his follow-up appt with Dr Irish Lack scheduled for 11/27/2018 at 1140, per covering RN.  Pt verbalized understanding and agrees with this plan.  Pt gracious for all the assistance provided.

## 2018-10-15 ENCOUNTER — Other Ambulatory Visit: Payer: Self-pay

## 2018-10-15 ENCOUNTER — Ambulatory Visit (INDEPENDENT_AMBULATORY_CARE_PROVIDER_SITE_OTHER): Payer: Medicare PPO | Admitting: Internal Medicine

## 2018-10-15 ENCOUNTER — Encounter: Payer: Self-pay | Admitting: Internal Medicine

## 2018-10-15 VITALS — BP 130/70 | HR 57 | Temp 98.0°F | Wt 157.9 lb

## 2018-10-15 DIAGNOSIS — I10 Essential (primary) hypertension: Secondary | ICD-10-CM

## 2018-10-15 DIAGNOSIS — E039 Hypothyroidism, unspecified: Secondary | ICD-10-CM | POA: Diagnosis not present

## 2018-10-15 DIAGNOSIS — N401 Enlarged prostate with lower urinary tract symptoms: Secondary | ICD-10-CM | POA: Diagnosis not present

## 2018-10-15 DIAGNOSIS — R001 Bradycardia, unspecified: Secondary | ICD-10-CM

## 2018-10-15 DIAGNOSIS — E875 Hyperkalemia: Secondary | ICD-10-CM

## 2018-10-15 DIAGNOSIS — N170 Acute kidney failure with tubular necrosis: Secondary | ICD-10-CM | POA: Diagnosis not present

## 2018-10-15 DIAGNOSIS — N182 Chronic kidney disease, stage 2 (mild): Secondary | ICD-10-CM

## 2018-10-15 MED ORDER — LOSARTAN POTASSIUM 50 MG PO TABS: 25.0000 mg | ORAL_TABLET | Freq: Every day | ORAL | 3 refills | Status: DC

## 2018-10-15 MED ORDER — LEVOTHYROXINE SODIUM 50 MCG PO TABS: 50.0000 ug | ORAL_TABLET | Freq: Every day | ORAL | 3 refills | Status: DC

## 2018-10-15 NOTE — Progress Notes (Signed)
Established Patient Office Visit     CC/Reason for Visit: weight loss  HPI: Stephen Hendrix is a 85 y.o. male who is coming in today for the above mentioned reasons. Past Medical History is significant for: A fib on eliquis and amiodarone, HTN, HLD and chronic diastolic CHF all of which have been well controlled. Several new issues today:  1.HR has been low. As low as 37 but usually in the 40s. He saw cardiology yesterday, had labs drawn and is scheduled for a 30 day event monitor.  2. He jokingly states that his family is trying to kill him because for the past 2 weeks they had him on a ZERO salt diet, and he lost 6 pounds. He has felt lightheaded and dehydrated with decreased urine output. Since re-incorporating salt past 3 days has gained back 4 pounds and feels much better.  3. Labs drawn at cardiology showed an elevated TSH at 7.850 as well as ARF with a Cr of 1.75 up from 0.94 last month.  4. Saw GU yesterday, was told he had BPH and was started on Jalyn.   Past Medical/Surgical History: Past Medical History:  Diagnosis Date  . Atrial fibrillation ([place])   . Cancer ([place])   . Hypertension   . Pneumonia   . TIA (transient ischemic attack)   . Whooping cough     Past Surgical History:  Procedure Laterality Date  . EYE SURGERY    . TONSILLECTOMY      Social History:  reports that he has never smoked. He has never used smokeless tobacco. He reports current alcohol use of about 2.0 standard drinks of alcohol per week. He reports that he does not use drugs.  Allergies: Allergies  Allergen Reactions  . Corticosteroids Shortness Of Breath    HICCUPS AND RESPIRATORY DISTRESS  . Methylprednisolone Shortness Of Breath    Pt tolerates po steroids  . Other Anaphylaxis    IV steroid  . Penicillins Rash    Did it involve swelling of the face/tongue/throat, SOB, or low BP? Skin Rash on Chest Did it involve sudden or severe rash/hives, skin peeling, or any reaction on  the inside of your mouth or nose? No Did you need to seek medical attention at a hospital or doctor's office? No. When did it last happen?Decades ago. If all above answers are "NO", may proceed with cephalosporin use.   Marland Kitchen Avocado     Other reaction(s): Other (See Comments) ORAL PEELING  . Macrobid [Nitrofurantoin]     Rash   . Cortisone     Hiccups for a week Hiccups for a week Hiccups for a week    Family History:  Family History  Problem Relation Age of Onset  . CAD Father   . Colon cancer Other      Current Outpatient Medications:  .  acetaminophen (TYLENOL) 500 MG tablet, Take 1,000 mg by mouth every 6 (six) hours as needed for fever., Disp: , Rfl:  .  amiodarone (PACERONE) 200 MG tablet, Take 1 tablet (200 mg total) by mouth daily., Disp: 90 tablet, Rfl: 3 .  amLODipine (NORVASC) 5 MG tablet, Take 1 tablet (5 mg total) by mouth daily., Disp: 90 tablet, Rfl: 3 .  Ascorbic Acid (VITAMIN C) 250 MG CHEW, Chew 1 tablet by mouth daily., Disp: , Rfl:  .  atorvastatin (LIPITOR) 40 MG tablet, Take 1 tablet (40 mg total) by mouth daily., Disp: 90 tablet, Rfl: 3 .  CALCIUM PO, Take  500 mg by mouth daily., Disp: , Rfl:  .  cetirizine (ZYRTEC) 10 MG tablet, Take 10 mg by mouth every evening., Disp: , Rfl:  .  cholecalciferol (VITAMIN D3) 25 MCG (1000 UT) tablet, Take 1,000 Units by mouth daily., Disp: , Rfl:  .  ELIQUIS 5 MG TABS tablet, TOME UNA TABLETA DOS VECES AL DIA, Disp: 180 tablet, Rfl: 1 .  eplerenone (INSPRA) 25 MG tablet, Take 1 tablet (25 mg total) by mouth daily., Disp: 90 tablet, Rfl: 3 .  levothyroxine (SYNTHROID, LEVOTHROID) 50 MCG tablet, Take 1 tablet (50 mcg total) by mouth daily before breakfast., Disp: 90 tablet, Rfl: 3 .  losartan (COZAAR) 50 MG tablet, Take 0.5 tablets (25 mg total) by mouth daily., Disp: 90 tablet, Rfl: 3 .  Multiple Vitamins-Minerals (MULTIVITAMIN GUMMIES ADULT) CHEW, Chew 1 tablet by mouth daily., Disp: , Rfl:  .  Probiotic Product  (ALIGN PO), Take 1 capsule by mouth daily., Disp: , Rfl:  .  vitamin B-12 (CYANOCOBALAMIN) 1000 MCG tablet, Take 1,000 mcg by mouth daily., Disp: , Rfl:   Review of Systems:  Constitutional: Denies fever, chills, diaphoresis, appetite change and fatigue.  HEENT: Denies photophobia, eye pain, redness, hearing loss, ear pain, congestion, sore throat, rhinorrhea, sneezing, mouth sores, trouble swallowing, neck pain, neck stiffness and tinnitus.   Respiratory: Denies SOB, DOE, cough, chest tightness,  and wheezing.   Cardiovascular: Denies chest pain, palpitations and leg swelling.  Gastrointestinal: Denies nausea, vomiting, abdominal pain, diarrhea, constipation, blood in stool and abdominal distention.  Genitourinary: Denies dysuria, urgency, frequency, hematuria, flank pain and difficulty urinating.  Endocrine: Denies: hot or cold intolerance, sweats, changes in hair or nails, polyuria, polydipsia. Musculoskeletal: Denies myalgias, back pain, joint swelling, arthralgias and gait problem.  Skin: Denies pallor, rash and wound.  Neurological: Denies dizziness, seizures, syncope, weakness, light-headedness, numbness and headaches.  Hematological: Denies adenopathy. Easy bruising, personal or family bleeding history  Psychiatric/Behavioral: Denies suicidal ideation, mood changes, confusion, nervousness, sleep disturbance and agitation    Physical Exam: Vitals:   10/15/18 1355  BP: 130/70  Pulse: (!) 57  Temp: 98 F (36.7 C)  TempSrc: Oral  SpO2: 96%  Weight: 157 lb 14.4 oz (71.6 kg)    Body mass index is 25.49 kg/m.   Constitutional: NAD, calm, comfortable Eyes: PERRL, lids and conjunctivae normal ENMT: Mucous membranes are moist.  Respiratory: clear to auscultation bilaterally, no wheezing, no crackles. Normal respiratory effort. No accessory muscle use.  Cardiovascular: irregularly irregular rhythm, bradycardic, no murmurs / rubs / gallops. No extremity edema. 2+ pedal pulses. No  carotid bruits.  Musculoskeletal: no clubbing / cyanosis. No joint deformity upper and lower extremities. Good ROM, no contractures. Normal muscle tone.  Skin: no rashes, lesions, ulcers. No induration Psychiatric: Normal judgment and insight. Alert and oriented x 3. Normal mood.    Impression and Plan:  Bradycardia -Currently undergoing work up with cardiology. Plan for 30 day event monitor.  Hypothyroidism, unspecified type  -With elevated TSH, will increase synthroid from 25 to 50 mcg. -He knows to return in 4-6 weeks for repeat TSH.  Benign prostatic hyperplasia with lower urinary tract symptoms, symptom details unspecified -Saw GU and was started on Jalyn.  Acute renal failure with tubular necrosis ([facility]CC) -Suspect related to ATN with dehydration in addition to ARB use. -Per cardiology recommendations, losartan decreased from 50 to 25 mg. -He already feels like he is better hydrated since adding some more salt to his diet, has started urinating more. Has been  advised to increase PO fluid intake and will return to see cardiology as scheduled for repeat BMET.  Essential hypertension  -BP is ok today but was high yesterday at cards office. -Will need to watch closely on decreased losartan dose.    Patient Instructions  -Nice seeing you today!!  -Increase levothyroxine to 50 mcg.  -Decrease cozaar to 25 mg.  -Cardiology is supposed to contact you for a follow up appointment.   DASH Eating Plan DASH stands for "Dietary Approaches to Stop Hypertension." The DASH eating plan is a healthy eating plan that has been shown to reduce high blood pressure (hypertension). It may also reduce your risk for type 2 diabetes, heart disease, and stroke. The DASH eating plan may also help with weight loss. What are tips for following this plan?  General guidelines  Avoid eating more than 2,300 mg (milligrams) of salt (sodium) a day. If you have hypertension, you may need to reduce your  sodium intake to 1,500 mg a day.  Limit alcohol intake to no more than 1 drink a day for nonpregnant women and 2 drinks a day for men. One drink equals 12 oz of beer, 5 oz of wine, or 1 oz of hard liquor.  Work with your health care provider to maintain a healthy body weight or to lose weight. Ask what an ideal weight is for you.  Get at least 30 minutes of exercise that causes your heart to beat faster (aerobic exercise) most days of the week. Activities may include walking, swimming, or biking.  Work with your health care provider or diet and nutrition specialist (dietitian) to adjust your eating plan to your individual calorie needs. Reading food labels   Check food labels for the amount of sodium per serving. Choose foods with less than 5 percent of the Daily Value of sodium. Generally, foods with less than 300 mg of sodium per serving fit into this eating plan.  To find whole grains, look for the word "whole" as the first word in the ingredient list. Shopping  Buy products labeled as "low-sodium" or "no salt added."  Buy fresh foods. Avoid canned foods and premade or frozen meals. Cooking  Avoid adding salt when cooking. Use salt-free seasonings or herbs instead of table salt or sea salt. Check with your health care provider or pharmacist before using salt substitutes.  Do not fry foods. Cook foods using healthy methods such as baking, boiling, grilling, and broiling instead.  Cook with heart-healthy oils, such as olive, canola, soybean, or sunflower oil. Meal planning  Eat a balanced diet that includes: ? 5 or more servings of fruits and vegetables each day. At each meal, try to fill half of your plate with fruits and vegetables. ? Up to 6-8 servings of whole grains each day. ? Less than 6 oz of lean meat, poultry, or fish each day. A 3-oz serving of meat is about the same size as a deck of cards. One egg equals 1 oz. ? 2 servings of low-fat dairy each day. ? A serving of  nuts, seeds, or beans 5 times each week. ? Heart-healthy fats. Healthy fats called Omega-3 fatty acids are found in foods such as flaxseeds and coldwater fish, like sardines, salmon, and mackerel.  Limit how much you eat of the following: ? Canned or prepackaged foods. ? Food that is high in trans fat, such as fried foods. ? Food that is high in saturated fat, such as fatty meat. ? Sweets, desserts, sugary drinks,  and other foods with added sugar. ? Full-fat dairy products.  Do not salt foods before eating.  Try to eat at least 2 vegetarian meals each week.  Eat more home-cooked food and less restaurant, buffet, and fast food.  When eating at a restaurant, ask that your food be prepared with less salt or no salt, if possible. What foods are recommended? The items listed may not be a complete list. Talk with your dietitian about what dietary choices are best for you. Grains Whole-grain or whole-wheat bread. Whole-grain or whole-wheat pasta. Brown rice. [place]. Bulgur. Whole-grain and low-sodium cereals. Pita bread. Low-fat, low-sodium crackers. Whole-wheat flour tortillas. Vegetables Fresh or frozen vegetables (raw, steamed, roasted, or grilled). Low-sodium or reduced-sodium tomato and vegetable juice. Low-sodium or reduced-sodium tomato sauce and tomato paste. Low-sodium or reduced-sodium canned vegetables. Fruits All fresh, dried, or frozen fruit. Canned fruit in natural juice (without added sugar). Meat and other protein foods Skinless chicken or [place]. Ground chicken or [place]. Pork with fat trimmed off. Fish and seafood. Egg whites. Dried beans, peas, or lentils. Unsalted nuts, nut butters, and seeds. Unsalted canned beans. Lean cuts of beef with fat trimmed off. Low-sodium, lean deli meat. Dairy Low-fat (1%) or fat-free (skim) milk. Fat-free, low-fat, or reduced-fat cheeses. Nonfat, low-sodium ricotta or cottage cheese. Low-fat or nonfat yogurt. Low-fat, low-sodium  cheese. Fats and oils Soft margarine without trans fats. Vegetable oil. Low-fat, reduced-fat, or light mayonnaise and salad dressings (reduced-sodium). Canola, safflower, olive, soybean, and sunflower oils. Avocado. Seasoning and other foods Herbs. Spices. Seasoning mixes without salt. Unsalted popcorn and pretzels. Fat-free sweets. What foods are not recommended? The items listed may not be a complete list. Talk with your dietitian about what dietary choices are best for you. Grains Baked goods made with fat, such as croissants, muffins, or some breads. Dry pasta or rice meal packs. Vegetables Creamed or fried vegetables. Vegetables in a cheese sauce. Regular canned vegetables (not low-sodium or reduced-sodium). Regular canned tomato sauce and paste (not low-sodium or reduced-sodium). Regular tomato and vegetable juice (not low-sodium or reduced-sodium). Angie Fava. Olives. Fruits Canned fruit in a light or heavy syrup. Fried fruit. Fruit in cream or butter sauce. Meat and other protein foods Fatty cuts of meat. Ribs. Fried meat. Berniece Salines. Sausage. Bologna and other processed lunch meats. Salami. Fatback. Hotdogs. Bratwurst. Salted nuts and seeds. Canned beans with added salt. Canned or smoked fish. Whole eggs or egg yolks. Chicken or [place] with skin. Dairy Whole or 2% milk, cream, and half-and-half. Whole or full-fat cream cheese. Whole-fat or sweetened yogurt. Full-fat cheese. Nondairy creamers. Whipped toppings. Processed cheese and cheese spreads. Fats and oils Butter. Stick margarine. Lard. Shortening. Ghee. Bacon fat. Tropical oils, such as coconut, palm kernel, or palm oil. Seasoning and other foods Salted popcorn and pretzels. Onion salt, garlic salt, seasoned salt, table salt, and sea salt. Worcestershire sauce. Tartar sauce. Barbecue sauce. Teriyaki sauce. Soy sauce, including reduced-sodium. Steak sauce. Canned and packaged gravies. Fish sauce. Oyster sauce. Cocktail sauce. Horseradish  that you find on the shelf. Ketchup. Mustard. Meat flavorings and tenderizers. Bouillon cubes. Hot sauce and Tabasco sauce. Premade or packaged marinades. Premade or packaged taco seasonings. Relishes. Regular salad dressings. Where to find more information:  National Heart, Lung, and [place]: https://wilson-eaton.com/  American Heart Association: www.heart.org Summary  The DASH eating plan is a healthy eating plan that has been shown to reduce high blood pressure (hypertension). It may also reduce your risk for type 2 diabetes, heart disease, and stroke.  With the DASH eating plan, you should limit salt (sodium) intake to 2,300 mg a day. If you have hypertension, you may need to reduce your sodium intake to 1,500 mg a day.  When on the DASH eating plan, aim to eat more fresh fruits and vegetables, whole grains, lean proteins, low-fat dairy, and heart-healthy fats.  Work with your health care provider or diet and nutrition specialist (dietitian) to adjust your eating plan to your individual calorie needs. This information is not intended to replace advice given to you by your health care provider. Make sure you discuss any questions you have with your health care provider. Document Released: 08/01/2011 Document Revised: 08/05/2016 Document Reviewed: 08/05/2016 Elsevier Interactive Patient Education  2019 [place], [place] [facility]

## 2018-10-15 NOTE — Patient Instructions (Signed)
-Nice seeing you today!!  -Increase levothyroxine to 50 mcg.  -Decrease cozaar to 25 mg.  -Cardiology is supposed to contact you for a follow up appointment.   DASH Eating Plan DASH stands for "Dietary Approaches to Stop Hypertension." The DASH eating plan is a healthy eating plan that has been shown to reduce high blood pressure (hypertension). It may also reduce your risk for type 2 diabetes, heart disease, and stroke. The DASH eating plan may also help with weight loss. What are tips for following this plan?  General guidelines  Avoid eating more than 2,300 mg (milligrams) of salt (sodium) a day. If you have hypertension, you may need to reduce your sodium intake to 1,500 mg a day.  Limit alcohol intake to no more than 1 drink a day for nonpregnant women and 2 drinks a day for men. One drink equals 12 oz of beer, 5 oz of wine, or 1 oz of hard liquor.  Work with your health care provider to maintain a healthy body weight or to lose weight. Ask what an ideal weight is for you.  Get at least 30 minutes of exercise that causes your heart to beat faster (aerobic exercise) most days of the week. Activities may include walking, swimming, or biking.  Work with your health care provider or diet and nutrition specialist (dietitian) to adjust your eating plan to your individual calorie needs. Reading food labels   Check food labels for the amount of sodium per serving. Choose foods with less than 5 percent of the Daily Value of sodium. Generally, foods with less than 300 mg of sodium per serving fit into this eating plan.  To find whole grains, look for the word "whole" as the first word in the ingredient list. Shopping  Buy products labeled as "low-sodium" or "no salt added."  Buy fresh foods. Avoid canned foods and premade or frozen meals. Cooking  Avoid adding salt when cooking. Use salt-free seasonings or herbs instead of table salt or sea salt. Check with your health care provider  or pharmacist before using salt substitutes.  Do not fry foods. Cook foods using healthy methods such as baking, boiling, grilling, and broiling instead.  Cook with heart-healthy oils, such as olive, canola, soybean, or sunflower oil. Meal planning  Eat a balanced diet that includes: ? 5 or more servings of fruits and vegetables each day. At each meal, try to fill half of your plate with fruits and vegetables. ? Up to 6-8 servings of whole grains each day. ? Less than 6 oz of lean meat, poultry, or fish each day. A 3-oz serving of meat is about the same size as a deck of cards. One egg equals 1 oz. ? 2 servings of low-fat dairy each day. ? A serving of nuts, seeds, or beans 5 times each week. ? Heart-healthy fats. Healthy fats called Omega-3 fatty acids are found in foods such as flaxseeds and coldwater fish, like sardines, salmon, and mackerel.  Limit how much you eat of the following: ? Canned or prepackaged foods. ? Food that is high in trans fat, such as fried foods. ? Food that is high in saturated fat, such as fatty meat. ? Sweets, desserts, sugary drinks, and other foods with added sugar. ? Full-fat dairy products.  Do not salt foods before eating.  Try to eat at least 2 vegetarian meals each week.  Eat more home-cooked food and less restaurant, buffet, and fast food.  When eating at a restaurant, ask that  your food be prepared with less salt or no salt, if possible. What foods are recommended? The items listed may not be a complete list. Talk with your dietitian about what dietary choices are best for you. Grains Whole-grain or whole-wheat bread. Whole-grain or whole-wheat pasta. Brown rice. [place]. Bulgur. Whole-grain and low-sodium cereals. Pita bread. Low-fat, low-sodium crackers. Whole-wheat flour tortillas. Vegetables Fresh or frozen vegetables (raw, steamed, roasted, or grilled). Low-sodium or reduced-sodium tomato and vegetable juice. Low-sodium or  reduced-sodium tomato sauce and tomato paste. Low-sodium or reduced-sodium canned vegetables. Fruits All fresh, dried, or frozen fruit. Canned fruit in natural juice (without added sugar). Meat and other protein foods Skinless chicken or [place]. Ground chicken or [place]. Pork with fat trimmed off. Fish and seafood. Egg whites. Dried beans, peas, or lentils. Unsalted nuts, nut butters, and seeds. Unsalted canned beans. Lean cuts of beef with fat trimmed off. Low-sodium, lean deli meat. Dairy Low-fat (1%) or fat-free (skim) milk. Fat-free, low-fat, or reduced-fat cheeses. Nonfat, low-sodium ricotta or cottage cheese. Low-fat or nonfat yogurt. Low-fat, low-sodium cheese. Fats and oils Soft margarine without trans fats. Vegetable oil. Low-fat, reduced-fat, or light mayonnaise and salad dressings (reduced-sodium). Canola, safflower, olive, soybean, and sunflower oils. Avocado. Seasoning and other foods Herbs. Spices. Seasoning mixes without salt. Unsalted popcorn and pretzels. Fat-free sweets. What foods are not recommended? The items listed may not be a complete list. Talk with your dietitian about what dietary choices are best for you. Grains Baked goods made with fat, such as croissants, muffins, or some breads. Dry pasta or rice meal packs. Vegetables Creamed or fried vegetables. Vegetables in a cheese sauce. Regular canned vegetables (not low-sodium or reduced-sodium). Regular canned tomato sauce and paste (not low-sodium or reduced-sodium). Regular tomato and vegetable juice (not low-sodium or reduced-sodium). Angie Fava. Olives. Fruits Canned fruit in a light or heavy syrup. Fried fruit. Fruit in cream or butter sauce. Meat and other protein foods Fatty cuts of meat. Ribs. Fried meat. Berniece Salines. Sausage. Bologna and other processed lunch meats. Salami. Fatback. Hotdogs. Bratwurst. Salted nuts and seeds. Canned beans with added salt. Canned or smoked fish. Whole eggs or egg yolks. Chicken or [place]  with skin. Dairy Whole or 2% milk, cream, and half-and-half. Whole or full-fat cream cheese. Whole-fat or sweetened yogurt. Full-fat cheese. Nondairy creamers. Whipped toppings. Processed cheese and cheese spreads. Fats and oils Butter. Stick margarine. Lard. Shortening. Ghee. Bacon fat. Tropical oils, such as coconut, palm kernel, or palm oil. Seasoning and other foods Salted popcorn and pretzels. Onion salt, garlic salt, seasoned salt, table salt, and sea salt. Worcestershire sauce. Tartar sauce. Barbecue sauce. Teriyaki sauce. Soy sauce, including reduced-sodium. Steak sauce. Canned and packaged gravies. Fish sauce. Oyster sauce. Cocktail sauce. Horseradish that you find on the shelf. Ketchup. Mustard. Meat flavorings and tenderizers. Bouillon cubes. Hot sauce and Tabasco sauce. Premade or packaged marinades. Premade or packaged taco seasonings. Relishes. Regular salad dressings. Where to find more information:  National Heart, Lung, and [place]: https://wilson-eaton.com/  American Heart Association: www.heart.org Summary  The DASH eating plan is a healthy eating plan that has been shown to reduce high blood pressure (hypertension). It may also reduce your risk for type 2 diabetes, heart disease, and stroke.  With the DASH eating plan, you should limit salt (sodium) intake to 2,300 mg a day. If you have hypertension, you may need to reduce your sodium intake to 1,500 mg a day.  When on the DASH eating plan, aim to eat more fresh fruits and  vegetables, whole grains, lean proteins, low-fat dairy, and heart-healthy fats.  Work with your health care provider or diet and nutrition specialist (dietitian) to adjust your eating plan to your individual calorie needs. This information is not intended to replace advice given to you by your health care provider. Make sure you discuss any questions you have with your health care provider. Document Released: 08/01/2011 Document Revised: 08/05/2016  Document Reviewed: 08/05/2016 Elsevier Interactive Patient Education  2019 [employer].

## 2018-10-16 ENCOUNTER — Other Ambulatory Visit: Payer: Medicare PPO | Admitting: *Deleted

## 2018-10-16 ENCOUNTER — Ambulatory Visit (INDEPENDENT_AMBULATORY_CARE_PROVIDER_SITE_OTHER): Payer: Medicare PPO | Admitting: Internal Medicine

## 2018-10-16 ENCOUNTER — Encounter: Payer: Self-pay | Admitting: Internal Medicine

## 2018-10-16 VITALS — BP 110/68 | HR 54 | Temp 98.3°F | Wt 159.7 lb

## 2018-10-16 DIAGNOSIS — E875 Hyperkalemia: Secondary | ICD-10-CM

## 2018-10-16 DIAGNOSIS — N182 Chronic kidney disease, stage 2 (mild): Secondary | ICD-10-CM

## 2018-10-16 DIAGNOSIS — L309 Dermatitis, unspecified: Secondary | ICD-10-CM

## 2018-10-16 LAB — BASIC METABOLIC PANEL
BUN/Creatinine Ratio: 19 (ref 10–24)
BUN: 29 mg/dL — AB (ref 8–27)
CALCIUM: 9.1 mg/dL (ref 8.6–10.2)
CO2: 21 mmol/L (ref 20–29)
CREATININE: 1.55 mg/dL — AB (ref 0.76–1.27)
Chloride: 101 mmol/L (ref 96–106)
GFR calc Af Amer: 46 mL/min/{1.73_m2} — ABNORMAL LOW (ref >59)
GFR calc non Af Amer: 40 mL/min/{1.73_m2} — ABNORMAL LOW (ref >59)
Glucose: 162 mg/dL — ABNORMAL HIGH (ref 65–99)
Potassium: 4.8 mmol/L (ref 3.5–5.2)
Sodium: 138 mmol/L (ref 134–144)

## 2018-10-16 NOTE — Progress Notes (Signed)
Established Patient Office Visit     CC/Reason for Visit: Rash on legs and face  HPI: Stephen Hendrix is a 85 y.o. male who is coming in today for the above mentioned reasons.  I saw him yesterday for follow-up.  He forgot to mention at that time a rash that he has been having over his cheek and leg, he returns today for me to assess it.  This is been present for the past 3 days.  He has been applying cortisone over-the-counter cream with some relief.  It is not itchy.    Past Medical/Surgical History: Past Medical History:  Diagnosis Date  . Atrial fibrillation ([place])   . Cancer ([place])   . Hypertension   . Pneumonia   . TIA (transient ischemic attack)   . Whooping cough     Past Surgical History:  Procedure Laterality Date  . EYE SURGERY    . TONSILLECTOMY      Social History:  reports that he has never smoked. He has never used smokeless tobacco. He reports current alcohol use of about 2.0 standard drinks of alcohol per week. He reports that he does not use drugs.  Allergies: Allergies  Allergen Reactions  . Corticosteroids Shortness Of Breath    HICCUPS AND RESPIRATORY DISTRESS  . Methylprednisolone Shortness Of Breath    Pt tolerates po steroids  . Other Anaphylaxis    IV steroid  . Penicillins Rash    Did it involve swelling of the face/tongue/throat, SOB, or low BP? Skin Rash on Chest Did it involve sudden or severe rash/hives, skin peeling, or any reaction on the inside of your mouth or nose? No Did you need to seek medical attention at a hospital or doctor's office? No. When did it last happen?Decades ago. If all above answers are "NO", may proceed with cephalosporin use.   Marland Kitchen Avocado     Other reaction(s): Other (See Comments) ORAL PEELING  . Macrobid [Nitrofurantoin]     Rash   . Cortisone     Hiccups for a week Hiccups for a week Hiccups for a week    Family History:  Family History  Problem Relation Age of Onset  . CAD Father     . Colon cancer Other      Current Outpatient Medications:  .  acetaminophen (TYLENOL) 500 MG tablet, Take 1,000 mg by mouth every 6 (six) hours as needed for fever., Disp: , Rfl:  .  amiodarone (PACERONE) 200 MG tablet, Take 1 tablet (200 mg total) by mouth daily., Disp: 90 tablet, Rfl: 3 .  amLODipine (NORVASC) 5 MG tablet, Take 1 tablet (5 mg total) by mouth daily., Disp: 90 tablet, Rfl: 3 .  Ascorbic Acid (VITAMIN C) 250 MG CHEW, Chew 1 tablet by mouth daily., Disp: , Rfl:  .  atorvastatin (LIPITOR) 40 MG tablet, Take 1 tablet (40 mg total) by mouth daily., Disp: 90 tablet, Rfl: 3 .  CALCIUM PO, Take 500 mg by mouth daily., Disp: , Rfl:  .  cetirizine (ZYRTEC) 10 MG tablet, Take 10 mg by mouth every evening., Disp: , Rfl:  .  cholecalciferol (VITAMIN D3) 25 MCG (1000 UT) tablet, Take 1,000 Units by mouth daily., Disp: , Rfl:  .  ELIQUIS 5 MG TABS tablet, TOME UNA TABLETA DOS VECES AL DIA, Disp: 180 tablet, Rfl: 1 .  eplerenone (INSPRA) 25 MG tablet, Take 1 tablet (25 mg total) by mouth daily., Disp: 90 tablet, Rfl: 3 .  levothyroxine (SYNTHROID,  LEVOTHROID) 50 MCG tablet, Take 1 tablet (50 mcg total) by mouth daily before breakfast., Disp: 90 tablet, Rfl: 3 .  losartan (COZAAR) 50 MG tablet, Take 0.5 tablets (25 mg total) by mouth daily., Disp: 90 tablet, Rfl: 3 .  Multiple Vitamins-Minerals (MULTIVITAMIN GUMMIES ADULT) CHEW, Chew 1 tablet by mouth daily., Disp: , Rfl:  .  Probiotic Product (ALIGN PO), Take 1 capsule by mouth daily., Disp: , Rfl:  .  vitamin B-12 (CYANOCOBALAMIN) 1000 MCG tablet, Take 1,000 mcg by mouth daily., Disp: , Rfl:   Review of Systems:  Constitutional: Denies fever, chills, diaphoresis, appetite change and fatigue.  HEENT: Denies photophobia, eye pain, redness, hearing loss, ear pain, congestion, sore throat, rhinorrhea, sneezing, mouth sores, trouble swallowing, neck pain, neck stiffness and tinnitus.   Respiratory: Denies SOB, DOE, cough, chest tightness,  and  wheezing.   Cardiovascular: Denies chest pain, palpitations and leg swelling.  Gastrointestinal: Denies nausea, vomiting, abdominal pain, diarrhea, constipation, blood in stool and abdominal distention.  Genitourinary: Denies dysuria, urgency, frequency, hematuria, flank pain and difficulty urinating.  Endocrine: Denies: hot or cold intolerance, sweats, changes in hair or nails, polyuria, polydipsia. Musculoskeletal: Denies myalgias, back pain, joint swelling, arthralgias and gait problem.  Skin: Denies pallor, rash and wound.  Neurological: Denies dizziness, seizures, syncope, weakness, light-headedness, numbness and headaches.  Hematological: Denies adenopathy. Easy bruising, personal or family bleeding history  Psychiatric/Behavioral: Denies suicidal ideation, mood changes, confusion, nervousness, sleep disturbance and agitation    Physical Exam: Vitals:   10/16/18 1420  BP: 110/68  Pulse: (!) 54  Temp: 98.3 F (36.8 C)  TempSrc: Oral  SpO2: 96%  Weight: 159 lb 11.2 oz (72.4 kg)    Body mass index is 25.78 kg/m.   Constitutional: NAD, calm, comfortable Eyes: PERRL, lids and conjunctivae normal ENMT: Mucous membranes are moist.  Skin: Small circular patch of erythematous, dry skin surrounding the right corner of his mouth, also present on the dorsal aspect of his lower extremities. Psychiatric: Normal judgment and insight. Alert and oriented x 3. Normal mood.    Impression and Plan:  Eczema, unspecified type -Rash appears to be some eczema, he has been using over-the-counter cortisone and has already been experiencing some relief. -If worsens, have advised triamcinolone cream, in the meantime may continue to use cortisone in addition to regular moisturizing lotions.       Lelon Frohlich, MD [facility]

## 2018-10-20 ENCOUNTER — Ambulatory Visit: Payer: Self-pay | Admitting: *Deleted

## 2018-10-20 NOTE — Telephone Encounter (Signed)
Pt's wife calling to confirm with Dr. Isaac Bliss that it is ok for the patient to be on Doxycycline. Pt was seen at [facility] which is an Urgent Care and was prescribed Doxycyline 100 mg to be taken twice a day x 10 days for facial cellulitis. Pt's wife states that the pt took the first dose of the medication last night and mistakenly took 2 pills at once and then again on this morning the pt took 2 pills at once. Explained to pt's wife that for the next dose of medication on tomorrow the pt should take one dose in the morning and then another dose at night. Understanding verbalized Pt has complaints of nausea and feeling bloated but denies any episodes of emesis or diarrhea. Pt is worried about taking another abx due to his history and being on other antibiotics in the past. Pt's wife states that the pt has an appt with the dermatologist on Friday, but wanted to get Dr. Cresenciano Lick recommendations on being on Doxycyline. Pt's wife, Verdis Frederickson can be contacted at [phone]. Reason for Disposition . [1] DOUBLE DOSE (an extra dose or lesser amount) of antibiotic drug AND [2] NO symptoms  Answer Assessment - Initial Assessment Questions 1. SYMPTOMS: "Do you have any symptoms?"     Nausea and feeling bloated  Protocols used: MEDICATION QUESTION CALL-A-AH

## 2018-10-21 ENCOUNTER — Encounter: Payer: Self-pay | Admitting: *Deleted

## 2018-10-21 ENCOUNTER — Ambulatory Visit (INDEPENDENT_AMBULATORY_CARE_PROVIDER_SITE_OTHER): Payer: Medicare PPO | Admitting: Internal Medicine

## 2018-10-21 ENCOUNTER — Telehealth: Payer: Self-pay | Admitting: *Deleted

## 2018-10-21 ENCOUNTER — Encounter: Payer: Self-pay | Admitting: Internal Medicine

## 2018-10-21 ENCOUNTER — Ambulatory Visit (INDEPENDENT_AMBULATORY_CARE_PROVIDER_SITE_OTHER): Payer: Medicare PPO

## 2018-10-21 VITALS — BP 120/84 | HR 54 | Temp 98.3°F | Wt 160.2 lb

## 2018-10-21 DIAGNOSIS — R001 Bradycardia, unspecified: Secondary | ICD-10-CM | POA: Diagnosis not present

## 2018-10-21 DIAGNOSIS — R42 Dizziness and giddiness: Secondary | ICD-10-CM

## 2018-10-21 DIAGNOSIS — I480 Paroxysmal atrial fibrillation: Secondary | ICD-10-CM

## 2018-10-21 DIAGNOSIS — I10 Essential (primary) hypertension: Secondary | ICD-10-CM | POA: Diagnosis not present

## 2018-10-21 DIAGNOSIS — L03211 Cellulitis of face: Secondary | ICD-10-CM | POA: Diagnosis not present

## 2018-10-21 NOTE — Progress Notes (Signed)
Established Patient Office Visit     CC/Reason for Visit: F/u facial cellulitis  HPI: Stephen Hendrix is a 85 y.o. male who is coming in today for the above mentioned reasons. When he saw me last week he had a small skin irritation at the corner of his mouth that appeared to be eczema and he was advised to continue using cortisone cream that he had available at home. He also applied neosporin. He states over the weekend it got worse, went to an UC and was given doxycycline that he has been taking for 3 days. He thinks it is worse and comes in today for evaluation. No fevers.   Past Medical/Surgical History: Past Medical History:  Diagnosis Date  . Atrial fibrillation ([place])   . Cancer ([place])   . Hypertension   . Pneumonia   . TIA (transient ischemic attack)   . Whooping cough     Past Surgical History:  Procedure Laterality Date  . EYE SURGERY    . TONSILLECTOMY      Social History:  reports that he has never smoked. He has never used smokeless tobacco. He reports current alcohol use of about 2.0 standard drinks of alcohol per week. He reports that he does not use drugs.  Allergies: Allergies  Allergen Reactions  . Corticosteroids Shortness Of Breath    HICCUPS AND RESPIRATORY DISTRESS  . Methylprednisolone Shortness Of Breath    Pt tolerates po steroids  . Other Anaphylaxis    IV steroid  . Penicillins Rash    Did it involve swelling of the face/tongue/throat, SOB, or low BP? Skin Rash on Chest Did it involve sudden or severe rash/hives, skin peeling, or any reaction on the inside of your mouth or nose? No Did you need to seek medical attention at a hospital or doctor's office? No. When did it last happen?Decades ago. If all above answers are "NO", may proceed with cephalosporin use.   Marland Kitchen Avocado     Other reaction(s): Other (See Comments) ORAL PEELING  . Macrobid [Nitrofurantoin]     Rash   . Cortisone     Hiccups for a week Hiccups for a  week Hiccups for a week    Family History:  Family History  Problem Relation Age of Onset  . CAD Father   . Colon cancer Other      Current Outpatient Medications:  .  acetaminophen (TYLENOL) 500 MG tablet, Take 1,000 mg by mouth every 6 (six) hours as needed for fever., Disp: , Rfl:  .  amiodarone (PACERONE) 200 MG tablet, Take 1 tablet (200 mg total) by mouth daily., Disp: 90 tablet, Rfl: 3 .  amLODipine (NORVASC) 5 MG tablet, Take 1 tablet (5 mg total) by mouth daily., Disp: 90 tablet, Rfl: 3 .  Ascorbic Acid (VITAMIN C) 250 MG CHEW, Chew 1 tablet by mouth daily., Disp: , Rfl:  .  atorvastatin (LIPITOR) 40 MG tablet, Take 1 tablet (40 mg total) by mouth daily., Disp: 90 tablet, Rfl: 3 .  CALCIUM PO, Take 500 mg by mouth daily., Disp: , Rfl:  .  cetirizine (ZYRTEC) 10 MG tablet, Take 10 mg by mouth every evening., Disp: , Rfl:  .  cholecalciferol (VITAMIN D3) 25 MCG (1000 UT) tablet, Take 1,000 Units by mouth daily., Disp: , Rfl:  .  ELIQUIS 5 MG TABS tablet, TOME UNA TABLETA DOS VECES AL DIA, Disp: 180 tablet, Rfl: 1 .  eplerenone (INSPRA) 25 MG tablet, Take 1 tablet (25  mg total) by mouth daily., Disp: 90 tablet, Rfl: 3 .  levothyroxine (SYNTHROID, LEVOTHROID) 50 MCG tablet, Take 1 tablet (50 mcg total) by mouth daily before breakfast., Disp: 90 tablet, Rfl: 3 .  losartan (COZAAR) 50 MG tablet, Take 0.5 tablets (25 mg total) by mouth daily., Disp: 90 tablet, Rfl: 3 .  Multiple Vitamins-Minerals (MULTIVITAMIN GUMMIES ADULT) CHEW, Chew 1 tablet by mouth daily., Disp: , Rfl:  .  Probiotic Product (ALIGN PO), Take 1 capsule by mouth daily., Disp: , Rfl:  .  vitamin B-12 (CYANOCOBALAMIN) 1000 MCG tablet, Take 1,000 mcg by mouth daily., Disp: , Rfl:   Review of Systems:  Constitutional: Denies fever, chills, diaphoresis, appetite change and fatigue.  HEENT: Denies photophobia, eye pain, redness, hearing loss, ear pain, congestion, sore throat, rhinorrhea, sneezing, mouth sores, trouble  swallowing, neck pain, neck stiffness and tinnitus.   Respiratory: Denies SOB, DOE, cough, chest tightness,  and wheezing.   Cardiovascular: Denies chest pain, palpitations and leg swelling.  Gastrointestinal: Denies nausea, vomiting, abdominal pain, diarrhea, constipation, blood in stool and abdominal distention.  Genitourinary: Denies dysuria, urgency, frequency, hematuria, flank pain and difficulty urinating.  Endocrine: Denies: hot or cold intolerance, sweats, changes in hair or nails, polyuria, polydipsia. Musculoskeletal: Denies myalgias, back pain, joint swelling, arthralgias and gait problem.  Skin: Denies pallor, rash and wound.  Neurological: Denies dizziness, seizures, syncope, weakness, light-headedness, numbness and headaches.  Hematological: Denies adenopathy. Easy bruising, personal or family bleeding history  Psychiatric/Behavioral: Denies suicidal ideation, mood changes, confusion, nervousness, sleep disturbance and agitation    Physical Exam: Vitals:   10/21/18 1401  BP: 120/84  Pulse: (!) 54  Temp: 98.3 F (36.8 C)  TempSrc: Oral  SpO2: 98%  Weight: 160 lb 3.2 oz (72.7 kg)    Body mass index is 25.86 kg/m.   Constitutional: NAD, calm, comfortable Eyes: PERRL, lids and conjunctivae normal ENMT: Mucous membranes are moist. No issues to inside of cheek.      Psych: Normal mood, Ox3   Impression and Plan:  Facial cellulitis -From pics that he shows me it is actually improved. -Advised to continue doxycycline for the full 10 days. -RTC in 2 weeks if no improvement.      Lelon Frohlich, MD [facility]

## 2018-10-21 NOTE — Telephone Encounter (Signed)
If he has facial cellulitis then needs to continue (he looked ok when he saw me last week). He should take 1 tablet twice daily.

## 2018-10-21 NOTE — Telephone Encounter (Signed)
Not much I can do over the phone. Please have him schedule a follow up if he does not believe this can wait till his derm appointment.

## 2018-10-21 NOTE — Telephone Encounter (Signed)
Unable to reach patient.  I spoke with the daughter in-law and she will call and inform the patient.  A Mychart message was also sent.

## 2018-10-21 NOTE — Telephone Encounter (Signed)
Infection on right check much worse.  Patient has appointment with PA-C of Dr. Juel Burrow for dermatology consult Friday, 10/23/2018.  Patient is concerned it cannot wait that long because it has become red and inflammed.  It is not hot to touch or painful.  Please call patient back with advice.

## 2018-10-22 NOTE — Telephone Encounter (Signed)
Patient seen in the office 10/21/2018

## 2018-11-04 ENCOUNTER — Telehealth: Payer: Self-pay

## 2018-11-04 NOTE — Telephone Encounter (Signed)
Called was transferred directly to [place]. [employer] on the phone reporting patient's monitor showing SB 34 bpm at 1:04 PM EDT and SB 33 bpm at 1:16 PM EDT on 11/04/18. Patient is not on any BB but is on amio 200 mg QD. Reviewed with Dr. Irish Lack. Per Dr. Irish Lack, if patient is asymptomatic continue to monitor with no changes, if patient is symptomatic refer to EP.   Michael,RN reached out to patient and states that patient was sitting watching TV and stood up and had an episode of dizziness that took a couple of minutes for his dizziness to resolve. He states that the patient says that this has been happening for a while. Denies syncope or any other Sx.   Made Dr. Irish Lack aware. Per Dr. Irish Lack, we will continue to monitor with no changes at this time.

## 2018-11-26 ENCOUNTER — Telehealth: Payer: Self-pay

## 2018-11-26 NOTE — Telephone Encounter (Signed)
Patient calling back again

## 2018-11-26 NOTE — Telephone Encounter (Signed)
Spoke with patient who is going to speak with his grandson to help arrange telehealth visit.

## 2018-11-26 NOTE — Telephone Encounter (Signed)
Follow up   Pt returning call, Please all back

## 2018-11-26 NOTE — Telephone Encounter (Signed)
Virtual Visit Pre-Appointment Phone Call   TELEPHONE CALL NOTE  Luigi Stuckey has been deemed a candidate for a follow-up tele-health visit to limit community exposure during the Covid-19 pandemic. I spoke with the patient via phone to ensure availability of phone/video source, confirm preferred email & phone number, and discuss instructions and expectations.  I reminded Chrisotpher Rivero to be prepared with any vital sign and/or heart rhythm information that could potentially be obtained via home monitoring, at the time of his visit. I reminded Tadhg Eskew to expect a phone call at the time of his visit if his visit.  Did the patient verbally acknowledge consent to treatment? YES  Patient will have VIDEO Visit for tomorrow with Dr. Irish Lack. Yolanda Bonine will have [place] on his phone for the visit.  Cleon Gustin, RN 11/26/2018 3:47 PM   DOWNLOADING THE [place], go to [employer] and type in WebEx in the search bar. [place] [employer], the blue/green circle. The app is free but as with any other app downloads, their phone may require them to verify saved payment information or Apple password. The patient does NOT have to create an account.  - If Android, ask patient to go to [employer] and type in WebEx in the search bar. [place] [employer], the blue/green circle. The app is free but as with any other app downloads, their phone may require them to verify saved payment information or Android password. The patient does NOT have to create an account.   CONSENT FOR TELE-HEALTH VISIT - PLEASE REVIEW  I hereby voluntarily request, consent and authorize [facility] and its employed or contracted physicians, physician assistants, nurse practitioners or other licensed health care professionals (the Practitioner), to provide me with telemedicine health care services (the "Services") as deemed necessary by the  treating Practitioner. I acknowledge and consent to receive the Services by the Practitioner via telemedicine. I understand that the telemedicine visit will involve communicating with the Practitioner through live audiovisual communication technology and the disclosure of certain medical information by electronic transmission. I acknowledge that I have been given the opportunity to request an in-person assessment or other available alternative prior to the telemedicine visit and am voluntarily participating in the telemedicine visit.  I understand that I have the right to withhold or withdraw my consent to the use of telemedicine in the course of my care at any time, without affecting my right to future care or treatment, and that the Practitioner or I may terminate the telemedicine visit at any time. I understand that I have the right to inspect all information obtained and/or recorded in the course of the telemedicine visit and may receive copies of available information for a reasonable fee.  I understand that some of the potential risks of receiving the Services via telemedicine include:  Marland Kitchen Delay or interruption in medical evaluation due to technological equipment failure or disruption; . Information transmitted may not be sufficient (e.g. poor resolution of images) to allow for appropriate medical decision making by the Practitioner; and/or  . In rare instances, security protocols could fail, causing a breach of personal health information.  Furthermore, I acknowledge that it is my responsibility to provide information about my medical history, conditions and care that is complete and accurate to the best of my ability. I acknowledge that Practitioner's advice, recommendations, and/or decision may be based on factors not within their control, such as incomplete  or inaccurate data provided by me or distortions of diagnostic images or specimens that may result from electronic transmissions. I understand  that the practice of medicine is not an exact science and that Practitioner makes no warranties or guarantees regarding treatment outcomes. I acknowledge that I will receive a copy of this consent concurrently upon execution via email to the email address I last provided but may also request a printed copy by calling the office of [facility].    I understand that my insurance will be billed for this visit.   I have read or had this consent read to me. . I understand the contents of this consent, which adequately explains the benefits and risks of the Services being provided via telemedicine.  . I have been provided ample opportunity to ask questions regarding this consent and the Services and have had my questions answered to my satisfaction. . I give my informed consent for the services to be provided through the use of telemedicine in my medical care  By participating in this telemedicine visit I agree to the above.

## 2018-11-27 ENCOUNTER — Encounter: Payer: Self-pay | Admitting: Interventional Cardiology

## 2018-11-27 ENCOUNTER — Telehealth (INDEPENDENT_AMBULATORY_CARE_PROVIDER_SITE_OTHER): Payer: Medicare PPO | Admitting: Interventional Cardiology

## 2018-11-27 ENCOUNTER — Other Ambulatory Visit: Payer: Self-pay

## 2018-11-27 DIAGNOSIS — I480 Paroxysmal atrial fibrillation: Secondary | ICD-10-CM | POA: Diagnosis not present

## 2018-11-27 DIAGNOSIS — E782 Mixed hyperlipidemia: Secondary | ICD-10-CM | POA: Diagnosis not present

## 2018-11-27 DIAGNOSIS — N182 Chronic kidney disease, stage 2 (mild): Secondary | ICD-10-CM

## 2018-11-27 DIAGNOSIS — R001 Bradycardia, unspecified: Secondary | ICD-10-CM

## 2018-11-27 DIAGNOSIS — E039 Hypothyroidism, unspecified: Secondary | ICD-10-CM

## 2018-11-27 DIAGNOSIS — I10 Essential (primary) hypertension: Secondary | ICD-10-CM

## 2018-11-27 NOTE — Progress Notes (Signed)
Virtual Visit via Video Note    Evaluation Performed:  Follow-up visit  This visit type was conducted due to national recommendations for restrictions regarding the COVID-19 Pandemic (e.g. social distancing).  This format is felt to be most appropriate for this patient at this time.  All issues noted in this document were discussed and addressed.  No physical exam was performed (except for noted visual exam findings with Video Visits).  Please refer to the patient's chart (MyChart message for video visits and phone note for telephone visits) for the patient's consent to telehealth for [facility].  Date:  11/27/2018   ID:  Stephen Hendrix, DOB 06/08/1929, MRN [id number]  Patient Location:  Home  Provider location:   [facility]  PCP:  Isaac Bliss, Rayford Halsted, MD  Cardiologist:  No primary care provider on file. [place] Electrophysiologist:  None   Chief Complaint:  AFib  History of Present Illness:    Stephen Hendrix is a 85 y.o. male who presents via audio/video conferencing for a telehealth visit today.    Stephen Hendrix  has a history ofPAF controlled on Amiodarone, carotid stenosis, HTN, HL,andprior TIA. He was started on Eliquis in 05/2013 for stroke prophylaxis. CHADS2-VASc=5. He spends 6 months in the [place] and 6 months in [place] also sees Cardiology in [place].  Usually, he spends summers here.    His father lived to 14 and died after falling while running to get the phone. HIs sone is a pain Dr. At [facility].  He had some hematuria in 2017.    Normal ABIs in 2/19 in [place].   Echo in 4/19 was normal.   In the past, he has noticed low HR, in the 40s when he first wakes up.  No lightheadedness or syncope.  He also felt cold in the past.   The patient does not have symptoms concerning for COVID-19 infection (fever, chills, cough, or new shortness of breath).   He was in the hospital with a UTI/urosepsis in Jan 2020, treated with ABx.   TSH was  off so Synthroid was increased. He is now taking it daily.    Denies : Chest pain. Dizziness. Leg edema. Nitroglycerin use. Orthopnea. Palpitations. Paroxysmal nocturnal dyspnea. Shortness of breath. Syncope.   He is trying to walk.   He is sticking to the low sodium diet.    Monitor showed in 10/2018: Sinus bradycardia noted with occasional PACs and PVCs. No atrial fibrillation noted.  He gets dizzy when he stands up if he sits for too long. If he gets up every 15 minutes, this sx resolves. Does not feel that he has been close to passing out.     Prior CV studies:   The following studies were reviewed today:  Cr 1.55, TSH 7.8  Past Medical History:  Diagnosis Date  . Atrial fibrillation ([place])   . Cancer ([place])   . Hypertension   . Pneumonia   . TIA (transient ischemic attack)   . Whooping cough    Past Surgical History:  Procedure Laterality Date  . EYE SURGERY    . TONSILLECTOMY       Current Meds  Medication Sig  . acetaminophen (TYLENOL) 500 MG tablet Take 1,000 mg by mouth every 6 (six) hours as needed for fever.  Marland Kitchen amiodarone (PACERONE) 200 MG tablet Take 1 tablet (200 mg total) by mouth daily.  Marland Kitchen amLODipine (NORVASC) 5 MG tablet Take 1 tablet (5 mg total) by mouth daily.  Marland Kitchen  Ascorbic Acid (VITAMIN C) 250 MG CHEW Chew 1 tablet by mouth daily.  Marland Kitchen atorvastatin (LIPITOR) 40 MG tablet Take 1 tablet (40 mg total) by mouth daily.  Marland Kitchen CALCIUM PO Take 500 mg by mouth daily.  . cetirizine (ZYRTEC) 10 MG tablet Take 10 mg by mouth every evening.  . cholecalciferol (VITAMIN D3) 25 MCG (1000 UT) tablet Take 1,000 Units by mouth daily.  Marland Kitchen ELIQUIS 5 MG TABS tablet TOME UNA TABLETA DOS VECES AL DIA  . eplerenone (INSPRA) 25 MG tablet Take 1 tablet (25 mg total) by mouth daily.  Marland Kitchen levothyroxine (SYNTHROID, LEVOTHROID) 50 MCG tablet Take 1 tablet (50 mcg total) by mouth daily before breakfast.  . losartan (COZAAR) 50 MG tablet Take 0.5 tablets (25 mg total) by mouth daily.  . Multiple  Vitamins-Minerals (MULTIVITAMIN GUMMIES ADULT) CHEW Chew 1 tablet by mouth daily.  . Probiotic Product (ALIGN PO) Take 1 capsule by mouth daily.  . vitamin B-12 (CYANOCOBALAMIN) 1000 MCG tablet Take 1,000 mcg by mouth daily.     Allergies:   Corticosteroids; Methylprednisolone; Other; Penicillins; Avocado; Macrobid [nitrofurantoin]; and Cortisone   Social History   Tobacco Use  . Smoking status: Never Smoker  . Smokeless tobacco: Never Used  Substance Use Topics  . Alcohol use: Yes    Alcohol/week: 2.0 standard drinks    Types: 2 Glasses of wine per week  . Drug use: No     Family Hx: The patient's family history includes CAD in his father; Colon cancer in an other family member.  ROS:   Please see the history of present illness.    Feeling better after hospital stay All other systems reviewed and are negative.   Labs/Other Tests and Data Reviewed:    Recent Labs: 09/18/2018: ALT 17; B Natriuretic Peptide 254.7; Magnesium 2.0 09/20/2018: Hemoglobin 10.2; Platelets 240 10/14/2018: TSH 7.850 10/16/2018: BUN 29; Creatinine, Ser 1.55; Potassium 4.8; Sodium 138   Recent Lipid Panel Lab Results  Component Value Date/Time   CHOL 150 06/04/2013 06:15 AM   TRIG 65 06/04/2013 06:15 AM   HDL 55 06/04/2013 06:15 AM   CHOLHDL 2.7 06/04/2013 06:15 AM   LDLCALC 82 06/04/2013 06:15 AM    Wt Readings from Last 3 Encounters:  11/27/18 152 lb (68.9 kg)  10/21/18 160 lb 3.2 oz (72.7 kg)  10/16/18 159 lb 11.2 oz (72.4 kg)     Objective:    Vital Signs:  BP 128/63   Pulse 61   Ht 5\' 6"  (1.676 m)   Wt 152 lb (68.9 kg)   BMI 24.53 kg/m    Well nourished, well developed male in no acute distress. No apparent shortness of breath  ASSESSMENT & PLAN:    1.  PAF: Amiodarone to maintain NSR.  Eliquis for stroke prevention.  Watch Cr to decide if dose should be reduced to 2.5 mg BID.  Breaducardia noted.  If he has syncope, would have to refer to EP.  At this point, he is feeling well  and will not refer to EP.  2.  Anticoagulated: Need to follow Cr.  Recheck once COVID 19 precautions are eased. No bleeding poroblems at this time.  TOlerating Eliquis well.   3.  Hypothyroid: Synthroid adjusted.  FOllowed by PMD.   4.  Hyperlipidemia: Will need lipids checked when COVID restrictions eased.   HTN: The current medical regimen is effective;  continue present plan and medications.  Amlodipine was decreased recently.  BP controlled.   UTI issues have resolved.  COVID-19 Education: The signs and symptoms of COVID-19 were discussed with the patient and how to seek care for testing (follow up with PCP or arrange E-visit).  The importance of social distancing was discussed today.  Patient Risk:   After full review of this patient's clinical status, I feel that they are at least moderate risk at this time.  Time:   Today, I have spent 25 minutes with the patient with telehealth technology discussing AFib, COVID precautions.     Medication Adjustments/Labs and Tests Ordered: Current medicines are reviewed at length with the patient today.  Concerns regarding medicines are outlined above.  Tests Ordered: No orders of the defined types were placed in this encounter.  Medication Changes: No orders of the defined types were placed in this encounter.   Disposition:  Follow up in 6 month(s)  Signed, Larae Grooms, MD  11/27/2018 12:01 PM    [place]

## 2018-11-27 NOTE — Patient Instructions (Signed)
Medication Instructions:  Your physician recommends that you continue on your current medications as directed. Please refer to the Current Medication list given to you today.  If you need a refill on your cardiac medications before your next appointment, please call your pharmacy.   Lab work: None Ordered  If you have labs (blood work) drawn today and your tests are completely normal, you will receive your results only by: Marland Kitchen MyChart Message (if you have MyChart) OR . A paper copy in the mail If you have any lab test that is abnormal or we need to change your treatment, we will call you to review the results.  Testing/Procedures: None ordered  Follow-Up: At [facility], you and your health needs are our priority.  As part of our continuing mission to provide you with exceptional heart care, we have created designated Provider Care Teams.  These Care Teams include your primary Cardiologist (physician) and Advanced Practice Providers (APPs -  Physician Assistants and Nurse Practitioners) who all work together to provide you with the care you need, when you need it. . You will need a follow up appointment in 6 months.  Please call our office 2 months in advance to schedule this appointment.  You may see Casandra Doffing, MD or one of the following Advanced Practice Providers on your designated Care Team:   . Lyda Jester, PA-C . Dayna Dunn, PA-C . Ermalinda Barrios, PA-C  Any Other Special Instructions Will Be Listed Below (If Applicable).  WE WILL HOLD OFF ON EP REFERRAL FOR NOW. LET us KNOW IF YOUR SYMPTOMS CHANGE OR WORSEN.

## 2019-01-04 ENCOUNTER — Telehealth: Payer: Self-pay | Admitting: Internal Medicine

## 2019-01-04 NOTE — Telephone Encounter (Signed)
Copied from [place] [phone]. Topic: Quick Communication - See Telephone Encounter >> Jan 04, 2019  4:32 PM Valla Leaver wrote: CRM for notification. See Telephone encounter for: 01/04/19. [facility] calling notifying Dr. Joette Catching that losartan (COZAAR) 50 MG tablet is written at the patients pharmacy to take a full tabelt and not half a tablet. Please call pharamcy to correct this if need. Patient's wife says it was supposed to be reduced to 25mg  per day.

## 2019-01-05 NOTE — Telephone Encounter (Signed)
We also have half tablet on file here. With 3 months dispensed on 2/20. Does he need refills? I do not understand what the issue is.

## 2019-01-05 NOTE — Telephone Encounter (Signed)
Nothing further is needed. 

## 2019-04-05 ENCOUNTER — Other Ambulatory Visit: Payer: Self-pay

## 2019-04-05 MED ORDER — APIXABAN 2.5 MG PO TABS: 2.5000 mg | ORAL_TABLET | Freq: Two times a day (BID) | ORAL | 5 refills | Status: DC

## 2019-04-05 NOTE — Telephone Encounter (Signed)
Pt last saw Dr Irish Lack telemedicine visit 11/27/18 Covid-19, last labs 10/14/18 Creat 1.75 repeated on 10/16/18 Creat 1.55, age 85, weight 68.9kg, based on specified criteria pt is not on appropriate dosage of Eliquis 5mg  BID.  Given Creat >1.5 and age >27, recommended dosage of Eliquis is 2.5mg  BID.  Will send to Dr Irish Lack to see if dosage change is appropriate  Staff message sent will await response.

## 2019-04-05 NOTE — Telephone Encounter (Signed)
Called and made the patient aware that he should be on the lower dose of Eliquis 2.5 mg BID. Patient verbalized understanding. Rx sent to preferred pharmacy.

## 2019-04-05 NOTE — Telephone Encounter (Signed)
-----   Message from Jettie Booze, MD sent at 04/05/2019  5:24 PM EDT ----- OK to switch to lower dose of eliquis.    JV ----- Message ----- From: Brynda Peon, RN Sent: 04/05/2019   3:04 PM EDT To: Jettie Booze, MD  Pt last saw Dr Irish Lack telemedicine visit 11/27/18 Covid-19, last labs 10/14/18 Creat 1.75 repeated on 10/16/18 Creat 1.55, age 85, weight 68.9kg, based on specified criteria pt is not on appropriate dosage of Eliquis 5mg  BID.  Given Creat >1.5 and age >4, recommended dosage of Eliquis is 2.5mg  BID.  Will send to Dr Irish Lack to see if dosage change is appropriate, note made at [place] on 11/27/18 pt may need Eliquis dosage reduction.  Please advise if dosage change appropriate.  Thanks

## 2019-04-07 ENCOUNTER — Telehealth: Payer: Self-pay

## 2019-04-07 NOTE — Telephone Encounter (Signed)
-----   Message from Jettie Booze, MD sent at 04/05/2019  5:24 PM EDT ----- OK to switch to lower dose of eliquis.    JV ----- Message ----- From: Brynda Peon, RN Sent: 04/05/2019   3:04 PM EDT To: Jettie Booze, MD  Pt last saw Dr Irish Lack telemedicine visit 11/27/18 Covid-19, last labs 10/14/18 Creat 1.75 repeated on 10/16/18 Creat 1.55, age 85, weight 68.9kg, based on specified criteria pt is not on appropriate dosage of Eliquis 5mg  BID.  Given Creat >1.5 and age >25, recommended dosage of Eliquis is 2.5mg  BID.  Will send to Dr Irish Lack to see if dosage change is appropriate, note made at [place] on 11/27/18 pt may need Eliquis dosage reduction.  Please advise if dosage change appropriate.  Thanks

## 2019-04-07 NOTE — Telephone Encounter (Signed)
New rx sent into pharmacy by Dr Hassell Done nurse.

## 2019-04-21 ENCOUNTER — Telehealth: Payer: Self-pay | Admitting: Interventional Cardiology

## 2019-04-21 ENCOUNTER — Ambulatory Visit (INDEPENDENT_AMBULATORY_CARE_PROVIDER_SITE_OTHER): Payer: Medicare PPO | Admitting: Cardiovascular Disease

## 2019-04-21 ENCOUNTER — Encounter: Payer: Self-pay | Admitting: Cardiovascular Disease

## 2019-04-21 ENCOUNTER — Other Ambulatory Visit: Payer: Self-pay

## 2019-04-21 VITALS — BP 124/62 | HR 68 | Ht 66.0 in | Wt 160.6 lb

## 2019-04-21 DIAGNOSIS — I480 Paroxysmal atrial fibrillation: Secondary | ICD-10-CM | POA: Diagnosis not present

## 2019-04-21 DIAGNOSIS — R079 Chest pain, unspecified: Secondary | ICD-10-CM

## 2019-04-21 LAB — TROPONIN T: Troponin T TROPT: <0.01 ng/mL (ref <0.011)

## 2019-04-21 NOTE — Telephone Encounter (Signed)
I spoke with pt and told him Dr. Burt Knack recommended he go to ED.  Pt again refuses to do this. He reports pain has "mellowed some" now.  I told pt we could not do rapid testing or treatment in the event of a heart attack in the office but pt wants to come in for office visit today. I advised him to come to office now and he will be worked into the office schedule. Pt's wife will drive him to the office.

## 2019-04-21 NOTE — Telephone Encounter (Signed)
With active chest pain the patient should seek immediate medical attention/ER evaluation.  If he is adamant about not doing that, I will see him in the office.  However, we are not equipped to administer rapid testing or treatment in the case of a heart attack.

## 2019-04-21 NOTE — Telephone Encounter (Signed)
Pt c/o of Chest Pain: STAT if CP now or developed within 24 hours  1. Are you having CP right now? Yes, started last night   2. Are you experiencing any other symptoms (ex. SOB, nausea, vomiting, sweating)? No   3. How long have you been experiencing CP? Last night   4. Is your CP continuous or coming and going? Comes and goes   5. Have you taken Nitroglycerin? No  ?

## 2019-04-21 NOTE — Patient Instructions (Signed)
Medication Instructions:  Your provider recommends that you continue on your current medications as directed. Please refer to the Current Medication list given to you today.    Labwork: TODAY: BMET, CBC, Troponin  Testing/Procedures: Your provider has requested that you have a lexiscan myoview. For further information please visit [email or web address]. Please follow instruction sheet, as given.  Follow-Up: 3 months with Dr. Irish Lack.

## 2019-04-21 NOTE — Telephone Encounter (Signed)
I spoke with pt. Preferred language is Spanish but he reports he is completely bilingual. Pt reports he woke up with chest pain.  Describes as 3-4 on 1-10 scale. Comes and goes in severity but never goes completely away. This is new for pt.  I advised pt to call 911 for evaluation and transport to hospital.  Pt refuses to do this and wants to be seen in the office today.  I told pt I would review with doctor in office.

## 2019-04-21 NOTE — Progress Notes (Signed)
Cardiology Office Note:    Date:  04/23/2019   ID:  Stephen Hendrix, DOB 1929-02-27, MRN [id number]  PCP:  Isaac Bliss, Rayford Halsted, MD  Cardiologist:  No primary care provider on file.  Electrophysiologist:  None   Referring MD: Isaac Bliss, [place]*   Chief Complaint  Patient presents with   Chest Pain    History of Present Illness:    Stephen Hendrix is a 85 y.o. male with a hx of paroxysmal atrial fibrillation treated with a rhythm control strategy on amiodarone, carotid stenosis, hypertension, hyperlipidemia, and history of TIA.  The patient is chronically anticoagulated with apixaban.  He called in with chest pain today. We advised ER evaluation per protocol but he refused over concerns of Covid-19 exposure. He was added on to my schedule for further evaluation. Pain has been present intermittently since last night, described now as a burning sensation localized over the left chest. There are no associated symptoms and he specifically denies shortness of breath, diaphoresis, or nausea. No lightheadedness or syncope. The pain is not related to exertion or position. He walked into the office today without change in symptoms. He recently reduced apixaban to 2.5 mg and is concerned that this dose reduction has resulted in chest pain. He took and extra eliquis this morning just in case. Reports pain was a 3/10 in severity at it's worst, currently at a 1/10 (almost gone).   Past Medical History:  Diagnosis Date   Atrial fibrillation ([place])    Cancer ([place])    Hypertension    Pneumonia    TIA (transient ischemic attack)    Whooping cough     Past Surgical History:  Procedure Laterality Date   EYE SURGERY     TONSILLECTOMY      Current Medications: Current Meds  Medication Sig   acetaminophen (TYLENOL) 500 MG tablet Take 1,000 mg by mouth every 6 (six) hours as needed for fever.   amiodarone (PACERONE) 200 MG tablet Take 1 tablet (200 mg total) by mouth  daily.   amLODipine (NORVASC) 2.5 MG tablet Take 2.5 mg by mouth daily.   apixaban (ELIQUIS) 2.5 MG TABS tablet Take 1 tablet (2.5 mg total) by mouth 2 (two) times daily.   Ascorbic Acid (VITAMIN C) 250 MG CHEW Chew 1 tablet by mouth daily.   atorvastatin (LIPITOR) 40 MG tablet Take 1 tablet (40 mg total) by mouth daily.   CALCIUM PO Take 500 mg by mouth daily.   cetirizine (ZYRTEC) 10 MG tablet Take 10 mg by mouth every evening.   cholecalciferol (VITAMIN D3) 25 MCG (1000 UT) tablet Take 1,000 Units by mouth daily.   eplerenone (INSPRA) 25 MG tablet Take 1 tablet (25 mg total) by mouth daily.   levothyroxine (SYNTHROID, LEVOTHROID) 50 MCG tablet Take 1 tablet (50 mcg total) by mouth daily before breakfast.   losartan (COZAAR) 50 MG tablet Take 0.5 tablets (25 mg total) by mouth daily.   Multiple Vitamins-Minerals (MULTIVITAMIN GUMMIES ADULT) CHEW Chew 1 tablet by mouth daily.   Probiotic Product (ALIGN PO) Take 1 capsule by mouth daily.   vitamin B-12 (CYANOCOBALAMIN) 1000 MCG tablet Take 1,000 mcg by mouth daily.     Allergies:   Corticosteroids, Methylprednisolone, Other, Penicillins, Avocado, Macrobid [nitrofurantoin], and Cortisone   Social History   Socioeconomic History   Marital status: Married    Spouse name: Not on file   Number of children: Not on file   Years of education: Not on file  Highest education level: Not on file  Occupational History   Not on file  Social Needs   Financial resource strain: Not on file   Food insecurity    Worry: Not on file    Inability: Not on file   Transportation needs    Medical: Not on file    Non-medical: Not on file  Tobacco Use   Smoking status: Never Smoker   Smokeless tobacco: Never Used  Substance and Sexual Activity   Alcohol use: Yes    Alcohol/week: 2.0 standard drinks    Types: 2 Glasses of wine per week   Drug use: No   Sexual activity: Never  Lifestyle   Physical activity    Days per  week: Not on file    Minutes per session: Not on file   Stress: Not on file  Relationships   Social connections    Talks on phone: Not on file    Gets together: Not on file    Attends religious service: Not on file    Active member of club or organization: Not on file    Attends meetings of clubs or organizations: Not on file    Relationship status: Not on file  Other Topics Concern   Not on file  Social History Narrative   Not on file     Family History: The patient's family history includes CAD in his father; Colon cancer in an other family member.  ROS:   Please see the history of present illness.    All other systems reviewed and are negative.  EKGs/Labs/Other Studies Reviewed:    The following studies were reviewed today: Event monitor 10/21/2018: Study Highlights   Sinus bradycardia noted with occasional PACs and PVCs.  No atrial fibrillation noted.     EKG:  EKG is ordered today.  The ekg ordered today demonstrates NSR 68 bpm, right bundle branch block, LVH   Recent Labs: 09/18/2018: ALT 17; B Natriuretic Peptide 254.7; Magnesium 2.0 10/14/2018: TSH 7.850 04/21/2019: BUN 28; Creatinine, Ser 1.44; Hemoglobin 15.4; Platelets 230; Potassium 4.4; Sodium 142  Recent Lipid Panel    Component Value Date/Time   CHOL 150 06/04/2013 0615   TRIG 65 06/04/2013 0615   HDL 55 06/04/2013 0615   CHOLHDL 2.7 06/04/2013 0615   VLDL 13 06/04/2013 0615   LDLCALC 82 06/04/2013 0615    Physical Exam:    VS:  BP 124/62    Pulse 68    Ht 5\' 6"  (1.676 m)    Wt 160 lb 9.6 oz (72.8 kg)    SpO2 98%    BMI 25.92 kg/m     Wt Readings from Last 3 Encounters:  04/21/19 160 lb 9.6 oz (72.8 kg)  11/27/18 152 lb (68.9 kg)  10/21/18 160 lb 3.2 oz (72.7 kg)     GEN:  Well nourished, well developed pleasant elderly male in no acute distress HEENT: Normal NECK: No JVD; No carotid bruits LYMPHATICS: No lymphadenopathy CARDIAC: RRR, no murmurs, rubs, gallops RESPIRATORY:  Clear to  auscultation without rales, wheezing or rhonchi  ABDOMEN: Soft, non-tender, non-distended MUSCULOSKELETAL:  No edema; No deformity  SKIN: Warm and dry NEUROLOGIC:  Alert and oriented x 3 PSYCHIATRIC:  Normal affect   ASSESSMENT:    1. Chest pain at rest    PLAN:    In order of problems listed above:  1. The patient presents with somewhat atypical chest discomfort.  However, he is at high risk for coronary artery disease with advanced age  and known vascular disease, hypertension, hyperlipidemia, and history of TIA.  Will check a troponin today.  His EKG shows no interval change and there are no diagnostic ST abnormalities.  Will check a pharmacologic myocardial perfusion scan to rule out significant ischemia.  No medicine changes are made today.  He is instructed to seek immediate medical attention if he develops worsening chest pain or any associated symptoms that would raise suspicion of ischemia.   Medication Adjustments/Labs and Tests Ordered: Current medicines are reviewed at length with the patient today.  Concerns regarding medicines are outlined above.  Orders Placed This Encounter  Procedures   Basic metabolic panel   CBC with Differential/Platelet   Troponin T   MYOCARDIAL PERFUSION IMAGING   No orders of the defined types were placed in this encounter.   Patient Instructions  Medication Instructions:  Your provider recommends that you continue on your current medications as directed. Please refer to the Current Medication list given to you today.    Labwork: TODAY: BMET, CBC, Troponin  Testing/Procedures: Your provider has requested that you have a lexiscan myoview. For further information please visit [email or web address]. Please follow instruction sheet, as given.  Follow-Up: 3 months with Dr. Irish Lack.     Signed, Sherren Mocha, MD  04/23/2019 6:35 AM    [place]

## 2019-04-22 LAB — BASIC METABOLIC PANEL
BUN/Creatinine Ratio: 19 (ref 10–24)
BUN: 28 mg/dL — ABNORMAL HIGH (ref 8–27)
CO2: 21 mmol/L (ref 20–29)
Calcium: 9.7 mg/dL (ref 8.6–10.2)
Chloride: 103 mmol/L (ref 96–106)
Creatinine, Ser: 1.44 mg/dL — ABNORMAL HIGH (ref 0.76–1.27)
GFR calc Af Amer: 50 mL/min/{1.73_m2} — ABNORMAL LOW (ref >59)
GFR calc non Af Amer: 43 mL/min/{1.73_m2} — ABNORMAL LOW (ref >59)
Glucose: 101 mg/dL — ABNORMAL HIGH (ref 65–99)
Potassium: 4.4 mmol/L (ref 3.5–5.2)
Sodium: 142 mmol/L (ref 134–144)

## 2019-04-22 LAB — CBC WITH DIFFERENTIAL/PLATELET
Basophils Absolute: 0.1 10*3/uL (ref 0.0–0.2)
Basos: 1 %
EOS (ABSOLUTE): 0.2 10*3/uL (ref 0.0–0.4)
Eos: 2 %
Hematocrit: 46.2 % (ref 37.5–51.0)
Hemoglobin: 15.4 g/dL (ref 13.0–17.7)
Immature Grans (Abs): 0 10*3/uL (ref 0.0–0.1)
Immature Granulocytes: 0 %
Lymphocytes Absolute: 2.2 10*3/uL (ref 0.7–3.1)
Lymphs: 25 %
MCH: 30.9 pg (ref 26.6–33.0)
MCHC: 33.3 g/dL (ref 31.5–35.7)
MCV: 93 fL (ref 79–97)
Monocytes Absolute: 0.7 10*3/uL (ref 0.1–0.9)
Monocytes: 8 %
Neutrophils Absolute: 5.4 10*3/uL (ref 1.4–7.0)
Neutrophils: 64 %
Platelets: 230 10*3/uL (ref 150–450)
RBC: 4.99 x10E6/uL (ref 4.14–5.80)
RDW: 13 % (ref 11.6–15.4)
WBC: 8.6 10*3/uL (ref 3.4–10.8)

## 2019-04-23 ENCOUNTER — Encounter: Payer: Self-pay | Admitting: Cardiovascular Disease

## 2019-04-23 ENCOUNTER — Telehealth (HOSPITAL_COMMUNITY): Payer: Self-pay | Admitting: *Deleted

## 2019-04-23 NOTE — Telephone Encounter (Signed)
Sent email going over instructions for upcoming Myoview on 04/27/19 at 10:45. This was requested by patients wife.

## 2019-04-27 ENCOUNTER — Other Ambulatory Visit: Payer: Self-pay

## 2019-04-27 ENCOUNTER — Ambulatory Visit (HOSPITAL_COMMUNITY): Payer: Medicare PPO | Attending: Cardiovascular Disease

## 2019-04-27 DIAGNOSIS — R079 Chest pain, unspecified: Secondary | ICD-10-CM | POA: Diagnosis not present

## 2019-04-27 LAB — MYOCARDIAL PERFUSION IMAGING
LV dias vol: 120 mL (ref 62–150)
LV sys vol: 40 mL
Peak HR: 66 {beats}/min
Rest HR: 56 {beats}/min
SDS: 1
SRS: 0
SSS: 1
TID: 1.08

## 2019-04-27 MED ORDER — REGADENOSON 0.4 MG/5ML IV SOLN: 0.4000 mg | Freq: Once | INTRAVENOUS | Status: AC

## 2019-04-27 MED ORDER — TECHNETIUM TC 99M TETROFOSMIN IV KIT
32.7000 | PACK | Freq: Once | INTRAVENOUS | Status: AC | PRN
  Filled 2019-04-27: qty 33

## 2019-04-27 MED ORDER — TECHNETIUM TC 99M TETROFOSMIN IV KIT
11.0000 | PACK | Freq: Once | INTRAVENOUS | Status: AC | PRN
  Filled 2019-04-27: qty 11

## 2019-05-04 NOTE — Progress Notes (Signed)
Cardiology Office Note   Date:  05/05/2019   ID:  Stephen Hendrix, DOB [birth year], MRN [id number]  PCP:  Stephen Hendrix, Stephen Halsted, MD    No chief complaint on file.    Wt Readings from Last 3 Encounters:  05/05/19 160 lb (72.6 kg)  04/27/19 160 lb (72.6 kg)  04/21/19 160 lb 9.6 oz (72.8 kg)       History of Present Illness: Stephen Hendrix is a 85 y.o. male  Who has a history ofPAF controlled on Amiodarone, carotid stenosis, HTN, HL,andprior TIA. He was started on Eliquis in 05/2013 for stroke prophylaxis. CHADS2-VASc=5. He spends 6 months in the [place] and 6 months in [place] also sees Cardiology in [place].  Usually, he spends summers here.    His father lived to 44 and died after falling while running to get the phone. HIs sone is a pain Dr. At [facility].  He had some hematuria in 2017.   Normal ABIs in 2/19 in [place].   Echo in 4/19 was normal.  In the past, he has noticed low HR, in the 40s when he first wakes up. No lightheadedness or syncope.  He also felt cold in the past. HR improves when he walks.  He had severe UTI in Jan 2020 and was admitted to ICU.  He received prolonged antibiotics.  In 03/2019, he had some chest pain.  He had a negative troponin nad a negative stress test. EF 67%.  Denies : Chest pain. Dizziness. Leg edema. Nitroglycerin use. Orthopnea. Palpitations. Paroxysmal nocturnal dyspnea. Shortness of breath. Syncope.   Past Medical History:  Diagnosis Date  . Atrial fibrillation ([place])   . Cancer ([place])   . Hypertension   . Pneumonia   . TIA (transient ischemic attack)   . Whooping cough     Past Surgical History:  Procedure Laterality Date  . EYE SURGERY    . TONSILLECTOMY       Current Outpatient Medications  Medication Sig Dispense Refill  . acetaminophen (TYLENOL) 500 MG tablet Take 1,000 mg by mouth every 6 (six) hours as needed for fever.    Marland Kitchen amiodarone (PACERONE) 200 MG tablet Take 1 tablet (200 mg total) by  mouth daily. 90 tablet 3  . amLODipine (NORVASC) 2.5 MG tablet Take 2.5 mg by mouth daily.    Marland Kitchen apixaban (ELIQUIS) 2.5 MG TABS tablet Take 1 tablet (2.5 mg total) by mouth 2 (two) times daily. 60 tablet 5  . Ascorbic Acid (VITAMIN C) 250 MG CHEW Chew 1 tablet by mouth daily.    Marland Kitchen atorvastatin (LIPITOR) 40 MG tablet Take 1 tablet (40 mg total) by mouth daily. 90 tablet 3  . CALCIUM PO Take 500 mg by mouth daily.    . cetirizine (ZYRTEC) 10 MG tablet Take 10 mg by mouth every evening.    . cholecalciferol (VITAMIN D3) 25 MCG (1000 UT) tablet Take 1,000 Units by mouth daily.    Marland Kitchen eplerenone (INSPRA) 25 MG tablet Take 1 tablet (25 mg total) by mouth daily. 90 tablet 3  . levothyroxine (SYNTHROID, LEVOTHROID) 50 MCG tablet Take 1 tablet (50 mcg total) by mouth daily before breakfast. 90 tablet 3  . losartan (COZAAR) 50 MG tablet Take 0.5 tablets (25 mg total) by mouth daily. 90 tablet 3  . Multiple Vitamins-Minerals (MULTIVITAMIN GUMMIES ADULT) CHEW Chew 1 tablet by mouth daily.    . Probiotic Product (ALIGN PO) Take 1 capsule by mouth daily.    . vitamin  B-12 (CYANOCOBALAMIN) 1000 MCG tablet Take 1,000 mcg by mouth daily.     No current facility-administered medications for this visit.    Facility-Administered Medications Ordered in Other Visits  Medication Dose Route Frequency Provider Last Rate Last Dose  . regadenoson (LEXISCAN) injection SOLN 0.4 mg  0.4 mg Intravenous Once Skeet Latch, MD      . technetium tetrofosmin (TC-MYOVIEW) injection 11 millicurie  11 millicurie Intravenous Once PRN Skeet Latch, MD      . technetium tetrofosmin (TC-MYOVIEW) injection [id number] millicurie  [id number] millicurie Intravenous Once PRN Skeet Latch, MD        Allergies:   Corticosteroids, Methylprednisolone, Other, Penicillins, Avocado, Macrobid [nitrofurantoin], and Cortisone    Social History:  The patient  reports that he has never smoked. He has never used smokeless tobacco. He reports current  alcohol use of about 2.0 standard drinks of alcohol per week. He reports that he does not use drugs.   Family History:  The patient's family history includes CAD in his father; Colon cancer in an other family member.    ROS:  Please see the history of present illness.   Otherwise, review of systems are positive for noting slow HR at times, insomnia.   All other systems are reviewed and negative.    PHYSICAL EXAM: VS:  BP (!) 104/40   Pulse 93   Ht 5\' 6"  (1.676 m)   Wt 160 lb (72.6 kg)   SpO2 92%   BMI 25.82 kg/m  , BMI Body mass index is 25.82 kg/m. GEN: Well nourished, well developed, in no acute distress  HEENT: normal  Neck: no JVD, carotid bruits, or masses Cardiac: RRR; 2/6 early systolic murmur, no rubs, or gallops,no edema  Respiratory:  clear to auscultation bilaterally, normal work of breathing GI: soft, nontender, nondistended, + BS MS: no deformity or atrophy  Skin: warm and dry, no rash Neuro:  Strength and sensation are intact Psych: euthymic mood, full affect    Recent Labs: 09/18/2018: ALT 17; B Natriuretic Peptide 254.7; Magnesium 2.0 10/14/2018: TSH 7.850 04/21/2019: BUN 28; Creatinine, Ser 1.44; Hemoglobin 15.4; Platelets 230; Potassium 4.4; Sodium 142   Lipid Panel    Component Value Date/Time   CHOL 150 06/04/2013 0615   TRIG 65 06/04/2013 0615   HDL 55 06/04/2013 0615   CHOLHDL 2.7 06/04/2013 0615   VLDL 13 06/04/2013 0615   LDLCALC 82 06/04/2013 0615     Other studies Reviewed: Additional studies/ records that were reviewed today with results demonstrating: Normal LVEF in 2014 by echo.   ASSESSMENT AND PLAN:  1. PAF: In NSR.  Continue Amio.  Check Amio labs. 2. Anticoagulated: No bleeding issues.  3. Hypothyroid: Check TSH as it has been 6 months.   4. Hyperlipidemia: Will need lipids when he is fasting. Did not go to [place] this year due to COVID, so did not get his usual blood work there.   Current medicines are reviewed at length  with the patient today.  The patient concerns regarding his medicines were addressed.  The following changes have been made:  No change  Labs/ tests ordered today include:  No orders of the defined types were placed in this encounter.   Recommend 150 minutes/week of aerobic exercise Low fat, low carb, high fiber diet recommended  Disposition:   FU in 6 months   Signed, Larae Grooms, MD  05/05/2019 11:07 AM    [place], [place]  [address] Phone: [phone]; Fax: [phone]

## 2019-05-05 ENCOUNTER — Ambulatory Visit (INDEPENDENT_AMBULATORY_CARE_PROVIDER_SITE_OTHER): Payer: Medicare PPO | Admitting: Interventional Cardiology

## 2019-05-05 ENCOUNTER — Encounter: Payer: Self-pay | Admitting: Interventional Cardiology

## 2019-05-05 ENCOUNTER — Other Ambulatory Visit: Payer: Self-pay

## 2019-05-05 VITALS — BP 104/40 | HR 93 | Ht 66.0 in | Wt 160.0 lb

## 2019-05-05 DIAGNOSIS — E782 Mixed hyperlipidemia: Secondary | ICD-10-CM | POA: Diagnosis not present

## 2019-05-05 DIAGNOSIS — I480 Paroxysmal atrial fibrillation: Secondary | ICD-10-CM

## 2019-05-05 DIAGNOSIS — Z7901 Long term (current) use of anticoagulants: Secondary | ICD-10-CM | POA: Diagnosis not present

## 2019-05-05 DIAGNOSIS — E039 Hypothyroidism, unspecified: Secondary | ICD-10-CM | POA: Diagnosis not present

## 2019-05-05 LAB — HEPATIC FUNCTION PANEL
ALT: 22 IU/L (ref 0–44)
AST: 18 IU/L (ref 0–40)
Albumin: 4.4 g/dL (ref 3.6–4.6)
Alkaline Phosphatase: 75 IU/L (ref 39–117)
Bilirubin Total: 0.6 mg/dL (ref 0.0–1.2)
Bilirubin, Direct: 0.2 mg/dL (ref 0.00–0.40)
Total Protein: 6.6 g/dL (ref 6.0–8.5)

## 2019-05-05 LAB — TSH: TSH: 3.79 u[IU]/mL (ref 0.450–4.500)

## 2019-05-05 NOTE — Patient Instructions (Addendum)
Medication Instructions:  Your physician recommends that you continue on your current medications as directed. Please refer to the Current Medication list given to you today.  If you need a refill on your cardiac medications before your next appointment, please call your pharmacy.   Lab work: Your physician recommends that you have labs drawn today: Liver Function and TSH If you have labs (blood work) drawn today and your tests are completely normal, you will receive your results only by: Marland Kitchen MyChart Message (if you have MyChart) OR . A paper copy in the mail If you have any lab test that is abnormal or we need to change your treatment, we will call you to review the results.  Testing/Procedures: None Ordered  Follow-Up: At [facility], you and your health needs are our priority.  As part of our continuing mission to provide you with exceptional heart care, we have created designated Provider Care Teams.  These Care Teams include your primary Cardiologist (physician) and Advanced Practice Providers (APPs -  Physician Assistants and Nurse Practitioners) who all work together to provide you with the care you need, when you need it. You will need a follow up appointment in 6 months.  Please call our office 2 months in advance to schedule this appointment.  You may see Dr. Irish Lack or one of the following Advanced Practice Providers on your designated Care Team:   [place], PA-C Melina Copa, PA-C . Ermalinda Barrios, PA-C  Any Other Special Instructions Will Be Listed Below (If Applicable).

## 2019-05-29 ENCOUNTER — Ambulatory Visit (INDEPENDENT_AMBULATORY_CARE_PROVIDER_SITE_OTHER): Payer: Medicare PPO

## 2019-05-29 ENCOUNTER — Other Ambulatory Visit: Payer: Self-pay

## 2019-05-29 DIAGNOSIS — Z23 Encounter for immunization: Secondary | ICD-10-CM

## 2019-06-22 ENCOUNTER — Ambulatory Visit: Payer: Medicare PPO

## 2019-07-29 ENCOUNTER — Ambulatory Visit: Payer: Medicare PPO | Admitting: Interventional Cardiology

## 2019-08-24 ENCOUNTER — Encounter: Payer: Self-pay | Admitting: Internal Medicine

## 2019-09-01 ENCOUNTER — Ambulatory Visit: Payer: Medicare PPO | Admitting: Internal Medicine

## 2019-09-01 ENCOUNTER — Encounter: Payer: Self-pay | Admitting: Internal Medicine

## 2019-09-01 ENCOUNTER — Other Ambulatory Visit: Payer: Self-pay

## 2019-09-01 VITALS — BP 120/64 | HR 74 | Temp 97.3°F | Wt 157.0 lb

## 2019-09-01 DIAGNOSIS — R1032 Left lower quadrant pain: Secondary | ICD-10-CM

## 2019-09-01 DIAGNOSIS — K5900 Constipation, unspecified: Secondary | ICD-10-CM

## 2019-09-01 MED ORDER — MAGNESIUM CITRATE PO SOLN: 1.0000 | Freq: Once | ORAL | 2 refills | Status: AC

## 2019-09-01 NOTE — Patient Instructions (Signed)
-  Nice seeing you today!!  -Colace 100 mg 1 tablet twice daily.  -Miralax 17 gr daily.  -Mag citrate. Take 1 bottle if you have not had a BM in 3 days with miralax and colace.  -Call me if you have any questions.  -Schedule a 3 month follow up.

## 2019-09-01 NOTE — Progress Notes (Signed)
Acute Office Visit     This visit occurred during the SARS-CoV-2 public health emergency.  Safety protocols were in place, including screening questions prior to the visit, additional usage of staff PPE, and extensive cleaning of exam room while observing appropriate contact time as indicated for disinfecting solutions.    CC/Reason for Visit: LLQ pain and constipation  HPI: Stephen Hendrix is a 86 y.o. male who is coming in today for the above mentioned reasons. He has been having constipation since October. He has a BM every 3-4 days and only with the aid of prune juice. Feces are very hard (he does not even have to use toilet paper). No blood in stool that he has noticed. Has been using miralax infrequently. He has developed some LLQ pain. No fevers. No N/V. He has been walking more and wonders if that may be contributing.  Past Medical/Surgical History: Past Medical History:  Diagnosis Date  . Atrial fibrillation ([place])   . Cancer ([place])   . Hypertension   . Pneumonia   . TIA (transient ischemic attack)   . Whooping cough     Past Surgical History:  Procedure Laterality Date  . EYE SURGERY    . TONSILLECTOMY      Social History:  reports that he has never smoked. He has never used smokeless tobacco. He reports current alcohol use of about 2.0 standard drinks of alcohol per week. He reports that he does not use drugs.  Allergies: Allergies  Allergen Reactions  . Corticosteroids Shortness Of Breath    HICCUPS AND RESPIRATORY DISTRESS  . Methylprednisolone Shortness Of Breath    Pt tolerates po steroids  . Other Anaphylaxis    IV steroid  . Penicillins Rash and Other (See Comments)    Did it involve swelling of the face/tongue/throat, SOB, or low BP? Skin Rash on Chest Did it involve sudden or severe rash/hives, skin peeling, or any reaction on the inside of your mouth or nose? No Did you need to seek medical attention at a hospital or doctor's office?  No. When did it last happen?Decades ago. If all above answers are "NO", may proceed with cephalosporin use.   Marland Kitchen Avocado     Other reaction(s): Other (See Comments) ORAL PEELING  . Macrobid [Nitrofurantoin]     Rash   . Cortisone     Hiccups for a week Hiccups for a week Hiccups for a week    Family History:  Family History  Problem Relation Age of Onset  . CAD Father   . Colon cancer Other      Current Outpatient Medications:  .  acetaminophen (TYLENOL) 500 MG tablet, Take 1,000 mg by mouth every 6 (six) hours as needed for fever., Disp: , Rfl:  .  amiodarone (PACERONE) 200 MG tablet, Take 1 tablet (200 mg total) by mouth daily., Disp: 90 tablet, Rfl: 3 .  amLODipine (NORVASC) 2.5 MG tablet, Take 2.5 mg by mouth daily., Disp: , Rfl:  .  apixaban (ELIQUIS) 2.5 MG TABS tablet, Take 1 tablet (2.5 mg total) by mouth 2 (two) times daily., Disp: 60 tablet, Rfl: 5 .  Ascorbic Acid (VITAMIN C) 250 MG CHEW, Chew 1 tablet by mouth daily., Disp: , Rfl:  .  atorvastatin (LIPITOR) 40 MG tablet, Take 1 tablet (40 mg total) by mouth daily., Disp: 90 tablet, Rfl: 3 .  CALCIUM PO, Take 500 mg by mouth daily., Disp: , Rfl:  .  cetirizine (ZYRTEC) 10 MG tablet,  Take 10 mg by mouth every evening., Disp: , Rfl:  .  cholecalciferol (VITAMIN D3) 25 MCG (1000 UT) tablet, Take 1,000 Units by mouth daily., Disp: , Rfl:  .  eplerenone (INSPRA) 25 MG tablet, Take 1 tablet (25 mg total) by mouth daily., Disp: 90 tablet, Rfl: 3 .  levothyroxine (SYNTHROID, LEVOTHROID) 50 MCG tablet, Take 1 tablet (50 mcg total) by mouth daily before breakfast., Disp: 90 tablet, Rfl: 3 .  losartan (COZAAR) 50 MG tablet, Take 0.5 tablets (25 mg total) by mouth daily., Disp: 90 tablet, Rfl: 3 .  Multiple Vitamins-Minerals (MULTIVITAMIN GUMMIES ADULT) CHEW, Chew 1 tablet by mouth daily., Disp: , Rfl:  .  vitamin B-12 (CYANOCOBALAMIN) 1000 MCG tablet, Take 1,000 mcg by mouth daily., Disp: , Rfl:  .  magnesium citrate SOLN,  Take 296 mLs (1 Bottle total) by mouth once for 1 dose., Disp: 195 mL, Rfl: 2 No current facility-administered medications for this visit.  Facility-Administered Medications Ordered in Other Visits:  .  regadenoson (LEXISCAN) injection SOLN 0.4 mg, 0.4 mg, Intravenous, Once, Skeet Latch, MD .  technetium tetrofosmin (TC-MYOVIEW) injection 11 millicurie, 11 millicurie, Intravenous, Once PRN, Skeet Latch, MD .  technetium tetrofosmin (TC-MYOVIEW) injection [id number] millicurie, [id number] millicurie, Intravenous, Once PRN, Skeet Latch, MD  Review of Systems:  Constitutional: Denies fever, chills, diaphoresis, appetite change and fatigue.  HEENT: Denies photophobia, eye pain, redness, hearing loss, ear pain, congestion, sore throat, rhinorrhea, sneezing, mouth sores, trouble swallowing, neck pain, neck stiffness and tinnitus.   Respiratory: Denies SOB, DOE, cough, chest tightness,  and wheezing.   Cardiovascular: Denies chest pain, palpitations and leg swelling.  Gastrointestinal: Denies nausea, vomiting,  diarrhea,  blood in stool and abdominal distention.  Genitourinary: Denies dysuria, urgency, frequency, hematuria, flank pain and difficulty urinating.  Endocrine: Denies: hot or cold intolerance, sweats, changes in hair or nails, polyuria, polydipsia. Musculoskeletal: Denies myalgias, back pain, joint swelling, arthralgias and gait problem.  Skin: Denies pallor, rash and wound.  Neurological: Denies dizziness, seizures, syncope, weakness, light-headedness, numbness and headaches.  Hematological: Denies adenopathy. Easy bruising, personal or family bleeding history  Psychiatric/Behavioral: Denies suicidal ideation, mood changes, confusion, nervousness, sleep disturbance and agitation    Physical Exam: Vitals:   09/01/19 0705  BP: 120/64  Pulse: 74  Temp: (!) 97.3 F (36.3 C)  TempSrc: Temporal  SpO2: 98%  Weight: 157 lb (71.2 kg)    Body mass index is 25.34  kg/m.   Constitutional: NAD, calm, comfortable Eyes: PERRL, lids and conjunctivae normal ENMT: Mucous membranes are moist.   Abdomen: no tenderness, no masses palpated. No hepatosplenomegaly. Bowel sounds positive. No rebound, no guarding, no apparent inguinal hernias. Musculoskeletal: no clubbing / cyanosis. No joint deformity upper and lower extremities. Good ROM, no contractures. Normal muscle tone.  Psychiatric: Normal judgment and insight. Alert and oriented x 3. Normal mood.    Impression and Plan:  Constipation, unspecified constipation type  LLQ pain  -Have advised daily use of miralax and colace. -Will give Rx of Mag citrate to use as needed. -He will contact me if any further issues. -Do not believe diverticulitis is at play given benign abdominal exam and lack of other concerning symptoms such as n/v/fever. -Also no signs of inguinal hernia to explain pain.    Patient Instructions  -Nice seeing you today!!  -Colace 100 mg 1 tablet twice daily.  -Miralax 17 gr daily.  -Mag citrate. Take 1 bottle if you have not had a BM in 3 days with miralax  and colace.  -Call me if you have any questions.  -Schedule a 3 month follow up.       Lelon Frohlich, MD [facility]

## 2019-09-27 ENCOUNTER — Encounter: Payer: Self-pay | Admitting: Internal Medicine

## 2019-09-27 ENCOUNTER — Ambulatory Visit: Payer: Medicare PPO

## 2019-09-27 DIAGNOSIS — E039 Hypothyroidism, unspecified: Secondary | ICD-10-CM

## 2019-09-27 DIAGNOSIS — I10 Essential (primary) hypertension: Secondary | ICD-10-CM

## 2019-09-27 MED ORDER — AMIODARONE HCL 200 MG PO TABS: 200.0000 mg | ORAL_TABLET | Freq: Every day | ORAL | 2 refills | Status: DC

## 2019-09-27 MED ORDER — ATORVASTATIN CALCIUM 40 MG PO TABS: 40.0000 mg | ORAL_TABLET | Freq: Every day | ORAL | 2 refills | Status: DC

## 2019-09-27 MED ORDER — APIXABAN 2.5 MG PO TABS: 2.5000 mg | ORAL_TABLET | Freq: Two times a day (BID) | ORAL | 3 refills | Status: DC

## 2019-09-27 MED ORDER — AMLODIPINE BESYLATE 2.5 MG PO TABS: 2.5000 mg | ORAL_TABLET | Freq: Every day | ORAL | 2 refills | Status: DC

## 2019-09-27 MED ORDER — EPLERENONE 25 MG PO TABS: 25.0000 mg | ORAL_TABLET | Freq: Every day | ORAL | 2 refills | Status: DC

## 2019-09-27 NOTE — Telephone Encounter (Signed)
Pt last saw Dr Irish Lack 05/05/19, last labs 04/21/19 Creat 1.44, age 86, weight 71.2kg.  Pt's Creat has historically been elevated above 1.5, on 10/16/18 Creat 1.55, on 10/14/18 Creat 1.75.  Given Creat is borderline at this time 1.44, will keep pt on Eliquis 2.5mg  BID at present and re-evaluate in 3 months. Pt has recall appt for Dr Irish Lack 11/01/2019 6 month follow-up.  Note placed on appt to draw CBC and BMP at that time to re-evaluate Eliquis dosage.

## 2019-09-27 NOTE — Telephone Encounter (Signed)
I have been out of the office since 09/21/19. See MyChart message/response sent today 09/27/19.

## 2019-09-28 MED ORDER — LEVOTHYROXINE SODIUM 50 MCG PO TABS: 50.0000 ug | ORAL_TABLET | Freq: Every day | ORAL | 1 refills | Status: DC

## 2019-09-28 MED ORDER — LOSARTAN POTASSIUM 50 MG PO TABS: 25.0000 mg | ORAL_TABLET | Freq: Every day | ORAL | 1 refills | Status: DC

## 2019-10-03 ENCOUNTER — Ambulatory Visit: Payer: Medicare FFS

## 2019-10-04 ENCOUNTER — Ambulatory Visit: Payer: Medicare FFS | Attending: Internal Medicine

## 2019-10-04 DIAGNOSIS — Z23 Encounter for immunization: Secondary | ICD-10-CM | POA: Insufficient documentation

## 2019-10-04 NOTE — Progress Notes (Signed)
   Covid-19 Vaccination Clinic  Name:  Ramez Marberry    MRN: [id number] DOB: 14-Dec-1928  10/04/2019  Mr. Armari Point was observed post Covid-19 immunization for 15 minutes without incidence. He was provided with Vaccine Information Sheet and instruction to access the V-Safe system.   Mr. Chinemerem Aldaba was instructed to call 911 with any severe reactions post vaccine: Marland Kitchen Difficulty breathing  . Swelling of your face and throat  . A fast heartbeat  . A bad rash all over your body  . Dizziness and weakness    Immunizations Administered    Name Date Dose VIS Date Route   Pfizer COVID-19 Vaccine 10/04/2019  1:54 PM 0.3 mL 08/06/2019 Intramuscular   Manufacturer: [place]   Lot: [id number]   [place]: [id number]

## 2019-10-29 ENCOUNTER — Ambulatory Visit: Payer: Medicare FFS | Attending: Internal Medicine

## 2019-10-29 DIAGNOSIS — Z23 Encounter for immunization: Secondary | ICD-10-CM | POA: Insufficient documentation

## 2019-10-29 NOTE — Progress Notes (Signed)
   Covid-19 Vaccination Clinic  Name:  Esequiel Schauer    MRN: [id number] DOB: 08-Aug-1929  10/29/2019  Mr. Youness Lawes was observed post Covid-19 immunization for 30 minutes based on pre-vaccination screening without incident. He was provided with Vaccine Information Sheet and instruction to access the V-Safe system.   Mr. Emmeric Avetisyan was instructed to call 911 with any severe reactions post vaccine: Marland Kitchen Difficulty breathing  . Swelling of face and throat  . A fast heartbeat  . A bad rash all over body  . Dizziness and weakness   Immunizations Administered    Name Date Dose VIS Date Route   Pfizer COVID-19 Vaccine 10/29/2019  1:24 PM 0.3 mL 08/06/2019 Intramuscular   Manufacturer: [place]   Lot: [id number]   [place]: [id number]

## 2020-02-09 DIAGNOSIS — I10 Essential (primary) hypertension: Secondary | ICD-10-CM

## 2020-02-10 MED ORDER — AMIODARONE HCL 200 MG PO TABS: 200.0000 mg | ORAL_TABLET | Freq: Every day | ORAL | 3 refills | Status: DC

## 2020-02-10 MED ORDER — ATORVASTATIN CALCIUM 40 MG PO TABS: 40.0000 mg | ORAL_TABLET | Freq: Every day | ORAL | 3 refills | Status: AC

## 2020-02-10 MED ORDER — LOSARTAN POTASSIUM 50 MG PO TABS: 25.0000 mg | ORAL_TABLET | Freq: Every day | ORAL | 3 refills | Status: DC

## 2020-02-10 MED ORDER — AMLODIPINE BESYLATE 2.5 MG PO TABS: 2.5000 mg | ORAL_TABLET | Freq: Every day | ORAL | 3 refills | Status: DC

## 2020-02-10 MED ORDER — APIXABAN 2.5 MG PO TABS: 2.5000 mg | ORAL_TABLET | Freq: Two times a day (BID) | ORAL | 3 refills | Status: AC

## 2020-03-21 ENCOUNTER — Ambulatory Visit: Payer: Medicare FFS | Admitting: Interventional Cardiology

## 2020-03-27 ENCOUNTER — Other Ambulatory Visit: Payer: Self-pay | Admitting: Internal Medicine

## 2020-03-27 DIAGNOSIS — E039 Hypothyroidism, unspecified: Secondary | ICD-10-CM

## 2020-06-09 ENCOUNTER — Other Ambulatory Visit: Payer: Self-pay | Admitting: Interventional Cardiology

## 2020-06-09 ENCOUNTER — Other Ambulatory Visit: Payer: Self-pay | Admitting: Internal Medicine

## 2020-06-09 DIAGNOSIS — E039 Hypothyroidism, unspecified: Secondary | ICD-10-CM

## 2020-07-09 ENCOUNTER — Other Ambulatory Visit: Payer: Self-pay | Admitting: Interventional Cardiology

## 2020-07-17 ENCOUNTER — Other Ambulatory Visit: Payer: Self-pay | Admitting: Interventional Cardiology

## 2020-07-19 ENCOUNTER — Other Ambulatory Visit: Payer: Self-pay

## 2020-10-11 ENCOUNTER — Other Ambulatory Visit: Payer: Self-pay | Admitting: Internal Medicine

## 2020-10-11 DIAGNOSIS — E039 Hypothyroidism, unspecified: Secondary | ICD-10-CM

## 2020-12-05 ENCOUNTER — Other Ambulatory Visit: Payer: Self-pay | Admitting: Interventional Cardiology

## 2020-12-05 NOTE — Telephone Encounter (Signed)
Pt moved to [place] last year, no longer following with [facility].

## 2021-01-12 ENCOUNTER — Other Ambulatory Visit: Payer: Self-pay | Admitting: Internal Medicine

## 2021-01-12 DIAGNOSIS — E039 Hypothyroidism, unspecified: Secondary | ICD-10-CM

## 2021-01-13 ENCOUNTER — Other Ambulatory Visit: Payer: Self-pay | Admitting: Interventional Cardiology

## 2023-03-21 ENCOUNTER — Telehealth: Payer: Self-pay

## 2023-03-21 NOTE — Telephone Encounter (Signed)
Pt is currently living in [place] but is coming to see family in October and would like to see you then. He reported that he has not seen a urologist since 2021.   Please advise.

## 2023-06-09 ENCOUNTER — Telehealth: Payer: Self-pay | Admitting: Urology

## 2023-06-09 NOTE — Telephone Encounter (Signed)
Patient called to schedule but every phone number I dial is a busy signal including the one he left for me in the voicemail. Will try calling again later.  [phone]

## 2023-06-12 ENCOUNTER — Ambulatory Visit: Payer: Self-pay | Admitting: Urology

## 2023-06-12 ENCOUNTER — Ambulatory Visit (INDEPENDENT_AMBULATORY_CARE_PROVIDER_SITE_OTHER): Payer: Medicare FFS | Admitting: Urology

## 2023-06-12 VITALS — BP 126/63 | HR 76 | Ht 63.0 in | Wt 150.0 lb

## 2023-06-12 DIAGNOSIS — N401 Enlarged prostate with lower urinary tract symptoms: Secondary | ICD-10-CM

## 2023-06-12 DIAGNOSIS — C679 Malignant neoplasm of bladder, unspecified: Secondary | ICD-10-CM

## 2023-06-12 DIAGNOSIS — N3289 Other specified disorders of bladder: Secondary | ICD-10-CM

## 2023-06-12 NOTE — H&P (View-Only) (Signed)
Assessment: 1. Malignant neoplasm of urinary bladder, unspecified site (HCC)   2. Benign localized prostatic hyperplasia with lower urinary tract symptoms (LUTS)      Plan: Patient will continue combination medical therapy for his BPH  Today I discussed with him the finding of recurrent bladder cancer I have recommended proceeding with cystoscopy under anesthesia with bladder biopsy and TURBT with perioperative chemotherapy.  Patient has had this performed a number of times and is quite familiar with the procedure.  He is expresses understanding and agrees to proceed.  Unfortunately he is heading back to [place] next week so we will have to wait until he returns likely sometime in November.  Chief Complaint: bladder cancer/ BPH  History of Present Illness:  Stephen Hendrix is a 89 y.o. male who is seen today for evaluation of low risk nonmuscle invasive bladder cancer and BPH on combination medical therapy. Patient last seen by me 01/2020.  He lives in [place] but returns to the [place] area frequently to see family.  He has not had cystoscopic follow-up since his last visit in 2021.  He denies any hematuria or other complaints.  He reports stable lower urinary tract symptoms.  Current IPSS = 3. He is taking Avodart plus tamsulosin.  In addition, patient has a history of recurrent low-grade low risk noninvasive bladder cancer.   Past Medical History:  Past Medical History:  Diagnosis Date   Atrial fibrillation (HCC)    Cancer (HCC)    Hypertension    Pneumonia    TIA (transient ischemic attack)    Whooping cough     Past Surgical History:  Past Surgical History:  Procedure Laterality Date   EYE SURGERY     TONSILLECTOMY      Allergies:  Allergies  Allergen Reactions   Corticosteroids Shortness Of Breath    HICCUPS AND RESPIRATORY DISTRESS   Methylprednisolone Shortness Of Breath    Pt tolerates po steroids   Other Anaphylaxis    IV steroid    Penicillins Rash and Other (See Comments)    Did it involve swelling of the face/tongue/throat, SOB, or low BP? Skin Rash on Chest Did it involve sudden or severe rash/hives, skin peeling, or any reaction on the inside of your mouth or nose? No Did you need to seek medical attention at a hospital or doctor's office? No. When did it last happen? Decades ago.      If all above answers are "NO", may proceed with cephalosporin use.    Avocado     Other reaction(s): Other (See Comments) ORAL PEELING   Macrobid [Nitrofurantoin]     Rash    Cortisone     Hiccups for a week Hiccups for a week Hiccups for a week    Family History:  Family History  Problem Relation Age of Onset   CAD Father    Colon cancer Other     Social History:  Social History   Tobacco Use   Smoking status: Never   Smokeless tobacco: Never  Vaping Use   Vaping status: Never Used  Substance Use Topics   Alcohol use: Yes    Alcohol/week: 2.0 standard drinks of alcohol    Types: 2 Glasses of wine per week   Drug use: No    Review of symptoms:  Constitutional:  Negative for unexplained weight loss, night sweats, fever, chills ENT:  Negative for nose bleeds, sinus pain, painful swallowing CV:  Negative for chest pain, shortness of breath,  exercise intolerance, palpitations, loss of consciousness Resp:  Negative for cough, wheezing, shortness of breath GI:  Negative for nausea, vomiting, diarrhea, bloody stools GU:  Positives noted in HPI; otherwise negative for gross hematuria, dysuria, urinary incontinence Neuro:  Negative for seizures, poor balance, limb weakness, slurred speech Psych:  Negative for lack of energy, depression, anxiety Endocrine:  Negative for polydipsia, polyuria, symptoms of hypoglycemia (dizziness, hunger, sweating) Hematologic:  Negative for anemia, purpura, petechia, prolonged or excessive bleeding, use of anticoagulants  Allergic:  Negative for difficulty breathing or choking as a  result of exposure to anything; no shellfish allergy; no allergic response (rash/itch) to materials, foods  Physical exam: BP 126/63   Pulse 76   Ht 5\' 3"  (1.6 m)   Wt 150 lb (68 kg)   BMI 26.57 kg/m  GENERAL APPEARANCE:  Well appearing, well developed, well nourished, NAD HEENT: Atraumatic, Normocephalic LUNGS: Clear to auscultation bilaterally. HEART: Regular Rate and Rhythm  ABDOMEN: Soft, non-tender, No Masses. EXTREMITIES: Moves all extremities well.  Without clubbing, cyanosis, or edema. NEUROLOGIC:  Alert and oriented x 3, normal gait MENTAL STATUS:  Appropriate. SKIN:  Warm, dry and intact.    PROCEDURE:  MALE CYSTOSCOPY  INDICATION:  BLADDER CANCER  DESCRIPTION OF PROCEDURE: The patient was brought to the cystoscopy suite where the procedure was again reviewed with him and informed consent was obtained.  Preprocedural timeout was performed.  Flexible cystoscopy was subsequently performed.  Anterior urethra was normal.  Prostatic urethra revealed lateral lobe enlargement.  Bladder was subsequently entered and carefully inspected.  There were noted to be 2 areas on the right lateral wall with papillary growth most consistent with low-grade TCC.  The scope was subsequently removed and the procedure terminated.  Patient tolerated the procedure well.  No apparent complications.

## 2023-06-12 NOTE — Progress Notes (Signed)
Assessment: 1. Malignant neoplasm of urinary bladder, unspecified site (HCC)   2. Benign localized prostatic hyperplasia with lower urinary tract symptoms (LUTS)      Plan: Patient will continue combination medical therapy for his BPH  Today I discussed with him the finding of recurrent bladder cancer I have recommended proceeding with cystoscopy under anesthesia with bladder biopsy and TURBT with perioperative chemotherapy.  Patient has had this performed a number of times and is quite familiar with the procedure.  He is expresses understanding and agrees to proceed.  Unfortunately he is heading back to [place] next week so we will have to wait until he returns likely sometime in November.  Chief Complaint: bladder cancer/ BPH  History of Present Illness:  Stephen Hendrix is a 89 y.o. male who is seen today for evaluation of low risk nonmuscle invasive bladder cancer and BPH on combination medical therapy. Patient last seen by me 01/2020.  He lives in [place] but returns to the [place] area frequently to see family.  He has not had cystoscopic follow-up since his last visit in 2021.  He denies any hematuria or other complaints.  He reports stable lower urinary tract symptoms.  Current IPSS = 3. He is taking Avodart plus tamsulosin.  In addition, patient has a history of recurrent low-grade low risk noninvasive bladder cancer.   Past Medical History:  Past Medical History:  Diagnosis Date   Atrial fibrillation (HCC)    Cancer (HCC)    Hypertension    Pneumonia    TIA (transient ischemic attack)    Whooping cough     Past Surgical History:  Past Surgical History:  Procedure Laterality Date   EYE SURGERY     TONSILLECTOMY      Allergies:  Allergies  Allergen Reactions   Corticosteroids Shortness Of Breath    HICCUPS AND RESPIRATORY DISTRESS   Methylprednisolone Shortness Of Breath    Pt tolerates po steroids   Other Anaphylaxis    IV steroid    Penicillins Rash and Other (See Comments)    Did it involve swelling of the face/tongue/throat, SOB, or low BP? Skin Rash on Chest Did it involve sudden or severe rash/hives, skin peeling, or any reaction on the inside of your mouth or nose? No Did you need to seek medical attention at a hospital or doctor's office? No. When did it last happen? Decades ago.      If all above answers are "NO", may proceed with cephalosporin use.    Avocado     Other reaction(s): Other (See Comments) ORAL PEELING   Macrobid [Nitrofurantoin]     Rash    Cortisone     Hiccups for a week Hiccups for a week Hiccups for a week    Family History:  Family History  Problem Relation Age of Onset   CAD Father    Colon cancer Other     Social History:  Social History   Tobacco Use   Smoking status: Never   Smokeless tobacco: Never  Vaping Use   Vaping status: Never Used  Substance Use Topics   Alcohol use: Yes    Alcohol/week: 2.0 standard drinks of alcohol    Types: 2 Glasses of wine per week   Drug use: No    Review of symptoms:  Constitutional:  Negative for unexplained weight loss, night sweats, fever, chills ENT:  Negative for nose bleeds, sinus pain, painful swallowing CV:  Negative for chest pain, shortness of breath,  exercise intolerance, palpitations, loss of consciousness Resp:  Negative for cough, wheezing, shortness of breath GI:  Negative for nausea, vomiting, diarrhea, bloody stools GU:  Positives noted in HPI; otherwise negative for gross hematuria, dysuria, urinary incontinence Neuro:  Negative for seizures, poor balance, limb weakness, slurred speech Psych:  Negative for lack of energy, depression, anxiety Endocrine:  Negative for polydipsia, polyuria, symptoms of hypoglycemia (dizziness, hunger, sweating) Hematologic:  Negative for anemia, purpura, petechia, prolonged or excessive bleeding, use of anticoagulants  Allergic:  Negative for difficulty breathing or choking as a  result of exposure to anything; no shellfish allergy; no allergic response (rash/itch) to materials, foods  Physical exam: BP 126/63   Pulse 76   Ht 5\' 3"  (1.6 m)   Wt 150 lb (68 kg)   BMI 26.57 kg/m  GENERAL APPEARANCE:  Well appearing, well developed, well nourished, NAD HEENT: Atraumatic, Normocephalic LUNGS: Clear to auscultation bilaterally. HEART: Regular Rate and Rhythm  ABDOMEN: Soft, non-tender, No Masses. EXTREMITIES: Moves all extremities well.  Without clubbing, cyanosis, or edema. NEUROLOGIC:  Alert and oriented x 3, normal gait MENTAL STATUS:  Appropriate. SKIN:  Warm, dry and intact.    PROCEDURE:  MALE CYSTOSCOPY  INDICATION:  BLADDER CANCER  DESCRIPTION OF PROCEDURE: The patient was brought to the cystoscopy suite where the procedure was again reviewed with him and informed consent was obtained.  Preprocedural timeout was performed.  Flexible cystoscopy was subsequently performed.  Anterior urethra was normal.  Prostatic urethra revealed lateral lobe enlargement.  Bladder was subsequently entered and carefully inspected.  There were noted to be 2 areas on the right lateral wall with papillary growth most consistent with low-grade TCC.  The scope was subsequently removed and the procedure terminated.  Patient tolerated the procedure well.  No apparent complications.

## 2023-06-18 LAB — URINALYSIS, ROUTINE W REFLEX MICROSCOPIC
Bilirubin, UA: NEGATIVE
Glucose, UA: NEGATIVE
Leukocytes,UA: NEGATIVE
Nitrite, UA: NEGATIVE
Protein,UA: NEGATIVE
RBC, UA: NEGATIVE
Specific Gravity, UA: 1.025 (ref 1.005–1.030)
Urobilinogen, Ur: 0.2 mg/dL (ref 0.2–1.0)
pH, UA: 5.5 (ref 5.0–7.5)

## 2023-06-19 ENCOUNTER — Encounter (HOSPITAL_COMMUNITY): Payer: Self-pay

## 2023-06-19 NOTE — Patient Instructions (Addendum)
SURGICAL WAITING ROOM VISITATION Patients having surgery or a procedure may have no more than 2 support people in the waiting area - these visitors may rotate.    Children under the age of 72 must have an adult with them who is not the patient.  If the patient needs to stay at the hospital during part of their recovery, the visitor guidelines for inpatient rooms apply. Pre-op nurse will coordinate an appropriate time for 1 support person to accompany patient in pre-op.  This support person may not rotate.    Please refer to the [facility] website for the visitor guidelines for Inpatients (after your surgery is over and you are in a regular room).       Your procedure is scheduled on: 07-01-23   Report to [facility] Main Entrance    Report to admitting at 10:45 AM   Call this number if you have problems the morning of surgery [phone]   Do not eat food or drink liquids :After Midnight.           If you have questions, please contact your surgeon's office.   FOLLOW ANY ADDITIONAL PRE OP INSTRUCTIONS YOU RECEIVED FROM YOUR SURGEON'S OFFICE!!!     Oral Hygiene is also important to reduce your risk of infection.                                    Remember - BRUSH YOUR TEETH THE MORNING OF SURGERY WITH YOUR REGULAR TOOTHPASTE   Do NOT smoke after Midnight   Take these medicines the morning of surgery with A SIP OF WATER:   Amlodipine  Levothyroxine  Tylenol if needed  Stop all vitamins and herbal supplements 7 days before surgery                             You may not have any metal on your body including  jewelry, and body piercing             Do not wear  lotions, powders, cologne, or deodorant              Men may shave face and neck.   Do not bring valuables to the hospital. [facility] IS NOT RESPONSIBLE   FOR VALUABLES.   Contacts, dentures or bridgework may not be worn into surgery.  DO NOT BRING YOUR HOME MEDICATIONS TO THE HOSPITAL. PHARMACY WILL  DISPENSE MEDICATIONS LISTED ON YOUR MEDICATION LIST TO YOU DURING YOUR ADMISSION IN THE HOSPITAL!    Patients discharged on the day of surgery will not be allowed to drive home.  Someone NEEDS to stay with you for the first 24 hours after anesthesia.              Please read over the following fact sheets you were given: IF YOU HAVE QUESTIONS ABOUT YOUR PRE-OP INSTRUCTIONS PLEASE CALL [phone] [facility]  If you received a COVID test during your pre-op visit  it is requested that you wear a mask when out in public, stay away from anyone that may not be feeling well and notify your surgeon if you develop symptoms. If you test positive for Covid or have been in contact with anyone that has tested positive in the last 10 days please notify you surgeon.  [facility] - Preparing for Surgery Before surgery, you can play an important  role.  Because skin is not sterile, your skin needs to be as free of germs as possible.  You can reduce the number of germs on your skin by washing with CHG (chlorahexidine gluconate) soap before surgery.  CHG is an antiseptic cleaner which kills germs and bonds with the skin to continue killing germs even after washing. Please DO NOT use if you have an allergy to CHG or antibacterial soaps.  If your skin becomes reddened/irritated stop using the CHG and inform your nurse when you arrive at Short Stay. Do not shave (including legs and underarms) for at least 48 hours prior to the first CHG shower.  You may shave your face/neck.  Please follow these instructions carefully:  1.  Shower with CHG Soap the night before surgery and the  morning of surgery.  2.  If you choose to wash your hair, wash your hair first as usual with your normal  shampoo.  3.  After you shampoo, rinse your hair and body thoroughly to remove the shampoo.                             4.  Use CHG as you would any other liquid soap.  You can apply chg directly to the skin and wash.  Gently with a scrungie or  clean washcloth.  5.  Apply the CHG Soap to your body ONLY FROM THE NECK DOWN.   Do   not use on face/ open                           Wound or open sores. Avoid contact with eyes, ears mouth and   genitals (private parts).                       Wash face,  Genitals (private parts) with your normal soap.             6.  Wash thoroughly, paying special attention to the area where your    surgery  will be performed.  7.  Thoroughly rinse your body with warm water from the neck down.  8.  DO NOT shower/wash with your normal soap after using and rinsing off the CHG Soap.                9.  Pat yourself dry with a clean towel.            10.  Wear clean pajamas.            11.  Place clean sheets on your bed the night of your first shower and do not  sleep with pets. Day of Surgery : Do not apply any lotions/deodorants the morning of surgery.  Please wear clean clothes to the hospital/surgery center.  FAILURE TO FOLLOW THESE INSTRUCTIONS MAY RESULT IN THE CANCELLATION OF YOUR SURGERY  PATIENT SIGNATURE_________________________________  NURSE SIGNATURE__________________________________  ________________________________________________________________________

## 2023-06-24 ENCOUNTER — Encounter: Payer: Self-pay | Admitting: Urology

## 2023-06-24 NOTE — Progress Notes (Addendum)
COVID Vaccine Completed:  Date of COVID positive in last 90 days:  No  PCP - No PCP Cardiologist - Lance Muss, MD (last OV 2020).  Sees cardiology in [place]  Patient resides in [place] most of the time.    Chest x-ray - N/A EKG - 06-27-23 Epic Stress Test - 04-27-19 Epic ECHO - 12-02-17 Epic Cardiac Cath - N/A Pacemaker/ICD device last checked: Spinal Cord Stimulator:N/A Telemetry Monitor - 10-21-18 Epic  Bowel Prep - N/A  Sleep Study - N/A CPAP -   Fasting Blood Sugar - N/A Checks Blood Sugar _____ times a day  Last dose of GLP1 agonist-  N/A GLP1 instructions:  N/A   Last dose of SGLT-2 inhibitors-  N/A SGLT-2 instructions: N/A  Blood Thinner Instructions:  Eliquis. Last dose 06-26-23 at 10 PM  Aspirin Instructions: N/A Last Dose:  Activity level:  Can go up a flight of stairs and perform activities of daily living without stopping and without symptoms of chest pain or shortness of breath.  Anesthesia review: Afib.  Patient states that he has a small airway and vocal cord was damaged during a surgery  Patient denies shortness of breath, fever, cough and chest pain at PAT appointment  Patient verbalized understanding of instructions that were given to them at the PAT appointment. Patient was also instructed that they will need to review over the PAT instructions again at home before surgery.

## 2023-06-27 ENCOUNTER — Other Ambulatory Visit: Payer: Self-pay

## 2023-06-27 ENCOUNTER — Encounter (HOSPITAL_COMMUNITY): Payer: Self-pay

## 2023-06-27 ENCOUNTER — Encounter (HOSPITAL_COMMUNITY)
Admission: RE | Admit: 2023-06-27 | Discharge: 2023-06-27 | Disposition: A | Discharge status: Home / Self Care | Payer: Medicare FFS | Source: Ambulatory Visit | Attending: Urology | Admitting: Urology

## 2023-06-27 VITALS — BP 159/89 | HR 60 | Temp 98.2°F | Resp 16 | Ht 63.0 in | Wt 152.2 lb

## 2023-06-27 DIAGNOSIS — D649 Anemia, unspecified: Secondary | ICD-10-CM | POA: Insufficient documentation

## 2023-06-27 DIAGNOSIS — Z01818 Encounter for other preprocedural examination: Secondary | ICD-10-CM | POA: Insufficient documentation

## 2023-06-27 DIAGNOSIS — I10 Essential (primary) hypertension: Secondary | ICD-10-CM | POA: Diagnosis not present

## 2023-06-27 DIAGNOSIS — Z8673 Personal history of transient ischemic attack (TIA), and cerebral infarction without residual deficits: Secondary | ICD-10-CM | POA: Diagnosis not present

## 2023-06-27 DIAGNOSIS — I4891 Unspecified atrial fibrillation: Secondary | ICD-10-CM | POA: Diagnosis not present

## 2023-06-27 DIAGNOSIS — I2510 Atherosclerotic heart disease of native coronary artery without angina pectoris: Secondary | ICD-10-CM | POA: Insufficient documentation

## 2023-06-27 DIAGNOSIS — D494 Neoplasm of unspecified behavior of bladder: Secondary | ICD-10-CM | POA: Insufficient documentation

## 2023-06-27 HISTORY — DX: Other complications of anesthesia, initial encounter: T88.59XA

## 2023-06-27 HISTORY — DX: Anemia, unspecified: D64.9

## 2023-06-27 LAB — BASIC METABOLIC PANEL
Anion gap: 10 (ref 5–15)
BUN: 17 mg/dL (ref 8–23)
CO2: 27 mmol/L (ref 22–32)
Calcium: 9.3 mg/dL (ref 8.9–10.3)
Chloride: 104 mmol/L (ref 98–111)
Creatinine, Ser: 0.8 mg/dL (ref 0.61–1.24)
GFR, Estimated: >60 mL/min (ref >60)
Glucose, Bld: 123 mg/dL — ABNORMAL HIGH (ref 70–99)
Potassium: 3.8 mmol/L (ref 3.5–5.1)
Sodium: 141 mmol/L (ref 135–145)

## 2023-06-27 LAB — CBC
HCT: 47.7 % (ref 39.0–52.0)
Hemoglobin: 15.5 g/dL (ref 13.0–17.0)
MCH: 30.2 pg (ref 26.0–34.0)
MCHC: 32.5 g/dL (ref 30.0–36.0)
MCV: 93 fL (ref 80.0–100.0)
Platelets: 215 10*3/uL (ref 150–400)
RBC: 5.13 MIL/uL (ref 4.22–5.81)
RDW: 13.9 % (ref 11.5–15.5)
WBC: 9 10*3/uL (ref 4.0–10.5)
nRBC: 0 % (ref 0.0–0.2)

## 2023-06-30 NOTE — Anesthesia Preprocedure Evaluation (Signed)
Anesthesia Evaluation  Patient identified by MRN, date of birth, ID band Patient awake    Reviewed: Allergy & Precautions, NPO status , Patient's Chart, lab work & pertinent test results  Airway Mallampati: II  TM Distance: <3 FB Neck ROM: Limited    Dental  (+) Teeth Intact, Dental Advisory Given   Pulmonary    breath sounds clear to auscultation       Cardiovascular hypertension, Pt. on medications + dysrhythmias Atrial Fibrillation  Rhythm:Regular Rate:Normal  Echo:  Nuclear stress EF: 67%.  There was no ST segment deviation noted during stress.  The left ventricular ejection fraction is normal (55-65%).    Neuro/Psych  Headaches TIA negative psych ROS   GI/Hepatic negative GI ROS, Neg liver ROS,,,  Endo/Other  Hypothyroidism    Renal/GU Renal disease     Musculoskeletal negative musculoskeletal ROS (+)    Abdominal   Peds  Hematology  (+) Blood dyscrasia, anemia   Anesthesia Other Findings   Reproductive/Obstetrics                             Anesthesia Physical Anesthesia Plan  ASA: 3  Anesthesia Plan: General   Post-op Pain Management: Tylenol PO (pre-op)*   Induction: Intravenous  PONV Risk Score and Plan: 3 and Ondansetron and Treatment may vary due to age or medical condition  Airway Management Planned: Oral ETT, LMA and Video Laryngoscope Planned  Additional Equipment: None  Intra-op Plan:   Post-operative Plan: Extubation in OR  Informed Consent: I have reviewed the patients History and Physical, chart, labs and discussed the procedure including the risks, benefits and alternatives for the proposed anesthesia with the patient or authorized representative who has indicated his/her understanding and acceptance.     Dental advisory given  Plan Discussed with: CRNA  Anesthesia Plan Comments: (See PAT note 06/27/2023)       Anesthesia Quick Evaluation

## 2023-06-30 NOTE — Progress Notes (Signed)
Anesthesia Chart Review   Case: [id number] Date/Time: 07/01/23 1245   Procedures:      TRANSURETHRAL RESECTION OF BLADDER TUMOR (TURBT) with GEMCITABINE (Bilateral)     CYSTOSCOPY WITH RETROGRADE PYELOGRAM (Bilateral)   Anesthesia type: General   Pre-op diagnosis: bladder tumor   Location: [facility] PROCEDURE [facility] / [facility]   Surgeons: [name], MD       DISCUSSION:89 y.o. never smoker with h/o HTN, TIA, atrial fibrillation, bladder tumor scheduled for above procedure 07/01/2023 with Dr. Marcha Solders.   Pt follows with cardiology in [place] for HTN and atrial fibrillation. Last echo 2019 with EF 65%, mild aortic regurgitation.  Pt reports he was advised to hold Eliquis, last dose 06/26/2023. Pt reports he can climb a flight of stairs without sx.   Per previous anesthesia notes pt with difficult airway, limited mouth opening, limited neck ROM.   Per 11/05/2016 anesthesia note, ASA 3, IV induction, LMA used.  Per 09/20/2016 anesthesia note, LMA used, size 4.   VS: BP (!) 159/89   Pulse 60   Temp 36.8 C (Oral)   Resp 16   Ht 5\' 3"  (1.6 m)   Wt 69 kg   SpO2 99%   BMI 26.96 kg/m   PROVIDERS: Patient, No Pcp Per   LABS: Labs reviewed: Acceptable for surgery. (all labs ordered are listed, but only abnormal results are displayed)  Labs Reviewed  BASIC METABOLIC PANEL - Abnormal; Notable for the following components:      Result Value   Glucose, Bld 123 (*)    All other components within normal limits  CBC     IMAGES:   EKG:   CV: Myocardial perfusion 04/27/2019 Nuclear stress EF: 67%. There was no ST segment deviation noted during stress. The left ventricular ejection fraction is normal (55-65%).  Echo 12/02/2017 Normal chambers, dimensions and contractility; EF 65% Normal valves motion and appearance.  Mild aortic regurgitation Very trivial mitral regurgitation Other valvular flows are adequate for age No pericardial effusion No evidence of mitral valve  prolapse Mild left ventricular diastolic dysfunction No pulmonary hypertension detected.   Echo 06/03/2013 Study Conclusions   - Left ventricle: The cavity size was normal. There was mild    concentric hypertrophy. Systolic function was vigorous.    The estimated ejection fraction was in the range of 65% to    70%. Wall motion was normal; there were no regional wall    motion abnormalities. Doppler parameters are consistent    with abnormal left ventricular relaxation (grade 1    diastolic dysfunction).  - Aortic valve: Mild regurgitation.  Past Medical History:  Diagnosis Date   Anemia    Atrial fibrillation (HCC)    Cancer (HCC)    Complication of anesthesia    Small airway, vocal cord damaged   Hypertension    Pneumonia    TIA (transient ischemic attack)    Whooping cough     Past Surgical History:  Procedure Laterality Date   BLADDER SURGERY     CHOLECYSTECTOMY     EYE SURGERY     HERNIA REPAIR     TONSILLECTOMY      MEDICATIONS:  acetaminophen (TYLENOL) 650 MG CR tablet   amLODipine (NORVASC) 2.5 MG tablet   apixaban (ELIQUIS) 2.5 MG TABS tablet   Ascorbic Acid (VITAMIN C) 1000 MG tablet   atorvastatin (LIPITOR) 40 MG tablet   candesartan (ATACAND) 4 MG tablet   Dutasteride-Tamsulosin HCl 0.5-0.4 MG CAPS   eplerenone (INSPRA)  25 MG tablet   fluticasone (FLONASE) 50 MCG/ACT nasal spray   gabapentin (NEURONTIN) 300 MG capsule   levothyroxine (SYNTHROID) 50 MCG tablet   Multiple Vitamins-Minerals (MULTIVITAMIN GUMMIES ADULT) CHEW   OVER THE COUNTER MEDICATION   OVER THE COUNTER MEDICATION   vitamin B-12 (CYANOCOBALAMIN) 1000 MCG tablet   No current facility-administered medications for this encounter.    regadenoson (LEXISCAN) injection SOLN 0.4 mg   technetium tetrofosmin (TC-MYOVIEW) injection 11 millicurie   technetium tetrofosmin (TC-MYOVIEW) injection 32.7 millicurie    [facility], PA-C [facility] Pre-Surgical Testing [phone]

## 2023-07-01 ENCOUNTER — Ambulatory Visit (HOSPITAL_COMMUNITY): Payer: Medicare FFS | Admitting: Physician Assistant

## 2023-07-01 ENCOUNTER — Encounter (HOSPITAL_COMMUNITY): Payer: Self-pay | Admitting: Urology

## 2023-07-01 ENCOUNTER — Ambulatory Visit (HOSPITAL_COMMUNITY)
Admission: RE | Admit: 2023-07-01 | Discharge: 2023-07-01 | Disposition: A | Discharge status: Home / Self Care | Payer: Medicare FFS | Attending: Urology | Admitting: Urology

## 2023-07-01 ENCOUNTER — Ambulatory Visit (HOSPITAL_BASED_OUTPATIENT_CLINIC_OR_DEPARTMENT_OTHER): Payer: Medicare FFS | Admitting: Anesthesiology

## 2023-07-01 ENCOUNTER — Ambulatory Visit (HOSPITAL_COMMUNITY): Payer: Medicare FFS

## 2023-07-01 ENCOUNTER — Encounter (HOSPITAL_COMMUNITY)
Admission: RE | Disposition: A | Discharge status: Home / Self Care | Payer: Self-pay | Source: Home / Self Care | Attending: Urology

## 2023-07-01 ENCOUNTER — Other Ambulatory Visit: Payer: Self-pay

## 2023-07-01 DIAGNOSIS — I10 Essential (primary) hypertension: Secondary | ICD-10-CM | POA: Diagnosis not present

## 2023-07-01 DIAGNOSIS — Z8673 Personal history of transient ischemic attack (TIA), and cerebral infarction without residual deficits: Secondary | ICD-10-CM | POA: Diagnosis not present

## 2023-07-01 DIAGNOSIS — C676 Malignant neoplasm of ureteric orifice: Secondary | ICD-10-CM | POA: Diagnosis not present

## 2023-07-01 DIAGNOSIS — D494 Neoplasm of unspecified behavior of bladder: Secondary | ICD-10-CM

## 2023-07-01 DIAGNOSIS — I4891 Unspecified atrial fibrillation: Secondary | ICD-10-CM | POA: Diagnosis not present

## 2023-07-01 DIAGNOSIS — N400 Enlarged prostate without lower urinary tract symptoms: Secondary | ICD-10-CM | POA: Insufficient documentation

## 2023-07-01 DIAGNOSIS — C679 Malignant neoplasm of bladder, unspecified: Secondary | ICD-10-CM | POA: Insufficient documentation

## 2023-07-01 DIAGNOSIS — Z2989 Encounter for other specified prophylactic measures: Secondary | ICD-10-CM

## 2023-07-01 HISTORY — PX: CYSTOSCOPY W/ RETROGRADES: SHX1426

## 2023-07-01 SURGERY — TURBT, WITH CHEMOTHERAPEUTIC AGENT INSTILLATION INTO BLADDER
Anesthesia: General | Site: Ureter | Laterality: Bilateral

## 2023-07-01 MED ORDER — SUGAMMADEX SODIUM 200 MG/2ML IV SOLN
INTRAVENOUS | Status: DC | PRN
  Administered 2023-07-01: 200 mg via INTRAVENOUS

## 2023-07-01 MED ORDER — ONDANSETRON HCL 4 MG/2ML IJ SOLN
INTRAMUSCULAR | Status: AC
Start: 2023-07-01 — End: ?
  Filled 2023-07-01: qty 2

## 2023-07-01 MED ORDER — LIDOCAINE HCL (CARDIAC) PF 100 MG/5ML IV SOSY
PREFILLED_SYRINGE | INTRAVENOUS | Status: DC | PRN
  Administered 2023-07-01: 40 mg via INTRAVENOUS

## 2023-07-01 MED ORDER — CHLORHEXIDINE GLUCONATE 0.12 % MT SOLN
15.0000 mL | Freq: Once | OROMUCOSAL | Status: AC
  Administered 2023-07-01: 15 mL via OROMUCOSAL

## 2023-07-01 MED ORDER — PROPOFOL 10 MG/ML IV BOLUS
INTRAVENOUS | Status: DC | PRN
  Administered 2023-07-01: 140 mg via INTRAVENOUS

## 2023-07-01 MED ORDER — PROPOFOL 10 MG/ML IV BOLUS
INTRAVENOUS | Status: AC
  Filled 2023-07-01: qty 20

## 2023-07-01 MED ORDER — SODIUM CHLORIDE 0.9 % IR SOLN
Status: DC | PRN
  Administered 2023-07-01: 3000 mL

## 2023-07-01 MED ORDER — ORAL CARE MOUTH RINSE: 15.0000 mL | Freq: Once | OROMUCOSAL | Status: AC

## 2023-07-01 MED ORDER — PHENYLEPHRINE HCL (PRESSORS) 10 MG/ML IV SOLN
INTRAVENOUS | Status: DC | PRN
  Administered 2023-07-01: 160 ug via INTRAVENOUS
  Administered 2023-07-01: 80 ug via INTRAVENOUS

## 2023-07-01 MED ORDER — FENTANYL CITRATE PF 50 MCG/ML IJ SOSY: 25.0000 ug | PREFILLED_SYRINGE | INTRAMUSCULAR | Status: DC | PRN

## 2023-07-01 MED ORDER — DEXAMETHASONE SODIUM PHOSPHATE 10 MG/ML IJ SOLN
INTRAMUSCULAR | Status: AC
  Filled 2023-07-01: qty 1

## 2023-07-01 MED ORDER — ONDANSETRON HCL 4 MG/2ML IJ SOLN
INTRAMUSCULAR | Status: DC | PRN
  Administered 2023-07-01: 4 mg via INTRAVENOUS

## 2023-07-01 MED ORDER — IOHEXOL 300 MG/ML  SOLN
INTRAMUSCULAR | Status: DC | PRN
  Administered 2023-07-01: 13 mL

## 2023-07-01 MED ORDER — GEMCITABINE CHEMO FOR BLADDER INSTILLATION 2000 MG
2000.0000 mg | Freq: Once | INTRAVENOUS | Status: AC
  Administered 2023-07-01: 2000 mg via INTRAVESICAL
  Filled 2023-07-01: qty 2000

## 2023-07-01 MED ORDER — DEXAMETHASONE SODIUM PHOSPHATE 10 MG/ML IJ SOLN
INTRAMUSCULAR | Status: DC | PRN
  Administered 2023-07-01: 5 mg via INTRAVENOUS

## 2023-07-01 MED ORDER — SODIUM CHLORIDE 0.9 % IV SOLN
2.0000 g | INTRAVENOUS | Status: AC
  Administered 2023-07-01: 2 g via INTRAVENOUS
  Filled 2023-07-01: qty 20

## 2023-07-01 MED ORDER — ROCURONIUM BROMIDE 100 MG/10ML IV SOLN
INTRAVENOUS | Status: DC | PRN
  Administered 2023-07-01: 40 mg via INTRAVENOUS

## 2023-07-01 MED ORDER — LACTATED RINGERS IV SOLN: INTRAVENOUS | Status: DC

## 2023-07-01 MED ORDER — LIDOCAINE HCL (PF) 2 % IJ SOLN
INTRAMUSCULAR | Status: AC
  Filled 2023-07-01: qty 5

## 2023-07-01 MED ORDER — PHENYLEPHRINE 80 MCG/ML (10ML) SYRINGE FOR IV PUSH (FOR BLOOD PRESSURE SUPPORT)
PREFILLED_SYRINGE | INTRAVENOUS | Status: AC
  Filled 2023-07-01: qty 10

## 2023-07-01 SURGICAL SUPPLY — 25 items
BAG DRN RND TRDRP ANRFLXCHMBR (UROLOGICAL SUPPLIES)
BAG URINE DRAIN 2000ML AR STRL (UROLOGICAL SUPPLIES) IMPLANT
BAG URO CATCHER STRL LF (MISCELLANEOUS) ×2 IMPLANT
CATH FOLEY 2WAY SLVR 5CC 20FR (CATHETERS) IMPLANT
CATH URETL OPEN END 6FR 70 (CATHETERS) IMPLANT
CLOTH BEACON ORANGE TIMEOUT ST (SAFETY) ×2 IMPLANT
DRAPE FOOT SWITCH (DRAPES) ×2 IMPLANT
ELECT REM PT RETURN 15FT ADLT (MISCELLANEOUS) ×2 IMPLANT
EVACUATOR MICROVAS BLADDER (UROLOGICAL SUPPLIES) IMPLANT
GLOVE BIO SURGEON STRL SZ8.5 (GLOVE) ×2 IMPLANT
GLOVE SURG LX STRL 7.5 STRW (GLOVE) ×2 IMPLANT
GOWN STRL REUS W/ TWL XL LVL3 (GOWN DISPOSABLE) ×2 IMPLANT
GOWN STRL REUS W/TWL XL LVL3 (GOWN DISPOSABLE) ×2
GUIDEWIRE STR DUAL SENSOR (WIRE) ×2 IMPLANT
KIT TURNOVER KIT A (KITS) IMPLANT
LOOP CUT BIPOLAR 24F LRG (ELECTROSURGICAL) IMPLANT
MANIFOLD NEPTUNE II (INSTRUMENTS) ×2 IMPLANT
NS IRRIG 1000ML POUR BTL (IV SOLUTION) IMPLANT
PACK CYSTO (CUSTOM PROCEDURE TRAY) ×2 IMPLANT
PAD PREP 24X48 CUFFED NSTRL (MISCELLANEOUS) ×2 IMPLANT
PLUG CATH AND CAP STRL 200 (CATHETERS) ×2 IMPLANT
SYR TOOMEY IRRIG 70ML (MISCELLANEOUS) ×2
SYRINGE TOOMEY IRRIG 70ML (MISCELLANEOUS) IMPLANT
TUBING CONNECTING 10 (TUBING) ×2 IMPLANT
TUBING UROLOGY SET (TUBING) ×2 IMPLANT

## 2023-07-01 NOTE — Anesthesia Postprocedure Evaluation (Signed)
Anesthesia Post Note  Patient: Stephen Hendrix  Procedure(s) Performed: TRANSURETHRAL RESECTION OF BLADDER TUMOR (TURBT) with GEMCITABINE (Bilateral: Bladder) CYSTOSCOPY WITH RETROGRADE PYELOGRAM (Bilateral: Ureter)     Patient location during evaluation: PACU Anesthesia Type: General Level of consciousness: awake and alert Pain management: pain level controlled Vital Signs Assessment: post-procedure vital signs reviewed and stable Respiratory status: spontaneous breathing, nonlabored ventilation, respiratory function stable and patient connected to nasal cannula oxygen Cardiovascular status: blood pressure returned to baseline and stable Postop Assessment: no apparent nausea or vomiting Anesthetic complications: no  No notable events documented.  Last Vitals:  Vitals:   07/01/23 1445 07/01/23 1500  BP: (!) 143/85 139/87  Pulse: 77 92  Resp: (!) 25 13  Temp: (!) 36.4 C   SpO2: 96% 93%    Last Pain:  Vitals:   07/01/23 1500  TempSrc:   PainSc: 0-No pain                 Shelton Silvas

## 2023-07-01 NOTE — Interval H&P Note (Signed)
History and Physical Interval Note:  07/01/2023 12:51 PM  Stephen Hendrix  has presented today for surgery, with the diagnosis of bladder tumor.  The various methods of treatment have been discussed with the patient and family. After consideration of risks, benefits and other options for treatment, the patient has consented to  Procedure(s): TRANSURETHRAL RESECTION OF BLADDER TUMOR (TURBT) with GEMCITABINE (Bilateral) CYSTOSCOPY WITH RETROGRADE PYELOGRAM (Bilateral) as a surgical intervention.  The patient's history has been reviewed, patient examined, no change in status, stable for surgery.  I have reviewed the patient's chart and labs.  Questions were answered to the patient's satisfaction.     [name]

## 2023-07-01 NOTE — Transfer of Care (Signed)
Immediate Anesthesia Transfer of Care Note  Patient: Stephen Hendrix  Procedure(s) Performed: TRANSURETHRAL RESECTION OF BLADDER TUMOR (TURBT) with GEMCITABINE (Bilateral: Bladder) CYSTOSCOPY WITH RETROGRADE PYELOGRAM (Bilateral: Ureter)  Patient Location: PACU  Anesthesia Type:General  Level of Consciousness: awake, alert , and oriented  Airway & Oxygen Therapy: Patient Spontanous Breathing and Patient connected to nasal cannula oxygen  Post-op Assessment: Report given to RN and Post -op Vital signs reviewed and stable  Post vital signs: Reviewed and stable  Last Vitals:  Vitals Value Taken Time  BP 128/83 07/01/23 1409  Temp 35.6 C 07/01/23 1409  Pulse 79 07/01/23 1412  Resp 18 07/01/23 1412  SpO2 93 % 07/01/23 1412  Vitals shown include unfiled device data.  Last Pain:  Vitals:   07/01/23 1055  TempSrc:   PainSc: 0-No pain         Complications: No notable events documented.

## 2023-07-01 NOTE — Anesthesia Procedure Notes (Signed)
Procedure Name: Intubation Date/Time: 07/01/2023 1:20 PM  Performed by: Hulan Fess, CRNAPre-anesthesia Checklist: Patient identified, Emergency Drugs available, Suction available and Patient being monitored Patient Re-evaluated:Patient Re-evaluated prior to induction Oxygen Delivery Method: Circle System Utilized Preoxygenation: Pre-oxygenation with 100% oxygen Induction Type: IV induction Ventilation: Mask ventilation without difficulty Laryngoscope Size: Glidescope and 4 Grade View: Grade III Tube type: Oral Tube size: 7.5 mm Number of attempts: 1 Airway Equipment and Method: Stylet and Oral airway Placement Confirmation: ETT inserted through vocal cords under direct vision, positive ETCO2 and breath sounds checked- equal and bilateral Secured at: 23 cm Tube secured with: Tape Dental Injury: Teeth and Oropharynx as per pre-operative assessment

## 2023-07-01 NOTE — Op Note (Signed)
OPERATIVE NOTE   Patient Name: Stephen Hendrix  MRN: [id number]   Date of Procedure: 07/01/2023   Preoperative diagnosis:  Bladder cancer  Postoperative diagnosis:  same  Procedure:  Cystoscopy, bilateral retrograde ureteropyelogram's, transurethral resection of bladder tumor less than 2 cm resection (maximal dimension of resection sites 1.5 cm)  Attending: Concepcion Living, MD  Anesthesia: GETA  Estimated blood loss: Minimal  Fluids: Per anesthesia record  Drains: 20 French Foley catheter  Specimens: Bladder tumor chips  Antibiotics: Rocephin  Findings: 2 small low-grade appearing papillary tumors distal into the right of the right ureteral orifice.  There is also an area of heaped up mucosa that was very friable just inside the bladder neck on the right that was resected as well.  Indications:  Bladder cancer  Description of Procedure:  The patient was brought to the operating room where he was correctly identified and then underwent satisfactory induction of general endotracheal anesthesia.  The patient was then positioned in the modified dorsolithotomy position and prepped and draped in the usual sterile fashion.  Preprocedural timeout was performed.  Cystourethroscopy was subsequently performed with a 22 [place] panendoscope.  This revealed a normal anterior urethra.  There is noted to be significant by lobar BPH.  The bladder is then entered and carefully inspected.  Both ureteral orifice ease appeared abnormal from prior TUR resections.  They were both somewhat patulous and somewhat proximal and location.  There was also marked bladder trabeculation and cellule formation.  There was noted to be an area of low-grade appearing papillary tumor distal to the right ureteral orifice and lateral.  Also an area of heaped up mucosa which was very friable just inside the bladder neck on the on the right.  Bilateral retrograde ureteropyelogram's were subsequently performed  with an open-ended catheter with real-time fluoroscopic interpretation.  There is no evidence of obstruction or fixed filling defects.  The 25 French continuous-flow resectoscope was subsequently placed using the visual obturator.  The tumor area distal to the right ureteral orifice was subsequently resected with a cutting current.  I used the fulguration current for hemostasis.  Similarly the area of friable mucosa just inside the bladder neck on the right was also resected again using the cutting current.  The fulguration current was used judiciously for hemostasis.  The specimen was subsequently retrieved and sent for pathologic analysis.  Hemostasis was verified to be intact.  The resectoscope was subsequently removed.  A 20 French Foley catheter was placed without difficulty with clear returns and the procedure terminated.  Patient tolerated the procedure well.  There were no apparent complications.  In the PACU I subsequently supervised the instillation of gemcitabine intravesical chemotherapy with a dwell time of 30 minutes.  2 g placed per protocol.  Complications: None  Condition: Stable, extubated, transferred to PACU  Plan: FU as scheduled to review path

## 2023-07-02 ENCOUNTER — Encounter (HOSPITAL_COMMUNITY): Payer: Self-pay | Admitting: Urology

## 2023-07-02 LAB — SURGICAL PATHOLOGY

## 2023-07-09 ENCOUNTER — Encounter: Payer: Self-pay | Admitting: Urology

## 2023-07-09 ENCOUNTER — Ambulatory Visit (INDEPENDENT_AMBULATORY_CARE_PROVIDER_SITE_OTHER): Payer: Medicare FFS | Admitting: Urology

## 2023-07-09 VITALS — BP 144/93 | HR 96

## 2023-07-09 DIAGNOSIS — C679 Malignant neoplasm of bladder, unspecified: Secondary | ICD-10-CM

## 2023-07-09 DIAGNOSIS — N401 Enlarged prostate with lower urinary tract symptoms: Secondary | ICD-10-CM

## 2023-07-09 LAB — URINALYSIS, ROUTINE W REFLEX MICROSCOPIC
Bilirubin, UA: NEGATIVE
Glucose, UA: NEGATIVE
Ketones, UA: NEGATIVE
Nitrite, UA: NEGATIVE
Specific Gravity, UA: 1.02 (ref 1.005–1.030)
Urobilinogen, Ur: 0.2 mg/dL (ref 0.2–1.0)
pH, UA: 5 (ref 5.0–7.5)

## 2023-07-09 LAB — MICROSCOPIC EXAMINATION

## 2023-07-09 NOTE — Progress Notes (Signed)
   Assessment: 1. Malignant neoplasm of urinary bladder, unspecified site (HCC)   2. Benign localized prostatic hyperplasia with lower urinary tract symptoms (LUTS)     Plan: Today I had a long discussion with the patient and family regarding his recurrent bladder cancer that is now high-grade. Discussed adjuvant therapy in detail today including the pros and cons as well as potential adverse events and side effects. Following our discussion, patient elects to begin induction BCG.  Will start after the Thanksgiving holiday.  Chief Complaint: Bladder cancer  HPI: Stephen Hendrix is a 89 y.o. male who presents for continued evaluation of bladder cancer. Patient also has longstanding BPH/lower urinary tract symptoms on combination medical therapy.  LUTS have remained stable and minimal.  Patient also has a history of low risk nonmuscle invasive bladder cancer.  Prior to his recent presentation he was last seen by me 01/2020. On evaluation with office cystoscopy 05/2023 the patient was found to have recurrent urothelial tumor. Status post TURBT 07/01/2023--pathology = high-grade TA.  Portions of the above documentation were copied from a prior visit for review purposes only.  Allergies: Allergies  Allergen Reactions   Corticosteroids Shortness Of Breath    HICCUPS AND RESPIRATORY DISTRESS   Methylprednisolone Shortness Of Breath    Pt tolerates po steroids   Other Anaphylaxis    IV steroid   Penicillins Rash and Other (See Comments)        Avocado     Other reaction(s): Other (See Comments) ORAL PEELING   Macrobid [Nitrofurantoin]     Rash    Cortisone     Hiccups for a week    PMH: Past Medical History:  Diagnosis Date   Anemia    Atrial fibrillation (HCC)    Cancer (HCC)    Complication of anesthesia    Small airway, vocal cord damaged   Hypertension    Pneumonia    TIA (transient ischemic attack)    Whooping cough     PSH: Past Surgical History:   Procedure Laterality Date   BLADDER SURGERY     CHOLECYSTECTOMY     CYSTOSCOPY W/ RETROGRADES Bilateral 07/01/2023   Procedure: CYSTOSCOPY WITH RETROGRADE PYELOGRAM;  Surgeon: [name], MD;  Location: [facility];  Service: Urology;  Laterality: Bilateral;   EYE SURGERY     HERNIA REPAIR     TONSILLECTOMY      SH: Social History   Tobacco Use   Smoking status: Never   Smokeless tobacco: Never  Vaping Use   Vaping status: Never Used  Substance Use Topics   Alcohol use: Yes    Alcohol/week: 2.0 standard drinks of alcohol    Types: 2 Glasses of wine per week    Comment: Wine   Drug use: No    ROS: Constitutional:  Negative for fever, chills, weight loss CV: Negative for chest pain, previous MI, hypertension Respiratory:  Negative for shortness of breath, wheezing, sleep apnea, frequent cough GI:  Negative for nausea, vomiting, bloody stool, GERD  PE: There were no vitals taken for this visit. GENERAL APPEARANCE:  Well appearing, well developed, well nourished, NAD

## 2023-07-29 ENCOUNTER — Ambulatory Visit: Payer: Medicare FFS | Admitting: Urology

## 2023-07-29 ENCOUNTER — Telehealth: Payer: Self-pay | Admitting: Urology

## 2023-07-29 NOTE — Telephone Encounter (Signed)
Would like Stephen Hendrix to call wife back

## 2023-07-30 NOTE — Telephone Encounter (Signed)
Spoke with DIL, she stated that pt wasn't feeling well and rescheduled for next week.

## 2023-08-05 ENCOUNTER — Ambulatory Visit: Payer: Medicare FFS | Admitting: Urology

## 2023-08-05 ENCOUNTER — Encounter: Payer: Self-pay | Admitting: Urology

## 2023-08-05 VITALS — BP 135/85 | HR 77

## 2023-08-05 DIAGNOSIS — N401 Enlarged prostate with lower urinary tract symptoms: Secondary | ICD-10-CM

## 2023-08-05 DIAGNOSIS — C679 Malignant neoplasm of bladder, unspecified: Secondary | ICD-10-CM | POA: Diagnosis not present

## 2023-08-05 MED ORDER — BCG LIVE 50 MG IS SUSR
3.2400 mL | Freq: Once | INTRAVESICAL | Status: AC
Start: 2023-08-05 — End: 2023-08-05
  Administered 2023-08-05: 81 mg via INTRAVESICAL

## 2023-08-05 NOTE — Progress Notes (Signed)
   Assessment: 1. Malignant neoplasm of urinary bladder, unspecified site (HCC)   2. Benign localized prostatic hyperplasia with lower urinary tract symptoms (LUTS)     Plan: BCG No. 1/6 today Follow-up 1 week  Chief Complaint: Bladder cancer  HPI: Stephen Hendrix is a 89 y.o. male who presents for continued evaluation of bladder cancer. Patient currently reports that he is doing very well.  He denies any recent gross hematuria and states that his lower urinary tract symptoms are at baseline and minimal.   Patient also has longstanding BPH/lower urinary tract symptoms on combination medical therapy.    Patient also has a history of low risk nonmuscle invasive bladder cancer.  Prior to his recent presentation he was last seen by me 01/2020. On evaluation with office cystoscopy 05/2023 the patient was found to have recurrent urothelial tumor. Status post TURBT 07/01/2023--pathology = high-grade TA.  Patient has elected BCG induction.  Portions of the above documentation were copied from a prior visit for review purposes only.  Allergies: Allergies  Allergen Reactions   Corticosteroids Shortness Of Breath    HICCUPS AND RESPIRATORY DISTRESS   Methylprednisolone Shortness Of Breath    Pt tolerates po steroids   Other Anaphylaxis    IV steroid   Penicillins Rash and Other (See Comments)        Avocado     Other reaction(s): Other (See Comments) ORAL PEELING   Macrobid [Nitrofurantoin]     Rash    Cortisone     Hiccups for a week    PMH: Past Medical History:  Diagnosis Date   Anemia    Atrial fibrillation (HCC)    Cancer (HCC)    Complication of anesthesia    Small airway, vocal cord damaged   Hypertension    Pneumonia    TIA (transient ischemic attack)    Whooping cough     PSH: Past Surgical History:  Procedure Laterality Date   BLADDER SURGERY     CHOLECYSTECTOMY     CYSTOSCOPY W/ RETROGRADES Bilateral 07/01/2023   Procedure: CYSTOSCOPY WITH  RETROGRADE PYELOGRAM;  Surgeon: [name], MD;  Location: [facility];  Service: Urology;  Laterality: Bilateral;   EYE SURGERY     HERNIA REPAIR     TONSILLECTOMY      SH: Social History   Tobacco Use   Smoking status: Never   Smokeless tobacco: Never  Vaping Use   Vaping status: Never Used  Substance Use Topics   Alcohol use: Yes    Alcohol/week: 2.0 standard drinks of alcohol    Types: 2 Glasses of wine per week    Comment: Wine   Drug use: No    ROS: Constitutional:  Negative for fever, chills, weight loss CV: Negative for chest pain, previous MI, hypertension Respiratory:  Negative for shortness of breath, wheezing, sleep apnea, frequent cough GI:  Negative for nausea, vomiting, bloody stool, GERD  PE: Today's Vitals   08/05/23 1425  BP: 135/85  Pulse: 77   GENERAL APPEARANCE:  Well appearing, well developed, well nourished, NAD    Results: Urinalysis shows some low-level pyuria without significant bacteriuria or hematuria.

## 2023-08-05 NOTE — Addendum Note (Signed)
Addended by: Carolin Coy on: 08/05/2023 04:15 PM   Modules accepted: Orders

## 2023-08-05 NOTE — Progress Notes (Signed)
BCG Bladder Instillation  BCG # 1  Due to Bladder Cancer patient is present today for a BCG treatment. Patient was cleaned and prepped in a sterile fashion with betadine. A 14FR catheter was inserted, urine return was noted 25ml, urine was yellow in color.  50ml of reconstituted BCG was instilled into the bladder. The catheter was then removed. Patient tolerated well, no complications were noted  Performed by: Savvas Roper N., CMA(AAMA)

## 2023-08-05 NOTE — Patient Instructions (Signed)

## 2023-08-06 LAB — URINALYSIS, ROUTINE W REFLEX MICROSCOPIC
Bilirubin, UA: NEGATIVE
Glucose, UA: NEGATIVE
Ketones, UA: NEGATIVE
Nitrite, UA: NEGATIVE
Protein,UA: NEGATIVE
Specific Gravity, UA: 1.025 (ref 1.005–1.030)
Urobilinogen, Ur: 0.2 mg/dL (ref 0.2–1.0)
pH, UA: 5.5 (ref 5.0–7.5)

## 2023-08-06 LAB — MICROSCOPIC EXAMINATION

## 2023-08-13 ENCOUNTER — Encounter: Payer: Self-pay | Admitting: Urology

## 2023-08-13 ENCOUNTER — Ambulatory Visit: Payer: Medicare FFS | Admitting: Urology

## 2023-08-13 DIAGNOSIS — C679 Malignant neoplasm of bladder, unspecified: Secondary | ICD-10-CM

## 2023-08-13 LAB — URINALYSIS, ROUTINE W REFLEX MICROSCOPIC
Bilirubin, UA: NEGATIVE
Glucose, UA: NEGATIVE
Ketones, UA: NEGATIVE
Leukocytes,UA: NEGATIVE
Nitrite, UA: NEGATIVE
RBC, UA: NEGATIVE
Specific Gravity, UA: 1.025 (ref 1.005–1.030)
Urobilinogen, Ur: 0.2 mg/dL (ref 0.2–1.0)
pH, UA: 6.5 (ref 5.0–7.5)

## 2023-08-13 MED ORDER — BCG LIVE 50 MG IS SUSR
3.2400 mL | Freq: Once | INTRAVESICAL | Status: AC
Start: 2023-08-13 — End: 2023-08-13
  Administered 2023-08-13: 81 mg via INTRAVESICAL

## 2023-08-13 NOTE — Addendum Note (Signed)
Addended by: Carolin Coy on: 08/13/2023 12:15 PM   Modules accepted: Orders

## 2023-08-13 NOTE — Progress Notes (Signed)
   Assessment: 1. Malignant neoplasm of urinary bladder, unspecified site (HCC)     Plan: FU AS SCHEDULED FOR NEXT BCG INSTILLATION  Chief Complaint: HERE FOR BCG  HPI: Stephen Hendrix is a 89 y.o. male who presents for continued evaluation of high risk nonmuscle invasive bladder cancer.  Patient is currently undergoing induction BCG.  He has tolerated his initial instillation quite well.  Urinalysis today is clear.   Portions of the above documentation were copied from a prior visit for review purposes only.  Allergies: Allergies  Allergen Reactions   Corticosteroids Shortness Of Breath    HICCUPS AND RESPIRATORY DISTRESS   Methylprednisolone Shortness Of Breath    Pt tolerates po steroids   Other Anaphylaxis    IV steroid   Penicillins Rash and Other (See Comments)        Avocado     Other reaction(s): Other (See Comments) ORAL PEELING   Macrobid [Nitrofurantoin]     Rash    Cortisone     Hiccups for a week    PMH: Past Medical History:  Diagnosis Date   Anemia    Atrial fibrillation (HCC)    Cancer (HCC)    Complication of anesthesia    Small airway, vocal cord damaged   Hypertension    Pneumonia    TIA (transient ischemic attack)    Whooping cough     PSH: Past Surgical History:  Procedure Laterality Date   BLADDER SURGERY     CHOLECYSTECTOMY     CYSTOSCOPY W/ RETROGRADES Bilateral 07/01/2023   Procedure: CYSTOSCOPY WITH RETROGRADE PYELOGRAM;  Surgeon: [name], MD;  Location: [facility];  Service: Urology;  Laterality: Bilateral;   EYE SURGERY     HERNIA REPAIR     TONSILLECTOMY      SH: Social History   Tobacco Use   Smoking status: Never   Smokeless tobacco: Never  Vaping Use   Vaping status: Never Used  Substance Use Topics   Alcohol use: Yes    Alcohol/week: 2.0 standard drinks of alcohol    Types: 2 Glasses of wine per week    Comment: Wine   Drug use: No    ROS: Constitutional:  Negative for fever, chills, weight  loss CV: Negative for chest pain, previous MI, hypertension Respiratory:  Negative for shortness of breath, wheezing, sleep apnea, frequent cough GI:  Negative for nausea, vomiting, bloody stool, GERD  PE: There were no vitals filed for this visit.  GENERAL APPEARANCE:  Well appearing, well developed, well nourished, NAD    Results: UA CLEAR

## 2023-08-13 NOTE — Progress Notes (Signed)
BCG Bladder Instillation  BCG # 2  Due to Bladder Cancer patient is present today for a BCG treatment. Patient was cleaned and prepped in a sterile fashion with betadine. A 14FR catheter was inserted, urine return was noted 75ml, urine was yellow in color.  50ml of reconstituted BCG was instilled into the bladder. The catheter was then removed. Patient tolerated well, no complications were noted  Performed by: Alla Sloma N., CMA(AAMA)

## 2023-08-26 ENCOUNTER — Encounter: Payer: Self-pay | Admitting: Urology

## 2023-08-26 ENCOUNTER — Ambulatory Visit: Payer: Medicare FFS | Admitting: Urology

## 2023-08-26 DIAGNOSIS — C679 Malignant neoplasm of bladder, unspecified: Secondary | ICD-10-CM | POA: Diagnosis not present

## 2023-08-26 LAB — URINALYSIS, ROUTINE W REFLEX MICROSCOPIC
Bilirubin, UA: NEGATIVE
Glucose, UA: NEGATIVE
Ketones, UA: NEGATIVE
Nitrite, UA: NEGATIVE
Specific Gravity, UA: 1.025 (ref 1.005–1.030)
Urobilinogen, Ur: 0.2 mg/dL (ref 0.2–1.0)
pH, UA: 5.5 (ref 5.0–7.5)

## 2023-08-26 LAB — MICROSCOPIC EXAMINATION

## 2023-08-26 NOTE — Progress Notes (Signed)
   Assessment: 1. Malignant neoplasm of urinary bladder, unspecified site (HCC)     Plan: BCG No. 3/6 administered today Follow-up 1 week for next instillation  Chief Complaint: Bladder cancer  HPI: Stephen Hendrix is a 89 y.o. male who presents for continued evaluation of bladder cancer. Patient is here today for #3/6 induction BCG.  He has tolerated his first 2 installations without difficulty.  No current complaints.  Portions of the above documentation were copied from a prior visit for review purposes only.  Allergies: Allergies  Allergen Reactions   Corticosteroids Shortness Of Breath    HICCUPS AND RESPIRATORY DISTRESS   Methylprednisolone  Shortness Of Breath    Pt tolerates po steroids   Other Anaphylaxis    IV steroid   Penicillins Rash and Other (See Comments)        Avocado     Other reaction(s): Other (See Comments) ORAL PEELING   Macrobid [Nitrofurantoin]     Rash    Cortisone     Hiccups for a week    PMH: Past Medical History:  Diagnosis Date   Anemia    Atrial fibrillation (HCC)    Cancer (HCC)    Complication of anesthesia    Small airway, vocal cord damaged   Hypertension    Pneumonia    TIA (transient ischemic attack)    Whooping cough     PSH: Past Surgical History:  Procedure Laterality Date   BLADDER SURGERY     CHOLECYSTECTOMY     CYSTOSCOPY W/ RETROGRADES Bilateral 07/01/2023   Procedure: CYSTOSCOPY WITH RETROGRADE PYELOGRAM;  Surgeon: Shona Layman BROCKS, MD;  Location: [facility];  Service: Urology;  Laterality: Bilateral;   EYE SURGERY     HERNIA REPAIR     TONSILLECTOMY      SH: Social History   Tobacco Use   Smoking status: Never   Smokeless tobacco: Never  Vaping Use   Vaping status: Never Used  Substance Use Topics   Alcohol use: Yes    Alcohol/week: 2.0 standard drinks of alcohol    Types: 2 Glasses of wine per week    Comment: Wine   Drug use: No    ROS: Constitutional:  Negative for fever, chills, weight  loss CV: Negative for chest pain, previous MI, hypertension Respiratory:  Negative for shortness of breath, wheezing, sleep apnea, frequent cough GI:  Negative for nausea, vomiting, bloody stool, GERD  PE:  GENERAL APPEARANCE:  Well appearing, well developed, well nourished, NAD    Results: UA is negative for significant blood or infection

## 2023-08-28 DIAGNOSIS — C679 Malignant neoplasm of bladder, unspecified: Secondary | ICD-10-CM | POA: Diagnosis not present

## 2023-08-28 MED ORDER — BCG LIVE 50 MG IS SUSR
3.2400 mL | Freq: Once | INTRAVESICAL | Status: AC
  Administered 2023-08-28: 81 mg via INTRAVESICAL

## 2023-08-28 NOTE — Addendum Note (Signed)
 Addended by: Carolin Coy on: 08/28/2023 10:02 AM   Modules accepted: Orders

## 2023-08-28 NOTE — Progress Notes (Signed)
 BCG Bladder Instillation  BCG # 3  Due to Bladder Cancer patient is present today for a BCG treatment. Patient was cleaned and prepped in a sterile fashion with betadine. A 14FR catheter was inserted, urine return was noted 25ml, urine was YELLOW in color.  50ml of reconstituted BCG was instilled into the bladder. The catheter was then removed. Patient tolerated well, no complications were noted  Performed by: Rector Devonshire N., CMA(AAMA)

## 2023-09-03 ENCOUNTER — Encounter: Payer: Self-pay | Admitting: Urology

## 2023-09-03 ENCOUNTER — Ambulatory Visit: Payer: Medicare FFS | Admitting: Urology

## 2023-09-03 DIAGNOSIS — C679 Malignant neoplasm of bladder, unspecified: Secondary | ICD-10-CM

## 2023-09-03 LAB — URINALYSIS, ROUTINE W REFLEX MICROSCOPIC
Bilirubin, UA: NEGATIVE
Glucose, UA: NEGATIVE
Ketones, UA: NEGATIVE
Leukocytes,UA: NEGATIVE
Nitrite, UA: NEGATIVE
Protein,UA: NEGATIVE
RBC, UA: NEGATIVE
Specific Gravity, UA: 1.025 (ref 1.005–1.030)
Urobilinogen, Ur: 0.2 mg/dL (ref 0.2–1.0)
pH, UA: 5.5 (ref 5.0–7.5)

## 2023-09-03 MED ORDER — BCG LIVE 50 MG IS SUSR
3.2400 mL | Freq: Once | INTRAVESICAL | Status: AC
  Administered 2023-09-03: 81 mg via INTRAVESICAL

## 2023-09-03 NOTE — Progress Notes (Signed)
   Assessment: 1. Malignant neoplasm of urinary bladder, unspecified site (HCC)     Plan: BCG 4 of 6 today FU 1 week for next instillation  Chief Complaint: Chief Complaint  Patient presents with   Bladder Cancer    HPI: Stephen Hendrix is a 89 y.o. male who presents for continued evaluation of high risk NMIBC. Here today for BCG induction #4/6.  Has tolerated prior treatments well. No current complaints   Portions of the above documentation were copied from a prior visit for review purposes only.  Allergies: Allergies  Allergen Reactions   Corticosteroids Shortness Of Breath    HICCUPS AND RESPIRATORY DISTRESS   Methylprednisolone  Shortness Of Breath    Pt tolerates po steroids   Other Anaphylaxis    IV steroid   Penicillins Rash and Other (See Comments)        Avocado     Other reaction(s): Other (See Comments) ORAL PEELING   Macrobid [Nitrofurantoin]     Rash    Cortisone     Hiccups for a week    PMH: Past Medical History:  Diagnosis Date   Anemia    Atrial fibrillation (HCC)    Cancer (HCC)    Complication of anesthesia    Small airway, vocal cord damaged   Hypertension    Pneumonia    TIA (transient ischemic attack)    Whooping cough     PSH: Past Surgical History:  Procedure Laterality Date   BLADDER SURGERY     CHOLECYSTECTOMY     CYSTOSCOPY W/ RETROGRADES Bilateral 07/01/2023   Procedure: CYSTOSCOPY WITH RETROGRADE PYELOGRAM;  Surgeon: Shona Layman BROCKS, MD;  Location: [facility];  Service: Urology;  Laterality: Bilateral;   EYE SURGERY     HERNIA REPAIR     TONSILLECTOMY      SH: Social History   Tobacco Use   Smoking status: Never   Smokeless tobacco: Never  Vaping Use   Vaping status: Never Used  Substance Use Topics   Alcohol use: Yes    Alcohol/week: 2.0 standard drinks of alcohol    Types: 2 Glasses of wine per week    Comment: Wine   Drug use: No    ROS: Constitutional:  Negative for fever, chills, weight loss CV:  Negative for chest pain, previous MI, hypertension Respiratory:  Negative for shortness of breath, wheezing, sleep apnea, frequent cough GI:  Negative for nausea, vomiting, bloody stool, GERD  PE: There were no vitals taken for this visit. GENERAL APPEARANCE:  Well appearing, well developed, well nourished, NAD

## 2023-09-03 NOTE — Progress Notes (Signed)
 BCG Bladder Instillation  BCG # 4 of 6  Due to Bladder Cancer patient is present today for a BCG treatment. Patient was cleaned and prepped in a sterile fashion with betadine. A 14FR catheter was inserted, urine return was noted 30ml, urine was yellow in color.  50ml of reconstituted BCG was instilled into the bladder. The catheter was then removed. Patient tolerated well, no complications were noted  Performed by: Jaimeson Gopal CMA  Follow up/ Additional notes: RTC in 1 week fo0r BCG #5.

## 2023-09-11 ENCOUNTER — Encounter: Payer: Self-pay | Admitting: Urology

## 2023-09-11 ENCOUNTER — Ambulatory Visit: Payer: Medicare FFS | Admitting: Urology

## 2023-09-11 DIAGNOSIS — C679 Malignant neoplasm of bladder, unspecified: Secondary | ICD-10-CM | POA: Diagnosis not present

## 2023-09-11 LAB — MICROSCOPIC EXAMINATION

## 2023-09-11 LAB — URINALYSIS, ROUTINE W REFLEX MICROSCOPIC
Bilirubin, UA: NEGATIVE
Glucose, UA: NEGATIVE
Ketones, UA: NEGATIVE
Nitrite, UA: NEGATIVE
Protein,UA: NEGATIVE
RBC, UA: NEGATIVE
Specific Gravity, UA: 1.02 (ref 1.005–1.030)
Urobilinogen, Ur: 0.2 mg/dL (ref 0.2–1.0)
pH, UA: 5.5 (ref 5.0–7.5)

## 2023-09-11 MED ORDER — BCG LIVE 50 MG IS SUSR
3.2400 mL | Freq: Once | INTRAVESICAL | Status: AC
Start: 2023-09-11 — End: 2023-09-11
  Administered 2023-09-11: 81 mg via INTRAVESICAL

## 2023-09-11 NOTE — Addendum Note (Signed)
Addended by: Carolin Coy on: 09/11/2023 02:12 PM   Modules accepted: Orders

## 2023-09-11 NOTE — Progress Notes (Signed)
   Assessment: 1. Malignant neoplasm of urinary bladder, unspecified site [facility])     Plan: BCG induction #5 of 6 installations today Follow-up 1 week as scheduled  Chief Complaint: Bladder cancer  HPI: Stephen Hendrix is a 89 y.o. male who presents for continued evaluation of high risk nonmuscle invasive bladder cancer. Patient is undergoing induction BCG and is here today for #5/6.  He has tolerated his treatments thus far extremely well.  He reports minimal lower urinary tract symptoms.  He has had no recent gross hematuria.  No other complaints.   Portions of the above documentation were copied from a prior visit for review purposes only.  Allergies: Allergies  Allergen Reactions   Corticosteroids Shortness Of Breath    HICCUPS AND RESPIRATORY DISTRESS   Methylprednisolone Shortness Of Breath    Pt tolerates po steroids   Other Anaphylaxis    IV steroid   Penicillins Rash and Other (See Comments)        Avocado     Other reaction(s): Other (See Comments) ORAL PEELING   Macrobid [Nitrofurantoin]     Rash    Cortisone     Hiccups for a week    PMH: Past Medical History:  Diagnosis Date   Anemia    Atrial fibrillation (HCC)    Cancer (HCC)    Complication of anesthesia    Small airway, vocal cord damaged   Hypertension    Pneumonia    TIA (transient ischemic attack)    Whooping cough     PSH: Past Surgical History:  Procedure Laterality Date   BLADDER SURGERY     CHOLECYSTECTOMY     CYSTOSCOPY W/ RETROGRADES Bilateral 07/01/2023   Procedure: CYSTOSCOPY WITH RETROGRADE PYELOGRAM;  Surgeon: [name], MD;  Location: [facility];  Service: Urology;  Laterality: Bilateral;   EYE SURGERY     HERNIA REPAIR     TONSILLECTOMY      SH: Social History   Tobacco Use   Smoking status: Never   Smokeless tobacco: Never  Vaping Use   Vaping status: Never Used  Substance Use Topics   Alcohol use: Yes    Alcohol/week: 2.0 standard drinks of alcohol     Types: 2 Glasses of wine per week    Comment: Wine   Drug use: No    ROS: Constitutional:  Negative for fever, chills, weight loss CV: Negative for chest pain, previous MI, hypertension Respiratory:  Negative for shortness of breath, wheezing, sleep apnea, frequent cough GI:  Negative for nausea, vomiting, bloody stool, GERD  PE: There were no vitals taken for this visit. GENERAL APPEARANCE:  Well appearing, well developed, well nourished, NAD    Results: UA negative for significant blood or infection

## 2023-09-11 NOTE — Progress Notes (Signed)
 BCG Bladder Instillation  BCG # 5  Due to Bladder Cancer patient is present today for a BCG treatment. Patient was cleaned and prepped in a sterile fashion with betadine. A 14FR catheter was inserted, urine return was noted 25ml, urine was yellow in color.  50ml of reconstituted BCG was instilled into the bladder. The catheter was then removed. Patient tolerated well, no complications were noted  Performed by: Augustina Braddock N., CMA(AAMA)

## 2023-09-18 ENCOUNTER — Ambulatory Visit: Payer: Medicare FFS | Admitting: Urology

## 2023-09-18 ENCOUNTER — Encounter: Payer: Self-pay | Admitting: Urology

## 2023-09-18 DIAGNOSIS — C679 Malignant neoplasm of bladder, unspecified: Secondary | ICD-10-CM | POA: Diagnosis not present

## 2023-09-18 MED ORDER — BCG LIVE 50 MG IS SUSR
3.2400 mL | Freq: Once | INTRAVESICAL | Status: AC
  Administered 2023-09-18: 81 mg via INTRAVESICAL

## 2023-09-18 NOTE — Progress Notes (Signed)
   Assessment: 1. Malignant neoplasm of urinary bladder, unspecified site [facility])     Plan: Patient completed induction BCG today FU 6 weeks for cystoscopy/cytology  Chief Complaint: Here for bcg  HPI: Stephen Hendrix is a 89 y.o. male who presents for continued evaluation of bladder cancer.  He is here today for his last induction BCG installation.  He has tolerated treatment very well.  No current complaints.   Portions of the above documentation were copied from a prior visit for review purposes only.  Allergies: Allergies  Allergen Reactions   Corticosteroids Shortness Of Breath    HICCUPS AND RESPIRATORY DISTRESS   Methylprednisolone Shortness Of Breath    Pt tolerates po steroids   Other Anaphylaxis    IV steroid   Penicillins Rash and Other (See Comments)        Avocado     Other reaction(s): Other (See Comments) ORAL PEELING   Macrobid [Nitrofurantoin]     Rash    Cortisone     Hiccups for a week    PMH: Past Medical History:  Diagnosis Date   Anemia    Atrial fibrillation (HCC)    Cancer (HCC)    Complication of anesthesia    Small airway, vocal cord damaged   Hypertension    Pneumonia    TIA (transient ischemic attack)    Whooping cough     PSH: Past Surgical History:  Procedure Laterality Date   BLADDER SURGERY     CHOLECYSTECTOMY     CYSTOSCOPY W/ RETROGRADES Bilateral 07/01/2023   Procedure: CYSTOSCOPY WITH RETROGRADE PYELOGRAM;  Surgeon: [name], MD;  Location: [facility];  Service: Urology;  Laterality: Bilateral;   EYE SURGERY     HERNIA REPAIR     TONSILLECTOMY      SH: Social History   Tobacco Use   Smoking status: Never   Smokeless tobacco: Never  Vaping Use   Vaping status: Never Used  Substance Use Topics   Alcohol use: Yes    Alcohol/week: 2.0 standard drinks of alcohol    Types: 2 Glasses of wine per week    Comment: Wine   Drug use: No    ROS: Constitutional:  Negative for fever, chills, weight loss CV:  Negative for chest pain, previous MI, hypertension Respiratory:  Negative for shortness of breath, wheezing, sleep apnea, frequent cough GI:  Negative for nausea, vomiting, bloody stool, GERD  PE: GENERAL APPEARANCE:  Well appearing, well developed, well nourished, NAD    Results: Urine clear

## 2023-09-18 NOTE — Progress Notes (Signed)
BCG Bladder Instillation  BCG # 6  Due to Bladder Cancer patient is present today for a BCG treatment. Patient was cleaned and prepped in a sterile fashion with betadine. A 14FR catheter was inserted, urine return was noted 25ml, urine was yellow in color.  50ml of reconstituted BCG was instilled into the bladder. The catheter was then removed. Patient tolerated well, no complications were noted  Performed by: Broedy Osbourne N., CMA(AAMA)

## 2023-09-22 LAB — URINALYSIS, ROUTINE W REFLEX MICROSCOPIC
Bilirubin, UA: NEGATIVE
Glucose, UA: NEGATIVE
Ketones, UA: NEGATIVE
Leukocytes,UA: NEGATIVE
Nitrite, UA: NEGATIVE
RBC, UA: NEGATIVE
Specific Gravity, UA: >1.03 — ABNORMAL HIGH (ref 1.005–1.030)
Urobilinogen, Ur: 0.2 mg/dL (ref 0.2–1.0)
pH, UA: 5.5 (ref 5.0–7.5)

## 2023-11-05 ENCOUNTER — Ambulatory Visit (INDEPENDENT_AMBULATORY_CARE_PROVIDER_SITE_OTHER): Admitting: Urology

## 2023-11-05 ENCOUNTER — Encounter: Payer: Self-pay | Admitting: Urology

## 2023-11-05 VITALS — BP 163/77 | HR 65

## 2023-11-05 DIAGNOSIS — C679 Malignant neoplasm of bladder, unspecified: Secondary | ICD-10-CM

## 2023-11-05 DIAGNOSIS — Z8551 Personal history of malignant neoplasm of bladder: Secondary | ICD-10-CM | POA: Diagnosis not present

## 2023-11-05 LAB — URINALYSIS, ROUTINE W REFLEX MICROSCOPIC
Bilirubin, UA: NEGATIVE
Glucose, UA: NEGATIVE
Ketones, UA: NEGATIVE
Leukocytes,UA: NEGATIVE
Nitrite, UA: NEGATIVE
Protein,UA: NEGATIVE
RBC, UA: NEGATIVE
Specific Gravity, UA: 1.025 (ref 1.005–1.030)
Urobilinogen, Ur: 0.2 mg/dL (ref 0.2–1.0)
pH, UA: 5.5 (ref 5.0–7.5)

## 2023-11-05 NOTE — Progress Notes (Signed)
   Assessment: 1. Malignant neoplasm of urinary bladder, unspecified site [facility])     Plan: FU bw cytology He will FU in 2 weeks to discuss further options for FU and treatment.  He [place] in [place]  Chief Complaint: Bladder cancer  HPI: Stephen Hendrix is a 89 y.o. male who presents for continued evaluation of high risk also invasive bladder cancer.  See multiple prior notes.  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 Patient also has bph on avodart + tamsulosin. Patient is status post TURBT 07/01/2003 which demonstrated high-grade TA tumor. Patient completed induction BCG 09/18/2023 which he tolerated well.   Portions of the above documentation were copied from a prior visit for review purposes only.  Allergies: Allergies  Allergen Reactions   Corticosteroids Shortness Of Breath    HICCUPS AND RESPIRATORY DISTRESS   Methylprednisolone Shortness Of Breath    Pt tolerates po steroids   Other Anaphylaxis    IV steroid   Penicillins Rash and Other (See Comments)        Avocado     Other reaction(s): Other (See Comments) ORAL PEELING   Macrobid [Nitrofurantoin]     Rash    Cortisone     Hiccups for a week    PMH: Past Medical History:  Diagnosis Date   Anemia    Atrial fibrillation (HCC)    Cancer (HCC)    Complication of anesthesia    Small airway, vocal cord damaged   Hypertension    Pneumonia    TIA (transient ischemic attack)    Whooping cough     PSH: Past Surgical History:  Procedure Laterality Date   BLADDER SURGERY     CHOLECYSTECTOMY     CYSTOSCOPY W/ RETROGRADES Bilateral 07/01/2023   Procedure: CYSTOSCOPY WITH RETROGRADE PYELOGRAM;  Surgeon: [name], MD;  Location: [facility];  Service: Urology;  Laterality: Bilateral;   EYE SURGERY     HERNIA REPAIR     TONSILLECTOMY      SH: Social History   Tobacco Use   Smoking status: Never   Smokeless tobacco: Never  Vaping Use    Vaping status: Never Used  Substance Use Topics   Alcohol use: Yes    Alcohol/week: 2.0 standard drinks of alcohol    Types: 2 Glasses of wine per week    Comment: Wine   Drug use: No    ROS: Constitutional:  Negative for fever, chills, weight loss CV: Negative for chest pain, previous MI, hypertension Respiratory:  Negative for shortness of breath, wheezing, sleep apnea, frequent cough GI:  Negative for nausea, vomiting, bloody stool, GERD  PE: Vitals:   11/05/23 1308  BP: (!) 163/77  Pulse: 65    GENERAL APPEARANCE:  Well appearing, well developed, well nourished, NAD    Results: UA: clear   Procedure: Cystourethroscopy  Indications: Bladder cancer  Description of procedure:  The patient was brought to the procedure room where he was correctly identified and the procedure again reviewed with him.  Informed consent was obtained.  Patient was positioned supine and his external genitalia were prepped and draped in usual fashion.  Flexible cystoscopy was subsequently performed. Anterior urethra--normal Prostatic urethra--mild lat lobe enlargement Bladder--UO's normal.  Marked trabeculation and cellules.  No focal mucosal lesions.  No evidence of recurrent tumor. Bladder washing obtained for cytology.  Procedure was well-tolerated.  No complications.

## 2023-11-06 ENCOUNTER — Other Ambulatory Visit: Payer: Medicare FFS | Admitting: Urology

## 2023-11-17 ENCOUNTER — Encounter: Payer: Self-pay | Admitting: Urology

## 2023-11-19 ENCOUNTER — Encounter: Payer: Self-pay | Admitting: Urology

## 2023-11-19 ENCOUNTER — Ambulatory Visit (INDEPENDENT_AMBULATORY_CARE_PROVIDER_SITE_OTHER): Admitting: Urology

## 2023-11-19 DIAGNOSIS — C679 Malignant neoplasm of bladder, unspecified: Secondary | ICD-10-CM | POA: Diagnosis not present

## 2023-11-19 LAB — URINALYSIS, ROUTINE W REFLEX MICROSCOPIC
Bilirubin, UA: NEGATIVE
Glucose, UA: NEGATIVE
Ketones, UA: NEGATIVE
Leukocytes,UA: NEGATIVE
Nitrite, UA: NEGATIVE
Protein,UA: NEGATIVE
RBC, UA: NEGATIVE
Specific Gravity, UA: 1.025 (ref 1.005–1.030)
Urobilinogen, Ur: 0.2 mg/dL (ref 0.2–1.0)
pH, UA: 5.5 (ref 5.0–7.5)

## 2023-11-19 NOTE — Progress Notes (Signed)
   Assessment: 1. 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  Following our discussion today he would like to begin with standard maintenance therapy.  He will receive 3 weekly treatments beginning in 4 weeks.  He will then need follow-up cystoscopy and ongoing maintenance 3 to 4 months later.  All questions answered today.  Chief Complaint: Bladder cancer  HPI: Stephen Hendrix is a 89 y.o. male who presents for continued evaluation of high risk nonmuscle of bladder cancer.  Patient is accompanied today by his wife and daughter.  His recent follow-up cystoscopy after induction BCG showed no endoscopic evidence of recurrence.  His cytology however did show dysplasia and a positive FISH.  Clinically he is doing very well.  No GU complaints.  Urinalysis is entirely clear today.   Portions of the above documentation were copied from a prior visit for review purposes only.  Allergies: Allergies  Allergen Reactions   Corticosteroids Shortness Of Breath    HICCUPS AND RESPIRATORY DISTRESS   Methylprednisolone Shortness Of Breath    Pt tolerates po steroids   Other Anaphylaxis    IV steroid   Penicillins Rash and Other (See Comments)        Avocado     Other reaction(s): Other (See Comments) ORAL PEELING   Macrobid [Nitrofurantoin]     Rash    Cortisone     Hiccups for a week    PMH: Past Medical History:  Diagnosis Date   Anemia    Atrial fibrillation (HCC)    Cancer (HCC)    Complication of anesthesia    Small airway, vocal cord damaged   Hypertension    Pneumonia    TIA (transient ischemic attack)    Whooping cough     PSH: Past Surgical History:  Procedure Laterality Date   BLADDER SURGERY     CHOLECYSTECTOMY     CYSTOSCOPY W/ RETROGRADES Bilateral  07/01/2023   Procedure: CYSTOSCOPY WITH RETROGRADE PYELOGRAM;  Surgeon: [name], MD;  Location: [facility];  Service: Urology;  Laterality: Bilateral;   EYE SURGERY     HERNIA REPAIR     TONSILLECTOMY      SH: Social History   Tobacco Use   Smoking status: Never   Smokeless tobacco: Never  Vaping Use   Vaping status: Never Used  Substance Use Topics   Alcohol use: Yes    Alcohol/week: 2.0 standard drinks of alcohol    Types: 2 Glasses of wine per week    Comment: Wine   Drug use: No    ROS: Constitutional:  Negative for fever, chills, weight loss CV: Negative for chest pain, previous MI, hypertension Respiratory:  Negative for shortness of breath, wheezing, sleep apnea, frequent cough GI:  Negative for nausea, vomiting, bloody stool, GERD  PE: GENERAL APPEARANCE:  Well appearing, well developed, well nourished, NAD    Results: Ua clear

## 2023-12-24 ENCOUNTER — Encounter: Payer: Self-pay | Admitting: Urology

## 2023-12-24 ENCOUNTER — Ambulatory Visit (INDEPENDENT_AMBULATORY_CARE_PROVIDER_SITE_OTHER): Admitting: Urology

## 2023-12-24 DIAGNOSIS — C679 Malignant neoplasm of bladder, unspecified: Secondary | ICD-10-CM | POA: Diagnosis not present

## 2023-12-24 DIAGNOSIS — Z5112 Encounter for antineoplastic immunotherapy: Secondary | ICD-10-CM

## 2023-12-24 LAB — URINALYSIS, ROUTINE W REFLEX MICROSCOPIC
Bilirubin, UA: NEGATIVE
Glucose, UA: NEGATIVE
Ketones, UA: NEGATIVE
Leukocytes,UA: NEGATIVE
Nitrite, UA: NEGATIVE
Specific Gravity, UA: 1.025 (ref 1.005–1.030)
Urobilinogen, Ur: 0.2 mg/dL (ref 0.2–1.0)
pH, UA: 5 (ref 5.0–7.5)

## 2023-12-24 LAB — MICROSCOPIC EXAMINATION

## 2023-12-24 MED ORDER — BCG LIVE 50 MG IS SUSR
3.2400 mL | Freq: Once | INTRAVESICAL | Status: AC
  Administered 2023-12-24: 81 mg via INTRAVESICAL

## 2023-12-24 NOTE — Progress Notes (Signed)
 BCG Bladder Instillation  BCG # 1 of 3  Due to Bladder Cancer patient is present today for a BCG treatment. Patient was cleaned and prepped in a sterile fashion with betadine. A 14FR coude catheter was inserted, urine return was noted 20ml, urine was yellow in color.  50ml of reconstituted BCG was instilled into the bladder. The catheter was then removed. Patient tolerated well, no complications were noted  Performed by: Zay Yeargan CMA  Follow up/ Additional notes: RTC in 1 week.

## 2023-12-24 NOTE — Progress Notes (Signed)
  Assessment: 1. Malignant neoplasm of urinary bladder, unspecified site [facility]); high grade, Ta; s/p induction BCG   2. Encounter for antineoplastic immunotherapy     Plan: BCG #1/3 given today. Return to office in 1 week for BCG. Will need surveillance cystoscopy approximately 6 weeks after his last BCG treatment.  Chief Complaint: Bladder cancer  HPI: Stephen Hendrix is a 89 y.o. male who presents for continued evaluation of high risk nonmuscle of bladder cancer.  Patient is accompanied today by his wife and daughter.  His recent follow-up cystoscopy after induction BCG showed no endoscopic evidence of recurrence.  His cytology however did show dysplasia and a positive FISH.  Clinically he is doing very well.  No GU complaints.   He presents today for maintenance BCG therapy #1/3. He is doing well.  No new lower urinary tract symptoms.  No dysuria or gross hematuria.  Portions of the above documentation were copied from a prior visit for review purposes only.  Allergies: Allergies  Allergen Reactions   Corticosteroids Shortness Of Breath    HICCUPS AND RESPIRATORY DISTRESS   Methylprednisolone  Shortness Of Breath    Pt tolerates po steroids   Other Anaphylaxis    IV steroid   Penicillins Rash and Other (See Comments)        Avocado     Other reaction(s): Other (See Comments) ORAL PEELING   Macrobid [Nitrofurantoin]     Rash    Cortisone     Hiccups for a week    PMH: Past Medical History:  Diagnosis Date   Anemia    Atrial fibrillation (HCC)    Cancer (HCC)    Complication of anesthesia    Small airway, vocal cord damaged   Hypertension    Pneumonia    TIA (transient ischemic attack)    Whooping cough     PSH: Past Surgical History:  Procedure Laterality Date   BLADDER SURGERY     CHOLECYSTECTOMY     CYSTOSCOPY W/ RETROGRADES Bilateral 07/01/2023   Procedure: CYSTOSCOPY WITH RETROGRADE PYELOGRAM;  Surgeon: Scarlet Curly, MD;  Location: [facility];   Service: Urology;  Laterality: Bilateral;   EYE SURGERY     HERNIA REPAIR     TONSILLECTOMY      SH: Social History   Tobacco Use   Smoking status: Never   Smokeless tobacco: Never  Vaping Use   Vaping status: Never Used  Substance Use Topics   Alcohol use: Yes    Alcohol/week: 2.0 standard drinks of alcohol    Types: 2 Glasses of wine per week    Comment: Wine   Drug use: No    ROS: Constitutional:  Negative for fever, chills, weight loss CV: Negative for chest pain, previous MI, hypertension Respiratory:  Negative for shortness of breath, wheezing, sleep apnea, frequent cough GI:  Negative for nausea, vomiting, bloody stool, GERD  PE: GENERAL APPEARANCE:  Well appearing, well developed, well nourished, NAD HEENT:  Atraumatic, normocephalic, oropharynx clear NECK:  Supple  ABDOMEN:  Soft, non-tender, no masses EXTREMITIES:  Moves all extremities well, without clubbing, cyanosis, or edema NEUROLOGIC:  Alert and oriented x 3, normal gait, CN II-XII grossly intact MENTAL STATUS:  appropriate BACK:  Non-tender to palpation, No CVAT SKIN:  Warm, dry, and intact  Results: U/A:  0-5 WBC, 0-2 RBC

## 2023-12-30 NOTE — Progress Notes (Incomplete)
  Assessment: No diagnosis found.   Plan: BCG #2/3 given today. Return to office in 1 week for BCG. Will need surveillance cystoscopy approximately 6 weeks after his last BCG treatment.  Chief Complaint: Bladder cancer  HPI: Stephen Hendrix is a 89 y.o. male who presents for continued evaluation of high risk nonmuscle of bladder cancer.  Patient is accompanied today by his wife and daughter.  His recent follow-up cystoscopy after induction BCG showed no endoscopic evidence of recurrence.  His cytology however did show dysplasia and a positive FISH.  Clinically he is doing very well.  No GU complaints.   He presents today for maintenance BCG therapy #1/3. He is doing well.  No new lower urinary tract symptoms.  No dysuria or gross hematuria.  Portions of the above documentation were copied from a prior visit for review purposes only.  Allergies: Allergies  Allergen Reactions   Corticosteroids Shortness Of Breath    HICCUPS AND RESPIRATORY DISTRESS   Methylprednisolone  Shortness Of Breath    Pt tolerates po steroids   Other Anaphylaxis    IV steroid   Penicillins Rash and Other (See Comments)        Avocado     Other reaction(s): Other (See Comments) ORAL PEELING   Macrobid [Nitrofurantoin]     Rash    Cortisone     Hiccups for a week    PMH: Past Medical History:  Diagnosis Date   Anemia    Atrial fibrillation (HCC)    Cancer (HCC)    Complication of anesthesia    Small airway, vocal cord damaged   Hypertension    Pneumonia    TIA (transient ischemic attack)    Whooping cough     PSH: Past Surgical History:  Procedure Laterality Date   BLADDER SURGERY     CHOLECYSTECTOMY     CYSTOSCOPY W/ RETROGRADES Bilateral 07/01/2023   Procedure: CYSTOSCOPY WITH RETROGRADE PYELOGRAM;  Surgeon: Scarlet Curly, MD;  Location: [facility];  Service: Urology;  Laterality: Bilateral;   EYE SURGERY     HERNIA REPAIR     TONSILLECTOMY      SH: Social History    Tobacco Use   Smoking status: Never   Smokeless tobacco: Never  Vaping Use   Vaping status: Never Used  Substance Use Topics   Alcohol use: Yes    Alcohol/week: 2.0 standard drinks of alcohol    Types: 2 Glasses of wine per week    Comment: Wine   Drug use: No    ROS: Constitutional:  Negative for fever, chills, weight loss CV: Negative for chest pain, previous MI, hypertension Respiratory:  Negative for shortness of breath, wheezing, sleep apnea, frequent cough GI:  Negative for nausea, vomiting, bloody stool, GERD  PE: GENERAL APPEARANCE:  Well appearing, well developed, well nourished, NAD HEENT:  Atraumatic, normocephalic, oropharynx clear NECK:  Supple  ABDOMEN:  Soft, non-tender, no masses EXTREMITIES:  Moves all extremities well, without clubbing, cyanosis, or edema NEUROLOGIC:  Alert and oriented x 3, normal gait, CN II-XII grossly intact MENTAL STATUS:  appropriate BACK:  Non-tender to palpation, No CVAT SKIN:  Warm, dry, and intact  Results: U/A:  0-5 WBC, 0-2 RBC

## 2023-12-31 ENCOUNTER — Ambulatory Visit (INDEPENDENT_AMBULATORY_CARE_PROVIDER_SITE_OTHER): Admitting: Urology

## 2023-12-31 DIAGNOSIS — C679 Malignant neoplasm of bladder, unspecified: Secondary | ICD-10-CM | POA: Diagnosis not present

## 2023-12-31 DIAGNOSIS — Z5112 Encounter for antineoplastic immunotherapy: Secondary | ICD-10-CM

## 2023-12-31 LAB — URINALYSIS, ROUTINE W REFLEX MICROSCOPIC
Bilirubin, UA: NEGATIVE
Glucose, UA: NEGATIVE
Nitrite, UA: NEGATIVE
Specific Gravity, UA: 1.025 (ref 1.005–1.030)
Urobilinogen, Ur: 0.2 mg/dL (ref 0.2–1.0)
pH, UA: 5 (ref 5.0–7.5)

## 2023-12-31 MED ORDER — BCG LIVE 50 MG IS SUSR
3.2400 mL | Freq: Once | INTRAVESICAL | Status: AC
  Administered 2023-12-31: 81 mg via INTRAVESICAL

## 2023-12-31 NOTE — Progress Notes (Addendum)
 BCG Bladder Instillation  BCG # 2 of 3  Due to Bladder Cancer patient is present today for a BCG treatment. Patient was cleaned and prepped in a sterile fashion with betadine. A 14 FR coude catheter was inserted, urine return was noted 10ml, urine was yellow in color.  50ml of reconstituted BCG was instilled into the bladder. The catheter was then removed. Patient tolerated well, no complications were noted  Performed by: Everlina Hock CMA & Chelsea LPN  Follow up/ Additional notes: RTC in 1 week for BCG #3.  I supervised the above procedure.

## 2024-01-07 ENCOUNTER — Encounter: Payer: Self-pay | Admitting: Urology

## 2024-01-07 ENCOUNTER — Ambulatory Visit (INDEPENDENT_AMBULATORY_CARE_PROVIDER_SITE_OTHER): Admitting: Urology

## 2024-01-07 DIAGNOSIS — Z5112 Encounter for antineoplastic immunotherapy: Secondary | ICD-10-CM

## 2024-01-07 DIAGNOSIS — C679 Malignant neoplasm of bladder, unspecified: Secondary | ICD-10-CM | POA: Diagnosis not present

## 2024-01-07 LAB — URINALYSIS, ROUTINE W REFLEX MICROSCOPIC
Bilirubin, UA: NEGATIVE
Glucose, UA: NEGATIVE
Nitrite, UA: NEGATIVE
Specific Gravity, UA: 1.025 (ref 1.005–1.030)
Urobilinogen, Ur: 0.2 mg/dL (ref 0.2–1.0)
pH, UA: 5 (ref 5.0–7.5)

## 2024-01-07 LAB — MICROSCOPIC EXAMINATION: WBC, UA: >30 /HPF — AB (ref 0–5)

## 2024-01-07 MED ORDER — BCG LIVE 50 MG IS SUSR
3.2400 mL | Freq: Once | INTRAVESICAL | Status: AC
  Administered 2024-01-07: 81 mg via INTRAVESICAL

## 2024-01-07 NOTE — Progress Notes (Signed)
 Assessment: 1. Malignant neoplasm of urinary bladder, unspecified site [facility]); high grade, Ta; s/p induction BCG   2. Encounter for antineoplastic immunotherapy      Plan: BCG #3/3 given today. I discussed the management with the patient and his daughter today. Return to office in 6-8 weeks for surveillance cystoscopy.   Chief Complaint: Bladder cancer  HPI: Stephen Hendrix is a 89 y.o. male who presents for continued evaluation of high risk nonmuscle of bladder cancer.  Patient is accompanied today by his wife and daughter.  His recent follow-up cystoscopy after induction BCG showed no endoscopic evidence of recurrence.  His cytology however did show dysplasia and a positive FISH.  Clinically he is doing very well.  No GU complaints.   He presents today for maintenance BCG therapy #3/3. He is doing well.  No new lower urinary tract symptoms.  No dysuria or gross hematuria.  Portions of the above documentation were copied from a prior visit for review purposes only.  Allergies: Allergies  Allergen Reactions   Corticosteroids Shortness Of Breath    HICCUPS AND RESPIRATORY DISTRESS   Methylprednisolone  Shortness Of Breath    Pt tolerates po steroids   Other Anaphylaxis    IV steroid   Penicillins Rash and Other (See Comments)        Avocado     Other reaction(s): Other (See Comments) ORAL PEELING   Macrobid [Nitrofurantoin]     Rash    Cortisone     Hiccups for a week    PMH: Past Medical History:  Diagnosis Date   Anemia    Atrial fibrillation (HCC)    Cancer (HCC)    Complication of anesthesia    Small airway, vocal cord damaged   Hypertension    Pneumonia    TIA (transient ischemic attack)    Whooping cough     PSH: Past Surgical History:  Procedure Laterality Date   BLADDER SURGERY     CHOLECYSTECTOMY     CYSTOSCOPY W/ RETROGRADES Bilateral 07/01/2023   Procedure: CYSTOSCOPY WITH RETROGRADE PYELOGRAM;  Surgeon: Scarlet Curly, MD;  Location: [facility];  Service: Urology;  Laterality: Bilateral;   EYE SURGERY     HERNIA REPAIR     TONSILLECTOMY      SH: Social History   Tobacco Use   Smoking status: Never   Smokeless tobacco: Never  Vaping Use   Vaping status: Never Used  Substance Use Topics   Alcohol use: Yes    Alcohol/week: 2.0 standard drinks of alcohol    Types: 2 Glasses of wine per week    Comment: Wine   Drug use: No    ROS: Constitutional:  Negative for fever, chills, weight loss CV: Negative for chest pain, previous MI, hypertension Respiratory:  Negative for shortness of breath, wheezing, sleep apnea, frequent cough GI:  Negative for nausea, vomiting, bloody stool, GERD  PE:  GENERAL APPEARANCE:  Well appearing, well developed, well nourished, NAD HEENT:  Atraumatic, normocephalic, oropharynx clear NECK:  Supple without lymphadenopathy or thyromegaly ABDOMEN:  Soft, non-tender, no masses EXTREMITIES:  Moves all extremities well, without clubbing, cyanosis, or edema NEUROLOGIC:  Alert and oriented x 3, normal gait, CN II-XII grossly intact MENTAL STATUS:  appropriate BACK:  Non-tender to palpation, No CVAT SKIN:  Warm, dry, and intact  Results: Results for orders placed or performed in visit on 01/07/24 (from the past 24 hours)  Urinalysis, Routine w reflex microscopic     Status: Abnormal   Collection  Time: 01/07/24 12:00 AM  Result Value Ref Range   Specific Gravity, UA 1.025 1.005 - 1.030   pH, UA 5.0 5.0 - 7.5   Color, UA Yellow Yellow   Appearance Ur Clear Clear   Leukocytes,UA 2+ (A) Negative   Protein,UA 1+ (A) Negative/Trace   Glucose, UA Negative Negative   Ketones, UA Trace (A) Negative   RBC, UA Trace (A) Negative   Bilirubin, UA Negative Negative   Urobilinogen, Ur 0.2 0.2 - 1.0 mg/dL   Nitrite, UA Negative Negative   Microscopic Examination See below:    Narrative   Performed at:  01 - [email or web address]  Urology [facility] [address], [place], [place]   [id number] Lab Director: Estill Hemming [place], Phone:  [phone]  Microscopic Examination     Status: Abnormal   Collection Time: 01/07/24 12:00 AM   Urine  Result Value Ref Range   WBC, UA >30 (A) 0 - 5 /hpf   RBC, Urine 0-2 0 - 2 /hpf   Epithelial Cells (non renal) 0-10 0 - 10 /hpf   Bacteria, UA Few None seen/Few   Narrative   Performed at:  01 [facility]  Urology [facility] [address], [place], [place]  [id number] Lab Director: Estill Hemming [place], Phone:  [phone]

## 2024-01-07 NOTE — Progress Notes (Signed)
 BCG Bladder Instillation  BCG # 3 of 3  Due to Bladder Cancer patient is present today for a BCG treatment. Patient was cleaned and prepped in a sterile fashion with betadine. A 14FR coude catheter was inserted, 50ml of reconstituted BCG was instilled into the bladder. The catheter was then removed. Patient tolerated well, no complications were noted  Performed by: Everlina Hock CMA & Chelsea LPN  Follow up/ Additional notes: RTC in 6 weeks for cystoscopy

## 2024-02-23 ENCOUNTER — Other Ambulatory Visit: Admitting: Urology

## 2024-02-24 ENCOUNTER — Encounter: Payer: Self-pay | Admitting: Urology

## 2024-02-24 ENCOUNTER — Ambulatory Visit (INDEPENDENT_AMBULATORY_CARE_PROVIDER_SITE_OTHER): Admitting: Urology

## 2024-02-24 VITALS — BP 170/88 | HR 65 | Ht 63.0 in | Wt 150.0 lb

## 2024-02-24 DIAGNOSIS — C679 Malignant neoplasm of bladder, unspecified: Secondary | ICD-10-CM | POA: Diagnosis not present

## 2024-02-24 DIAGNOSIS — Z8551 Personal history of malignant neoplasm of bladder: Secondary | ICD-10-CM

## 2024-02-24 DIAGNOSIS — N401 Enlarged prostate with lower urinary tract symptoms: Secondary | ICD-10-CM

## 2024-02-24 LAB — URINALYSIS, ROUTINE W REFLEX MICROSCOPIC
Bilirubin, UA: NEGATIVE
Glucose, UA: NEGATIVE
Ketones, UA: NEGATIVE
Nitrite, UA: NEGATIVE
RBC, UA: NEGATIVE
Specific Gravity, UA: 1.025 (ref 1.005–1.030)
Urobilinogen, Ur: 0.2 mg/dL (ref 0.2–1.0)
pH, UA: 5.5 (ref 5.0–7.5)

## 2024-02-24 LAB — MICROSCOPIC EXAMINATION

## 2024-02-24 MED ORDER — CIPROFLOXACIN HCL 500 MG PO TABS
500.0000 mg | ORAL_TABLET | Freq: Once | ORAL | Status: AC
  Administered 2024-02-24: 500 mg via ORAL

## 2024-02-24 NOTE — Progress Notes (Signed)
 Assessment: 1. Malignant neoplasm of urinary bladder, unspecified site ); high grade, Ta; s/p induction BCG 1/25; on maintenance BCG   2. Benign localized prostatic hyperplasia with lower urinary tract symptoms (LUTS)    Plan: Urine cytology sent today Cipro x 1 following cystoscopy Return to office for maintenance BCG weekly x 3 weeks in 6 weeks.  Chief Complaint: Bladder cancer  HPI: Stephen Hendrix is a 89 y.o. male who presents for continued evaluation of high risk nonmuscle of bladder cancer.   He underwent cystoscopy with TURBT by Dr. Shona in November 2024.  At that time he had 2 small low-grade appearing papillary tumors distal and to the right of the right UO and heaped up mucosa inside the bladder neck. 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 He completed induction BCG in January 2025. Surveillance cystoscopy in March 2025  showed no endoscopic evidence of recurrence.  His cytology however did show dysplasia and a positive FISH.   Clinically he has done very well.    He completed maintenance BCG therapy #3/3 on 01/07/24. He was doing well.  No new lower urinary tract symptoms.  No dysuria or gross hematuria.  He presents today for surveillance cystoscopy. No new lower urinary tract symptoms.  No dysuria or gross hematuria. IPSS = 1/0.  Portions of the above documentation were copied from a prior visit for review purposes only.  Allergies: Allergies  Allergen Reactions   Corticosteroids Shortness Of Breath    HICCUPS AND RESPIRATORY DISTRESS   Methylprednisolone  Shortness Of Breath    Pt tolerates po steroids   Other Anaphylaxis    IV steroid   Penicillins Rash and Other (See Comments)        Avocado     Other reaction(s): Other (See Comments) ORAL PEELING   Macrobid [Nitrofurantoin]     Rash    Cortisone     Hiccups for a week    PMH: Past Medical History:  Diagnosis Date    Anemia    Atrial fibrillation (HCC)    Cancer (HCC)    Complication of anesthesia    Small airway, vocal cord damaged   Hypertension    Pneumonia    TIA (transient ischemic attack)    Whooping cough     PSH: Past Surgical History:  Procedure Laterality Date   BLADDER SURGERY     CHOLECYSTECTOMY     CYSTOSCOPY W/ RETROGRADES Bilateral 07/01/2023   Procedure: CYSTOSCOPY WITH RETROGRADE PYELOGRAM;  Surgeon: Shona Layman BROCKS, MD;  Location: [facility];  Service: Urology;  Laterality: Bilateral;   EYE SURGERY     HERNIA REPAIR     TONSILLECTOMY      SH: Social History   Tobacco Use   Smoking status: Never   Smokeless tobacco: Never  Vaping Use   Vaping status: Never Used  Substance Use Topics   Alcohol use: Yes    Alcohol/week: 2.0 standard drinks of alcohol    Types: 2 Glasses of wine per week    Comment: Wine   Drug use: No    ROS: Constitutional:  Negative for fever, chills, weight loss CV: Negative for chest pain, previous MI, hypertension Respiratory:  Negative for shortness of breath, wheezing, sleep apnea, frequent cough GI:  Negative for nausea, vomiting, bloody stool, GERD  PE: BP (!) 170/88   Pulse 65   Ht 5' 3 (1.6 m)   Wt 150 lb (68 kg)   BMI 26.57 kg/m  GENERAL APPEARANCE:  Well appearing, well developed, well nourished, NAD HEENT:  Atraumatic, normocephalic, oropharynx clear NECK:  Supple without lymphadenopathy or thyromegaly ABDOMEN:  Soft, non-tender, no masses EXTREMITIES:  Moves all extremities well, without clubbing, cyanosis, or edema NEUROLOGIC:  Alert and oriented x 3, normal gait, CN II-XII grossly intact MENTAL STATUS:  appropriate BACK:  Non-tender to palpation, No CVAT SKIN:  Warm, dry, and intact  Results: U/A:  0-5 WBC, 0-2 RBC  Procedure:  Flexible Cystourethroscopy  Pre-operative Diagnosis: Bladder cancer surveillance  Post-operative Diagnosis: Bladder cancer surveillance  Anesthesia:  local with lidocaine  jelly  Surgical  Narrative:  After appropriate informed consent was obtained, the patient was prepped and draped in the usual sterile fashion in the supine position.  The patient was correctly identified and the proper procedure delineated prior to proceeding.  Sterile lidocaine  gel was instilled in the urethra. The flexible cystoscope was introduced without difficulty.  Findings:  Anterior urethra: Normal  Posterior urethra: Lateral lobe hypertrophy  Bladder: trabeculations, cellules; no mucosal lesions seen  Ureteral orifices: normal  Additional findings: none  Saline bladder wash for cytology was performed.    The cystoscope was then removed.  The patient tolerated the procedure well.

## 2024-03-03 ENCOUNTER — Encounter: Payer: Self-pay | Admitting: Urology

## 2024-04-06 ENCOUNTER — Encounter: Payer: Self-pay | Admitting: Urology

## 2024-04-06 ENCOUNTER — Ambulatory Visit: Admitting: Urology

## 2024-04-06 DIAGNOSIS — C679 Malignant neoplasm of bladder, unspecified: Secondary | ICD-10-CM | POA: Diagnosis not present

## 2024-04-06 DIAGNOSIS — R8290 Unspecified abnormal findings in urine: Secondary | ICD-10-CM

## 2024-04-06 LAB — MICROSCOPIC EXAMINATION: WBC, UA: >30 /HPF — AB (ref 0–5)

## 2024-04-06 LAB — URINALYSIS, ROUTINE W REFLEX MICROSCOPIC
Bilirubin, UA: POSITIVE — AB
Glucose, UA: NEGATIVE
Nitrite, UA: NEGATIVE
Specific Gravity, UA: 1.02 (ref 1.005–1.030)
Urobilinogen, Ur: 0.2 mg/dL (ref 0.2–1.0)
pH, UA: 5.5 (ref 5.0–7.5)

## 2024-04-06 MED ORDER — SULFAMETHOXAZOLE-TRIMETHOPRIM 800-160 MG PO TABS: 1.0000 | ORAL_TABLET | Freq: Two times a day (BID) | ORAL | 0 refills | Status: DC

## 2024-04-06 NOTE — Progress Notes (Signed)
  Assessment: 1. Malignant neoplasm of urinary bladder, unspecified site [facility])    Plan: Given in abnormalities seen on urinalysis, will hold on BCG today. Urine culture sent. Bactrim  DS twice daily x 7 days. Keep appointment for BCG next week.  Chief Complaint: Bladder cancer  HPI: Stephen Hendrix is a 89 y.o. male who presents for continued evaluation of high risk nonmuscle of bladder cancer.   He underwent cystoscopy with TURBT by Dr. Shona in November 2024.  At that time he had 2 small low-grade appearing papillary tumors distal and to the right of the right UO and heaped up mucosa inside the bladder neck. 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 He completed induction BCG in January 2025. Surveillance cystoscopy in March 2025  showed no endoscopic evidence of recurrence.  His cytology however did show dysplasia and a positive FISH.   Clinically he has done very well.    He completed maintenance BCG therapy #3/3 on 01/07/24. He was doing well.  No new lower urinary tract symptoms.  No dysuria or gross hematuria.  Surveillance cystoscopy from 02/24/2024 showed no recurrence within the bladder. Urine cytology was negative for malignant cells.  He presents today for maintenance BCG #1/3. He is not having any dysuria or gross hematuria.  Portions of the above documentation were copied from a prior visit for review purposes only.  Allergies: Allergies  Allergen Reactions   Corticosteroids Shortness Of Breath    HICCUPS AND RESPIRATORY DISTRESS   Methylprednisolone  Shortness Of Breath    Pt tolerates po steroids   Other Anaphylaxis    IV steroid   Penicillins Rash and Other (See Comments)        Avocado     Other reaction(s): Other (See Comments) ORAL PEELING   Macrobid [Nitrofurantoin]     Rash    Cortisone     Hiccups for a week    PMH: Past Medical History:  Diagnosis Date   Anemia    Atrial  fibrillation (HCC)    Cancer (HCC)    Complication of anesthesia    Small airway, vocal cord damaged   Hypertension    Pneumonia    TIA (transient ischemic attack)    Whooping cough     PSH: Past Surgical History:  Procedure Laterality Date   BLADDER SURGERY     CHOLECYSTECTOMY     CYSTOSCOPY W/ RETROGRADES Bilateral 07/01/2023   Procedure: CYSTOSCOPY WITH RETROGRADE PYELOGRAM;  Surgeon: Shona Layman BROCKS, MD;  Location: [facility];  Service: Urology;  Laterality: Bilateral;   EYE SURGERY     HERNIA REPAIR     TONSILLECTOMY      SH: Social History   Tobacco Use   Smoking status: Never   Smokeless tobacco: Never  Vaping Use   Vaping status: Never Used  Substance Use Topics   Alcohol use: Yes    Alcohol/week: 2.0 standard drinks of alcohol    Types: 2 Glasses of wine per week    Comment: Wine   Drug use: No    ROS: Constitutional:  Negative for fever, chills, weight loss CV: Negative for chest pain, previous MI, hypertension Respiratory:  Negative for shortness of breath, wheezing, sleep apnea, frequent cough GI:  Negative for nausea, vomiting, bloody stool, GERD  PE: GENERAL APPEARANCE:  Well appearing, well developed, well nourished, NAD  Results: U/A: >30 WBC, 3-10 RBC, many bacteria, nitrite negative

## 2024-04-08 ENCOUNTER — Ambulatory Visit: Payer: Self-pay | Admitting: Urology

## 2024-04-08 LAB — URINE CULTURE

## 2024-04-13 ENCOUNTER — Ambulatory Visit: Admitting: Urology

## 2024-04-13 ENCOUNTER — Encounter: Payer: Self-pay | Admitting: Urology

## 2024-04-13 DIAGNOSIS — Z5112 Encounter for antineoplastic immunotherapy: Secondary | ICD-10-CM

## 2024-04-13 DIAGNOSIS — C679 Malignant neoplasm of bladder, unspecified: Secondary | ICD-10-CM

## 2024-04-13 LAB — URINALYSIS, ROUTINE W REFLEX MICROSCOPIC
Bilirubin, UA: NEGATIVE
Glucose, UA: NEGATIVE
Ketones, UA: NEGATIVE
Nitrite, UA: NEGATIVE
Protein,UA: NEGATIVE
RBC, UA: NEGATIVE
Specific Gravity, UA: 1.015 (ref 1.005–1.030)
Urobilinogen, Ur: 0.2 mg/dL (ref 0.2–1.0)
pH, UA: 6.5 (ref 5.0–7.5)

## 2024-04-13 LAB — MICROSCOPIC EXAMINATION

## 2024-04-13 MED ORDER — BCG LIVE 50 MG IS SUSR
3.2400 mL | Freq: Once | INTRAVESICAL | Status: AC
  Administered 2024-04-13: 81 mg via INTRAVESICAL

## 2024-04-13 NOTE — Progress Notes (Signed)
 Assessment: 1. Malignant neoplasm of urinary bladder, unspecified site (HCC)   2. Encounter for antineoplastic immunotherapy     Plan: BCG # 1/3 given today Keep appointment for BCG #2/3 next week.  Chief Complaint: Bladder cancer  HPI: Stephen Hendrix is a 89 y.o. male who presents for continued evaluation of high risk nonmuscle of bladder cancer.   He underwent cystoscopy with TURBT by Dr. Shona in November 2024.  At that time he had 2 small low-grade appearing papillary tumors distal and to the right of the right UO and heaped up mucosa inside the bladder neck. 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 He completed induction BCG in January 2025. Surveillance cystoscopy in March 2025  showed no endoscopic evidence of recurrence.  His cytology however did show dysplasia and a positive FISH.   Clinically he has done very well.    He completed maintenance BCG therapy #3/3 on 01/07/24. He was doing well.  No new lower urinary tract symptoms.  No dysuria or gross hematuria.  Surveillance cystoscopy from 02/24/2024 showed no recurrence within the bladder. Urine cytology was negative for malignant cells. He presented for BCG on 04/06/2024.  His urinalysis showed significant pyuria.  Urine culture was sent which grew 50-100 K mixed flora.  He was treated with Bactrim  x 7 days.  He presents today for maintenance BCG #1/3. He is not having any dysuria or gross hematuria.  Portions of the above documentation were copied from a prior visit for review purposes only.  Allergies: Allergies  Allergen Reactions   Corticosteroids Shortness Of Breath    HICCUPS AND RESPIRATORY DISTRESS   Methylprednisolone  Shortness Of Breath    Pt tolerates po steroids   Other Anaphylaxis    IV steroid   Penicillins Rash and Other (See Comments)        Avocado     Other reaction(s): Other (See Comments) ORAL PEELING   Macrobid  [Nitrofurantoin]     Rash    Cortisone     Hiccups for a week    PMH: Past Medical History:  Diagnosis Date   Anemia    Atrial fibrillation (HCC)    Cancer (HCC)    Complication of anesthesia    Small airway, vocal cord damaged   Hypertension    Pneumonia    TIA (transient ischemic attack)    Whooping cough     PSH: Past Surgical History:  Procedure Laterality Date   BLADDER SURGERY     CHOLECYSTECTOMY     CYSTOSCOPY W/ RETROGRADES Bilateral 07/01/2023   Procedure: CYSTOSCOPY WITH RETROGRADE PYELOGRAM;  Surgeon: Shona Layman BROCKS, MD;  Location: [facility];  Service: Urology;  Laterality: Bilateral;   EYE SURGERY     HERNIA REPAIR     TONSILLECTOMY      SH: Social History   Tobacco Use   Smoking status: Never   Smokeless tobacco: Never  Vaping Use   Vaping status: Never Used  Substance Use Topics   Alcohol use: Yes    Alcohol/week: 2.0 standard drinks of alcohol    Types: 2 Glasses of wine per week    Comment: Wine   Drug use: No    ROS: Constitutional:  Negative for fever, chills, weight loss CV: Negative for chest pain, previous MI, hypertension Respiratory:  Negative for shortness of breath, wheezing, sleep apnea, frequent cough GI:  Negative for nausea, vomiting, bloody stool, GERD  PE: GENERAL APPEARANCE:  Well appearing, well developed, well nourished,  NAD  Results: U/A: 6-10 WBCs, 0-2 RBCs

## 2024-04-13 NOTE — Progress Notes (Signed)
 BCG Bladder Instillation  mBCG # 1 of 3  Due to Bladder Cancer patient is present today for a BCG treatment. Patient was cleaned and prepped in a sterile fashion with betadine. A 14FR coude catheter was inserted, urine return was noted 50mL, urine was yellow in color.  50mL of reconstituted BCG was instilled into the bladder. The catheter was then removed. Patient tolerated well, no complications were noted  Performed by: Mitzie LPN & Elynor Kallenberger CMA  Follow up/ Additional notes: RTC in 1 week for mBCG #2.

## 2024-04-20 ENCOUNTER — Ambulatory Visit: Admitting: Urology

## 2024-04-20 ENCOUNTER — Encounter: Payer: Self-pay | Admitting: Urology

## 2024-04-20 DIAGNOSIS — Z5112 Encounter for antineoplastic immunotherapy: Secondary | ICD-10-CM | POA: Diagnosis not present

## 2024-04-20 DIAGNOSIS — C679 Malignant neoplasm of bladder, unspecified: Secondary | ICD-10-CM | POA: Diagnosis not present

## 2024-04-20 LAB — URINALYSIS, ROUTINE W REFLEX MICROSCOPIC
Bilirubin, UA: NEGATIVE
Glucose, UA: NEGATIVE
Ketones, UA: NEGATIVE
Nitrite, UA: NEGATIVE
RBC, UA: NEGATIVE
Specific Gravity, UA: 1.025 (ref 1.005–1.030)
Urobilinogen, Ur: 0.2 mg/dL (ref 0.2–1.0)
pH, UA: 5.5 (ref 5.0–7.5)

## 2024-04-20 LAB — MICROSCOPIC EXAMINATION
Casts: POSITIVE /LPF — AB
Mucus, UA: POSITIVE — AB

## 2024-04-20 MED ORDER — BCG LIVE 50 MG IS SUSR
3.2400 mL | Freq: Once | INTRAVESICAL | Status: AC
  Administered 2024-04-20: 81 mg via INTRAVESICAL

## 2024-04-20 NOTE — Progress Notes (Signed)
 BCG Bladder Instillation  mBCG # 2 of 3  Due to Bladder Cancer patient is present today for a BCG treatment. Patient was cleaned and prepped in a sterile fashion with betadine. A 14FR coude catheter was inserted, urine return was noted 20mL, urine was yellow in color.  50mL of reconstituted BCG was instilled into the bladder. The catheter was then removed. Patient tolerated well, no complications were noted  Performed by: Roselee CMA & [facility] LPN

## 2024-04-20 NOTE — Progress Notes (Signed)
 Assessment: 1. Malignant neoplasm of urinary bladder, unspecified site (HCC)   2. Encounter for antineoplastic immunotherapy     Plan: BCG # 2/3 given today Keep appointment for BCG #3/3 next week.  Chief Complaint: Bladder cancer  HPI: Stephen Hendrix is a 89 y.o. male who presents for continued evaluation of high risk nonmuscle of bladder cancer.   He underwent cystoscopy with TURBT by Dr. Shona in November 2024.  At that time he had 2 small low-grade appearing papillary tumors distal and to the right of the right UO and heaped up mucosa inside the bladder neck. 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 He completed induction BCG in January 2025. Surveillance cystoscopy in March 2025  showed no endoscopic evidence of recurrence.  His cytology however did show dysplasia and a positive FISH.   Clinically he has done very well.    He completed maintenance BCG therapy #3/3 on 01/07/24. He was doing well.  No new lower urinary tract symptoms.  No dysuria or gross hematuria.  Surveillance cystoscopy from 02/24/2024 showed no recurrence within the bladder. Urine cytology was negative for malignant cells. He presented for BCG on 04/06/2024.  His urinalysis showed significant pyuria.  Urine culture was sent which grew 50-100 K mixed flora.  He was treated with Bactrim  x 7 days.  He presents today for maintenance BCG #2/3. He is not having any dysuria or gross hematuria.  Portions of the above documentation were copied from a prior visit for review purposes only.  Allergies: Allergies  Allergen Reactions   Corticosteroids Shortness Of Breath    HICCUPS AND RESPIRATORY DISTRESS   Methylprednisolone  Shortness Of Breath    Pt tolerates po steroids   Other Anaphylaxis    IV steroid   Penicillins Rash and Other (See Comments)        Avocado     Other reaction(s): Other (See Comments) ORAL PEELING   Macrobid  [Nitrofurantoin]     Rash    Cortisone     Hiccups for a week    PMH: Past Medical History:  Diagnosis Date   Anemia    Atrial fibrillation (HCC)    Cancer (HCC)    Complication of anesthesia    Small airway, vocal cord damaged   Hypertension    Pneumonia    TIA (transient ischemic attack)    Whooping cough     PSH: Past Surgical History:  Procedure Laterality Date   BLADDER SURGERY     CHOLECYSTECTOMY     CYSTOSCOPY W/ RETROGRADES Bilateral 07/01/2023   Procedure: CYSTOSCOPY WITH RETROGRADE PYELOGRAM;  Surgeon: Shona Layman BROCKS, MD;  Location: [facility];  Service: Urology;  Laterality: Bilateral;   EYE SURGERY     HERNIA REPAIR     TONSILLECTOMY      SH: Social History   Tobacco Use   Smoking status: Never   Smokeless tobacco: Never  Vaping Use   Vaping status: Never Used  Substance Use Topics   Alcohol use: Yes    Alcohol/week: 2.0 standard drinks of alcohol    Types: 2 Glasses of wine per week    Comment: Wine   Drug use: No    ROS: Constitutional:  Negative for fever, chills, weight loss CV: Negative for chest pain, previous MI, hypertension Respiratory:  Negative for shortness of breath, wheezing, sleep apnea, frequent cough GI:  Negative for nausea, vomiting, bloody stool, GERD  PE: GENERAL APPEARANCE:  Well appearing, well developed, well nourished,  NAD  Results: U/A: 11-30 WBCs, 0-2 RBCs

## 2024-04-28 ENCOUNTER — Ambulatory Visit: Admitting: Urology

## 2024-04-28 ENCOUNTER — Encounter: Payer: Self-pay | Admitting: Urology

## 2024-04-28 DIAGNOSIS — C679 Malignant neoplasm of bladder, unspecified: Secondary | ICD-10-CM

## 2024-04-28 DIAGNOSIS — R8290 Unspecified abnormal findings in urine: Secondary | ICD-10-CM | POA: Diagnosis not present

## 2024-04-28 LAB — URINALYSIS, ROUTINE W REFLEX MICROSCOPIC
Bilirubin, UA: NEGATIVE
Glucose, UA: NEGATIVE
Nitrite, UA: POSITIVE — AB
Specific Gravity, UA: 1.025 (ref 1.005–1.030)
Urobilinogen, Ur: 0.2 mg/dL (ref 0.2–1.0)
pH, UA: 5.5 (ref 5.0–7.5)

## 2024-04-28 LAB — MICROSCOPIC EXAMINATION
Casts: POSITIVE /LPF — AB
Mucus, UA: POSITIVE — AB
WBC, UA: >30 /HPF — AB (ref 0–5)

## 2024-04-28 MED ORDER — SULFAMETHOXAZOLE-TRIMETHOPRIM 800-160 MG PO TABS: 1.0000 | ORAL_TABLET | Freq: Two times a day (BID) | ORAL | 0 refills | Status: AC

## 2024-04-28 MED ORDER — BCG LIVE 50 MG IS SUSR: 3.2400 mL | Freq: Once | INTRAVESICAL | Status: DC

## 2024-04-28 NOTE — Progress Notes (Signed)
 Assessment: 1. Malignant neoplasm of urinary bladder, unspecified site (HCC)   2. Abnormal urine findings     Plan: Hold BCG # 3/3 today due to abnormal U/A Resolve Mdx culture sent. Begin Bactrim  DS BID x 7 days Return in 7-10 days for next BCG  Chief Complaint: Bladder cancer  HPI: Stephen Hendrix is a 89 y.o. male who presents for continued evaluation of high risk nonmuscle of bladder cancer.   He underwent cystoscopy with TURBT by Dr. Shona in November 2024.  At that time he had 2 small low-grade appearing papillary tumors distal and to the right of the right UO and heaped up mucosa inside the bladder neck. 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 He completed induction BCG in January 2025. Surveillance cystoscopy in March 2025  showed no endoscopic evidence of recurrence.  His cytology however did show dysplasia and a positive FISH.   Clinically he has done very well.    He completed maintenance BCG therapy #3/3 on 01/07/24. He was doing well.  No new lower urinary tract symptoms.  No dysuria or gross hematuria.  Surveillance cystoscopy from 02/24/2024 showed no recurrence within the bladder. Urine cytology was negative for malignant cells. He presented for BCG on 04/06/2024.  His urinalysis showed significant pyuria.  Urine culture was sent which grew 50-100 K mixed flora.  He was treated with Bactrim  x 7 days.  He presents today for maintenance BCG #3/3. He is not having any dysuria or gross hematuria.  Portions of the above documentation were copied from a prior visit for review purposes only.  Allergies: Allergies  Allergen Reactions   Corticosteroids Shortness Of Breath    HICCUPS AND RESPIRATORY DISTRESS   Methylprednisolone  Shortness Of Breath    Pt tolerates po steroids   Other Anaphylaxis    IV steroid   Penicillins Rash and Other (See Comments)        Avocado     Other reaction(s): Other  (See Comments) ORAL PEELING   Macrobid [Nitrofurantoin]     Rash    Cortisone     Hiccups for a week    PMH: Past Medical History:  Diagnosis Date   Anemia    Atrial fibrillation (HCC)    Cancer (HCC)    Complication of anesthesia    Small airway, vocal cord damaged   Hypertension    Pneumonia    TIA (transient ischemic attack)    Whooping cough     PSH: Past Surgical History:  Procedure Laterality Date   BLADDER SURGERY     CHOLECYSTECTOMY     CYSTOSCOPY W/ RETROGRADES Bilateral 07/01/2023   Procedure: CYSTOSCOPY WITH RETROGRADE PYELOGRAM;  Surgeon: Shona Layman BROCKS, MD;  Location: [facility];  Service: Urology;  Laterality: Bilateral;   EYE SURGERY     HERNIA REPAIR     TONSILLECTOMY      SH: Social History   Tobacco Use   Smoking status: Never   Smokeless tobacco: Never  Vaping Use   Vaping status: Never Used  Substance Use Topics   Alcohol use: Yes    Alcohol/week: 2.0 standard drinks of alcohol    Types: 2 Glasses of wine per week    Comment: Wine   Drug use: No    ROS: Constitutional:  Negative for fever, chills, weight loss CV: Negative for chest pain, previous MI, hypertension Respiratory:  Negative for shortness of breath, wheezing, sleep apnea, frequent cough GI:  Negative for nausea, vomiting,  bloody stool, GERD  PE: GENERAL APPEARANCE:  Well appearing, well developed, well nourished, NAD  Results: U/A: >30 WBCs, 3-10 RBCs, few bacteria, nitrite positive

## 2024-05-01 ENCOUNTER — Other Ambulatory Visit: Payer: Self-pay | Admitting: Urology

## 2024-05-01 ENCOUNTER — Encounter: Payer: Self-pay | Admitting: Urology

## 2024-05-01 DIAGNOSIS — R8290 Unspecified abnormal findings in urine: Secondary | ICD-10-CM

## 2024-05-01 MED ORDER — LINEZOLID 600 MG PO TABS: 600.0000 mg | ORAL_TABLET | Freq: Two times a day (BID) | ORAL | 0 refills | Status: AC

## 2024-05-03 ENCOUNTER — Encounter: Payer: Self-pay | Admitting: Urology

## 2024-05-05 ENCOUNTER — Other Ambulatory Visit: Payer: Self-pay

## 2024-05-05 ENCOUNTER — Encounter (HOSPITAL_BASED_OUTPATIENT_CLINIC_OR_DEPARTMENT_OTHER): Payer: Self-pay

## 2024-05-05 ENCOUNTER — Emergency Department (HOSPITAL_BASED_OUTPATIENT_CLINIC_OR_DEPARTMENT_OTHER)
Admission: EM | Admit: 2024-05-05 | Discharge: 2024-05-05 | Disposition: A | Discharge status: Home / Self Care | Attending: Emergency Medicine | Admitting: Emergency Medicine

## 2024-05-05 DIAGNOSIS — Z79899 Other long term (current) drug therapy: Secondary | ICD-10-CM | POA: Insufficient documentation

## 2024-05-05 DIAGNOSIS — N189 Chronic kidney disease, unspecified: Secondary | ICD-10-CM | POA: Insufficient documentation

## 2024-05-05 DIAGNOSIS — I4891 Unspecified atrial fibrillation: Secondary | ICD-10-CM | POA: Insufficient documentation

## 2024-05-05 DIAGNOSIS — I130 Hypertensive heart and chronic kidney disease with heart failure and stage 1 through stage 4 chronic kidney disease, or unspecified chronic kidney disease: Secondary | ICD-10-CM | POA: Insufficient documentation

## 2024-05-05 DIAGNOSIS — Z8673 Personal history of transient ischemic attack (TIA), and cerebral infarction without residual deficits: Secondary | ICD-10-CM | POA: Diagnosis not present

## 2024-05-05 DIAGNOSIS — I509 Heart failure, unspecified: Secondary | ICD-10-CM | POA: Insufficient documentation

## 2024-05-05 DIAGNOSIS — K521 Toxic gastroenteritis and colitis: Secondary | ICD-10-CM | POA: Diagnosis not present

## 2024-05-05 DIAGNOSIS — R7401 Elevation of levels of liver transaminase levels: Secondary | ICD-10-CM | POA: Insufficient documentation

## 2024-05-05 DIAGNOSIS — T65891A Toxic effect of other specified substances, accidental (unintentional), initial encounter: Secondary | ICD-10-CM | POA: Insufficient documentation

## 2024-05-05 DIAGNOSIS — Z8551 Personal history of malignant neoplasm of bladder: Secondary | ICD-10-CM | POA: Diagnosis not present

## 2024-05-05 DIAGNOSIS — R197 Diarrhea, unspecified: Secondary | ICD-10-CM | POA: Diagnosis present

## 2024-05-05 DIAGNOSIS — Z7901 Long term (current) use of anticoagulants: Secondary | ICD-10-CM | POA: Insufficient documentation

## 2024-05-05 LAB — COMPREHENSIVE METABOLIC PANEL WITH GFR
ALT: 58 U/L — ABNORMAL HIGH (ref 0–44)
AST: 42 U/L — ABNORMAL HIGH (ref 15–41)
Albumin: 4.3 g/dL (ref 3.5–5.0)
Alkaline Phosphatase: 127 U/L — ABNORMAL HIGH (ref 38–126)
Anion gap: 12 (ref 5–15)
BUN: 11 mg/dL (ref 8–23)
CO2: 26 mmol/L (ref 22–32)
Calcium: 9.5 mg/dL (ref 8.9–10.3)
Chloride: 102 mmol/L (ref 98–111)
Creatinine, Ser: 1.03 mg/dL (ref 0.61–1.24)
GFR, Estimated: >60 mL/min (ref >60)
Glucose, Bld: 155 mg/dL — ABNORMAL HIGH (ref 70–99)
Potassium: 4.5 mmol/L (ref 3.5–5.1)
Sodium: 139 mmol/L (ref 135–145)
Total Bilirubin: 0.9 mg/dL (ref 0.0–1.2)
Total Protein: 7.3 g/dL (ref 6.5–8.1)

## 2024-05-05 LAB — URINALYSIS, MICROSCOPIC (REFLEX)

## 2024-05-05 LAB — URINALYSIS, ROUTINE W REFLEX MICROSCOPIC
Bilirubin Urine: NEGATIVE
Glucose, UA: NEGATIVE mg/dL
Hgb urine dipstick: NEGATIVE
Ketones, ur: NEGATIVE mg/dL
Nitrite: NEGATIVE
Protein, ur: NEGATIVE mg/dL
Specific Gravity, Urine: 1.015 (ref 1.005–1.030)
pH: 5.5 (ref 5.0–8.0)

## 2024-05-05 LAB — LIPASE, BLOOD: Lipase: 47 U/L (ref 11–51)

## 2024-05-05 LAB — CBC
HCT: 50.3 % (ref 39.0–52.0)
Hemoglobin: 16.6 g/dL (ref 13.0–17.0)
MCH: 29.7 pg (ref 26.0–34.0)
MCHC: 33 g/dL (ref 30.0–36.0)
MCV: 90 fL (ref 80.0–100.0)
Platelets: 291 K/uL (ref 150–400)
RBC: 5.59 MIL/uL (ref 4.22–5.81)
RDW: 13.3 % (ref 11.5–15.5)
WBC: 10.4 K/uL (ref 4.0–10.5)
nRBC: 0 % (ref 0.0–0.2)

## 2024-05-05 MED ORDER — SODIUM CHLORIDE 0.9 % IV BOLUS
1000.0000 mL | Freq: Once | INTRAVENOUS | Status: AC
  Administered 2024-05-05: 1000 mL via INTRAVENOUS

## 2024-05-05 NOTE — ED Notes (Signed)
 Pt. Reports he is here due to he has had off and on diarrhea.

## 2024-05-05 NOTE — ED Triage Notes (Signed)
 Reports diarrhea for 1.5 months. Denies abd pain, N/V, urinary symptoms. Just finished abx for UTI.

## 2024-05-05 NOTE — Discharge Instructions (Addendum)
 Thank you for letting us  evaluate you today.  It appears that your diarrhea is likely related to antibiotic use.  We have given you 1 dose of fluids here in the Emergency Department for hydration.  We have sent your urine to lab for culture to see if this grows any infection.  They should take 24-36 hours to result.  Please make sure to take probiotics, remain adequately hydrated at home.  You may also use antidiarrheal such as Imodium at home to stop diarrhea.  Please follow-up with urology for urine culture results, further management. You may follow up with PCP regarding providing stool sample for PCP to rule out bacterial cause of diarrhea if you continue to have diarrhea following stopping antibiotic  You also have mildly elevated liver enzymes today.  Please follow-up with primary care doctor to trend these and ensure they do not get higher.  Please avoid alcohol, Tylenol  use over the next couple days.  Return to Emergency Department if you experience severe debilitating abdominal pain, vomiting, inability to pass gas, severe dehydration, significant worsening symptoms

## 2024-05-05 NOTE — ED Provider Notes (Signed)
 [facility] Provider Note   CSN: [id number] Arrival date & time: 05/05/24  1219     Patient presents with: Diarrhea   Stephen Hendrix is a 89 y.o. male with past medical history of A-fib, HTN, HLD, TIA, HF, CKD, QT prolongation, urinary bladder neoplasm presents to emergency department for evaluation of on and off diarrhea for past 1 month. Been consistent for past 7 days.  Diarrhea started following taking Bactrim  1 month ago when starting having routine UA at urology follow-up appointments for BCG vaccine for bladder neoplasm.  He was placed on Bactrim  again on 04/28/24 and took for three days until culture grew enterococcus faecilis so was placed on linezolid . Has taken three days of Linezolid  and was told to stop taking anti. Diarrhea has been worsening since starting linezolid  having 4-5 episodes of diarrhea over past four days.  No melena nor BRBPR.  Denies yellow-colored stool or foul odor.  Reports that he did travel to [place] last month and has been traveling every couple months for the past year.  Was recommended to take probiotic and stop Laniazid by urology office today due to diarrhea.  He took 1 probiotic today.  Denies cough, congestion, known sick contacts, suspicious foods, abdominal pain, N/V, urinary symptoms, flank pain, fever     Diarrhea      Prior to Admission medications   Medication Sig Start Date End Date Taking? Authorizing Provider  acetaminophen  (TYLENOL ) 650 MG CR tablet Take 650 mg by mouth at bedtime.    [provider]  amLODipine  (NORVASC ) 2.5 MG tablet TAKE 1 TABLET(2.5 MG) BY MOUTH DAILY 07/17/20   Dann Candyce RAMAN, MD  apixaban  (ELIQUIS ) 2.5 MG TABS tablet Take 1 tablet (2.5 mg total) by mouth 2 (two) times daily. 02/10/20   Dann Candyce RAMAN, MD  Ascorbic Acid  (VITAMIN C ) 1000 MG tablet Take 1,000 mg by mouth daily. Emergenc with Vit D    [provider]  atorvastatin   (LIPITOR) 40 MG tablet Take 1 tablet (40 mg total) by mouth daily. 02/10/20   Dann Candyce RAMAN, MD  candesartan (ATACAND) 4 MG tablet Take 4 mg by mouth daily. 03/18/23   [provider]  Dutasteride-Tamsulosin HCl 0.5-0.4 MG CAPS Take 1 capsule by mouth at bedtime. 03/12/21   [provider]  eplerenone  (INSPRA ) 25 MG tablet TOME UNA TABLETA DIARIAMENTE 07/10/20   Varanasi, Jayadeep S, MD  fluticasone (FLONASE) 50 MCG/ACT nasal spray Place 2 sprays into both nostrils at bedtime.    [provider]  gabapentin (NEURONTIN) 300 MG capsule Take 600 mg by mouth at bedtime. 04/07/23   [provider]  levothyroxine  (SYNTHROID ) 50 MCG tablet TOME UNA TABLETA DIARIAMENTE ANTES DEL DESAYUNO 01/12/21   Theophilus Andrews, Tully GRADE, MD  linezolid  (ZYVOX ) 600 MG tablet Take 1 tablet (600 mg total) by mouth 2 (two) times daily for 10 days. 05/01/24 05/11/24  Stoneking, Adine PARAS., MD  Multiple Vitamins-Minerals (MULTIVITAMIN GUMMIES ADULT) CHEW Chew 1 tablet by mouth daily.    [provider]  OVER THE COUNTER MEDICATION Take 2 tablets by mouth at bedtime. restful legs pm    [provider]  OVER THE COUNTER MEDICATION Take 30 mLs by mouth at bedtime. Tonic Water    [provider]  sulfamethoxazole -trimethoprim  (BACTRIM  DS) 800-160 MG tablet Take 1 tablet by mouth every 12 (twelve) hours. 04/28/24   Stoneking, Adine PARAS., MD  vitamin B-12 (CYANOCOBALAMIN ) 1000 MCG tablet Take 1,000 mcg  by mouth daily.    [provider]    Allergies: Corticosteroids, Methylprednisolone , Other, Penicillins, Avocado, Macrobid [nitrofurantoin], and Cortisone    Review of Systems  Gastrointestinal:  Positive for diarrhea.    Updated Vital Signs BP (!) 151/69   Pulse 73   Temp 97.6 F (36.4 C) (Oral)   Resp 18   SpO2 100%   Physical Exam Vitals and nursing note reviewed.  Constitutional:      General: He is not in acute distress.    Appearance: Normal  appearance.  HENT:     Head: Normocephalic and atraumatic.  Eyes:     Conjunctiva/sclera: Conjunctivae normal.  Cardiovascular:     Rate and Rhythm: Normal rate.  Pulmonary:     Effort: Pulmonary effort is normal. No respiratory distress.     Breath sounds: Normal breath sounds.  Abdominal:     General: Bowel sounds are normal. There is no distension.     Palpations: Abdomen is soft.     Tenderness: There is no abdominal tenderness. There is no right CVA tenderness, left CVA tenderness, guarding or rebound.  Skin:    Coloration: Skin is not jaundiced or pale.  Neurological:     Mental Status: He is alert and oriented to person, place, and time. Mental status is at baseline.     (all labs ordered are listed, but only abnormal results are displayed) Labs Reviewed  COMPREHENSIVE METABOLIC PANEL WITH GFR - Abnormal; Notable for the following components:      Result Value   Glucose, Bld 155 (*)    AST 42 (*)    ALT 58 (*)    Alkaline Phosphatase 127 (*)    All other components within normal limits  URINALYSIS, ROUTINE W REFLEX MICROSCOPIC - Abnormal; Notable for the following components:   Leukocytes,Ua SMALL (*)    All other components within normal limits  URINALYSIS, MICROSCOPIC (REFLEX) - Abnormal; Notable for the following components:   Bacteria, UA RARE (*)    All other components within normal limits  URINE CULTURE  GASTROINTESTINAL PANEL BY PCR, STOOL (REPLACES STOOL CULTURE)  LIPASE, BLOOD  CBC    EKG: None  Radiology: No results found.   Medications Ordered in the ED  sodium chloride  0.9 % bolus 1,000 mL (0 mLs Intravenous Stopped 05/05/24 1608)                                    Medical Decision Making Amount and/or Complexity of Data Reviewed Labs: ordered.   Patient presents to the ED for concern of diarrhea, this involves an extensive number of treatment options, and is a complaint that carries with it a high risk of complications and morbidity.   The differential diagnosis includes C. difficile, bacterial diarrhea, electrolyte abnormality, intra-abdominal infection, antibiotic associated diarrhea   Co morbidities that complicate the patient evaluation  Has been on antibiotics intermittently over the past month   Additional history obtained:  Additional history obtained from Nursing and Outside Medical Records   External records from outside source obtained and reviewed including triage note, urology note   Lab Tests:  I Ordered, and personally interpreted labs.  The pertinent results include:   AST 42 ALT 58 ALP 127 UA positive for small leukocytes, WBC, rare bacteria Culture pending    Medicines ordered and prescription drug management:  I ordered medication including NS  for hydration  Reevaluation of the  patient after these medicines showed that the patient improved I have reviewed the patients home medicines and have made adjustments as needed    Problem List / ED Course:  Antibiotic-associated diarrhea Diarrhea started following first dose of Bactrim  1 month ago.  Since then, has had 3 days of Bactrim  and 4 days of linezolid  this week for UTI. No abdominal pain nor vomiting.  Is passing flatulence.  Low suspicion for obstruction, perforation No fever no tachycardia nor leukocytosis.  Low suspicion for sepsis or intra-abdominal infection No significant electrolyte abnormality requiring repletion UA shows small leukocytes, rare bacteria, 6-10 WBC.  Urine culture pending Last echo 2019 showed LVEF 65%-provided 1 L IVF in ED for hydration as he has been having several episodes of diarrhea Per urology, patient did not take antibiotic today due to diarrhea.  Has had 1 probiotic but has not tried any Imodium at home.  Did send urine and urine culture to see if the amount of antibiotic that he has had thus far has treated the UTI.  He can follow-up with urology regarding continuing antibiotic, urine culture  results Did order GI PCR, C. difficile PCR to rule out bacterial cause of diarrhea however patient was unable to provide stool sample while in ED.  Patient can follow-up with PCP regarding providing stool sample if diarrhea does not resolve following cessation of antibiotic Discussed symptomatic treatment at home to include adequate oral hydration, avoiding triggers of diarrhea, Imodium, probiotic  Transaminitis Mild  Discussed with patient to avoid Tylenol , EtOH use Can follow-up with PCP regarding trending these   Reevaluation:  After the interventions noted above, I reevaluated the patient and found that they have :stayed the same   Social Determinants of Health:  Has pcp   Dispostion:  After consideration of the diagnostic results and the patients response to treatment, I feel that the patent would benefit from outpatient management with urology follow-up, PCP follow-up, symptomatic care.   Discussed ED workup, disposition, return to ED precautions with patient who expresses understanding agrees with plan.  All questions answered to their satisfaction.  They are agreeable to plan.  Discharge instructions provided on paperwork  Final diagnoses:  Antibiotic-associated diarrhea  Transaminitis    ED Discharge Orders     None        Minnie Tinnie BRAVO, PA 05/05/24 1802    Mannie Pac T, DO 05/09/24 1517

## 2024-05-06 ENCOUNTER — Telehealth: Payer: Self-pay | Admitting: Urology

## 2024-05-06 ENCOUNTER — Ambulatory Visit: Admitting: Urology

## 2024-05-06 LAB — URINE CULTURE: Culture: NO GROWTH

## 2024-05-06 NOTE — Progress Notes (Deleted)
 Assessment: 1. Malignant neoplasm of urinary bladder, unspecified site (HCC)   2. Encounter for antineoplastic immunotherapy    Plan: BCG #3/3 given today. Schedule for cystoscopy in 6-8 weeks.  Chief Complaint: Bladder cancer  HPI: Stephen Hendrix is a 89 y.o. male who presents for continued evaluation of high risk nonmuscle of bladder cancer.   He underwent cystoscopy with TURBT by Dr. Shona in November 2024.  At that time he had 2 small low-grade appearing papillary tumors distal and to the right of the right UO and heaped up mucosa inside the bladder neck. 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 He completed induction BCG in January 2025. Surveillance cystoscopy in March 2025  showed no endoscopic evidence of recurrence.  His cytology however did show dysplasia and a positive FISH.   Clinically he has done very well.    He completed maintenance BCG therapy #3/3 on 01/07/24. He was doing well.  No new lower urinary tract symptoms.  No dysuria or gross hematuria.  Surveillance cystoscopy from 02/24/2024 showed no recurrence within the bladder. Urine cytology was negative for malignant cells. He presented for BCG on 04/06/2024.  His urinalysis showed significant pyuria.  Urine culture was sent which grew 50-100 K mixed flora.  He was treated with Bactrim  x 7 days. Urinalysis from 04/28/2024 showed >30 WBCs, 3-10 RBCs, and positive nitrite. Resolve MDX urine culture grew Enterococcus and Staphylococcus, sensitive only to Macrodantin and Zyvox .  Due to his allergy to Macrodantin, he was placed on Zyvox .  Unfortunately, he began to experience significant diarrhea which he associated with the antibiotics.  He was seen in the emergency room yesterday for the symptoms. Urinalysis from 05/05/2024 showed 0-5 RBCs, 6-10 WBCs, rare bacteria, nitrite negative. Cx pending.  He presents today for maintenance BCG #3/3. He is not  having any dysuria or gross hematuria.  Portions of the above documentation were copied from a prior visit for review purposes only.  Allergies: Allergies  Allergen Reactions   Corticosteroids Shortness Of Breath    HICCUPS AND RESPIRATORY DISTRESS   Methylprednisolone  Shortness Of Breath    Pt tolerates po steroids   Other Anaphylaxis    IV steroid   Penicillins Rash and Other (See Comments)        Avocado     Other reaction(s): Other (See Comments) ORAL PEELING   Macrobid [Nitrofurantoin]     Rash    Cortisone     Hiccups for a week    PMH: Past Medical History:  Diagnosis Date   Anemia    Atrial fibrillation (HCC)    Cancer (HCC)    Complication of anesthesia    Small airway, vocal cord damaged   Hypertension    Pneumonia    TIA (transient ischemic attack)    Whooping cough     PSH: Past Surgical History:  Procedure Laterality Date   BLADDER SURGERY     CHOLECYSTECTOMY     CYSTOSCOPY W/ RETROGRADES Bilateral 07/01/2023   Procedure: CYSTOSCOPY WITH RETROGRADE PYELOGRAM;  Surgeon: Shona Layman BROCKS, MD;  Location: [facility];  Service: Urology;  Laterality: Bilateral;   EYE SURGERY     HERNIA REPAIR     TONSILLECTOMY      SH: Social History   Tobacco Use   Smoking status: Never   Smokeless tobacco: Never  Vaping Use   Vaping status: Never Used  Substance Use Topics   Alcohol use: Yes    Alcohol/week: 2.0 standard drinks  of alcohol    Types: 2 Glasses of wine per week    Comment: Wine   Drug use: No    ROS: Constitutional:  Negative for fever, chills, weight loss CV: Negative for chest pain, previous MI, hypertension Respiratory:  Negative for shortness of breath, wheezing, sleep apnea, frequent cough GI:  Negative for nausea, vomiting, bloody stool, GERD  PE: GENERAL APPEARANCE:  Well appearing, well developed, well nourished, NAD  Results: U/A:

## 2024-05-06 NOTE — Telephone Encounter (Signed)
 Called patient to move appointment to tomorrow 09/12 at 10:45. I called and they said they would call us  back, just needed to confirm that tomorrow at that time was ok and he would be feeling better. Please sch patient when they call back for tomorrow at 10:45am if they are ok with that. If not, ask crysta about another date and time.

## 2024-05-07 ENCOUNTER — Encounter: Payer: Self-pay | Admitting: Urology

## 2024-05-07 ENCOUNTER — Ambulatory Visit: Admitting: Urology

## 2024-05-07 DIAGNOSIS — C679 Malignant neoplasm of bladder, unspecified: Secondary | ICD-10-CM

## 2024-05-07 DIAGNOSIS — Z5112 Encounter for antineoplastic immunotherapy: Secondary | ICD-10-CM | POA: Diagnosis not present

## 2024-05-07 LAB — URINALYSIS, ROUTINE W REFLEX MICROSCOPIC
Bilirubin, UA: NEGATIVE
Glucose, UA: NEGATIVE
Ketones, UA: NEGATIVE
Nitrite, UA: NEGATIVE
RBC, UA: NEGATIVE
Specific Gravity, UA: 1.01 (ref 1.005–1.030)
Urobilinogen, Ur: 0.2 mg/dL (ref 0.2–1.0)
pH, UA: 5.5 (ref 5.0–7.5)

## 2024-05-07 LAB — MICROSCOPIC EXAMINATION

## 2024-05-07 MED ORDER — BCG LIVE 50 MG IS SUSR
3.2400 mL | Freq: Once | INTRAVESICAL | Status: AC
  Administered 2024-05-07: 81 mg via INTRAVESICAL

## 2024-05-07 NOTE — Progress Notes (Signed)
 Assessment: 1. Malignant neoplasm of urinary bladder, unspecified site (HCC)   2. Encounter for antineoplastic immunotherapy     Plan: BCG #3/3 given today. Schedule for cystoscopy in 6-8 weeks.  Chief Complaint: Bladder cancer  HPI: Stephen Hendrix is a 89 y.o. male who presents for continued evaluation of high risk nonmuscle of bladder cancer.   He underwent cystoscopy with TURBT by Dr. Shona in November 2024.  At that time he had 2 small low-grade appearing papillary tumors distal and to the right of the right UO and heaped up mucosa inside the bladder neck. 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 He completed induction BCG in January 2025. Surveillance cystoscopy in March 2025  showed no endoscopic evidence of recurrence.  His cytology however did show dysplasia and a positive FISH.   Clinically he has done very well.    He completed maintenance BCG therapy #3/3 on 01/07/24. He was doing well.  No new lower urinary tract symptoms.  No dysuria or gross hematuria.  Surveillance cystoscopy from 02/24/2024 showed no recurrence within the bladder. Urine cytology was negative for malignant cells. He presented for BCG on 04/06/2024.  His urinalysis showed significant pyuria.  Urine culture was sent which grew 50-100 K mixed flora.  He was treated with Bactrim  x 7 days. Urinalysis from 04/28/2024 showed >30 WBCs, 3-10 RBCs, and positive nitrite. Resolve MDX urine culture grew Enterococcus and Staphylococcus, sensitive only to Macrodantin and Zyvox .  Due to his allergy to Macrodantin, he was placed on Zyvox .  Unfortunately, he began to experience significant diarrhea which he associated with the antibiotics.  He was seen in the emergency room yesterday for the symptoms. Urinalysis from 05/05/2024 showed 0-5 RBCs, 6-10 WBCs, rare bacteria, nitrite negative. Cx pending.  He presents today for maintenance BCG #3/3. He is not  having any dysuria or gross hematuria.  Portions of the above documentation were copied from a prior visit for review purposes only.  Allergies: Allergies  Allergen Reactions   Corticosteroids Shortness Of Breath    HICCUPS AND RESPIRATORY DISTRESS   Methylprednisolone  Shortness Of Breath    Pt tolerates po steroids   Other Anaphylaxis    IV steroid   Penicillins Rash and Other (See Comments)        Avocado     Other reaction(s): Other (See Comments) ORAL PEELING   Macrobid [Nitrofurantoin]     Rash    Cortisone     Hiccups for a week    PMH: Past Medical History:  Diagnosis Date   Anemia    Atrial fibrillation (HCC)    Cancer (HCC)    Complication of anesthesia    Small airway, vocal cord damaged   Hypertension    Pneumonia    TIA (transient ischemic attack)    Whooping cough     PSH: Past Surgical History:  Procedure Laterality Date   BLADDER SURGERY     CHOLECYSTECTOMY     CYSTOSCOPY W/ RETROGRADES Bilateral 07/01/2023   Procedure: CYSTOSCOPY WITH RETROGRADE PYELOGRAM;  Surgeon: Shona Layman BROCKS, MD;  Location: [facility];  Service: Urology;  Laterality: Bilateral;   EYE SURGERY     HERNIA REPAIR     TONSILLECTOMY      SH: Social History   Tobacco Use   Smoking status: Never   Smokeless tobacco: Never  Vaping Use   Vaping status: Never Used  Substance Use Topics   Alcohol use: Yes    Alcohol/week: 2.0 standard  drinks of alcohol    Types: 2 Glasses of wine per week    Comment: Wine   Drug use: No    ROS: Constitutional:  Negative for fever, chills, weight loss CV: Negative for chest pain, previous MI, hypertension Respiratory:  Negative for shortness of breath, wheezing, sleep apnea, frequent cough GI:  Negative for nausea, vomiting, bloody stool, GERD  PE: GENERAL APPEARANCE:  Well appearing, well developed, well nourished, NAD  Results: Results for orders placed or performed in visit on 05/07/24 (from the past 24 hours)  Urinalysis,  Routine w reflex microscopic     Status: Abnormal   Collection Time: 05/07/24 12:00 AM  Result Value Ref Range   Specific Gravity, UA 1.010 1.005 - 1.030   pH, UA 5.5 5.0 - 7.5   Color, UA Yellow Yellow   Appearance Ur Clear Clear   Leukocytes,UA Trace (A) Negative   Protein,UA 1+ (A) Negative/Trace   Glucose, UA Negative Negative   Ketones, UA Negative Negative   RBC, UA Negative Negative   Bilirubin, UA Negative Negative   Urobilinogen, Ur 0.2 0.2 - 1.0 mg/dL   Nitrite, UA Negative Negative   Microscopic Examination See below:    Narrative   Performed at:  01 - [facility]  Urology [facility] [address], [place], [place]  [id number] Lab Director: Greig Chang MT, Phone:  [phone]  Microscopic Examination     Status: Abnormal   Collection Time: 05/07/24 12:00 AM   Urine  Result Value Ref Range   WBC, UA 0-5 0 - 5 /hpf   RBC, Urine 0-2 0 - 2 /hpf   Epithelial Cells (non renal) 0-10 0 - 10 /hpf   Casts Present (A) None seen /lpf   Cast Type Hyaline casts N/A   Bacteria, UA Few None seen/Few   Narrative   Performed at:  01 [facility]  Urology [facility] [address], [place], [place]  [id number] Lab Director: Greig Chang MT, Phone:  [phone]

## 2024-05-07 NOTE — Progress Notes (Signed)
 BCG Bladder Instillation  mBCG # 3 of 3  Due to Bladder Cancer patient is present today for a BCG treatment. Patient was cleaned and prepped in a sterile fashion with betadine. A 14FR coude catheter was inserted, urine return was noted 20mL, urine was yellow in color.  50ml of reconstituted BCG was instilled into the bladder. The catheter was then removed. Patient tolerated well, no complications were noted.  Performed by: Roselee CMA & Chelsea LPN   Follow up/ Additional notes: RTC in 6-8 weeks for cystoscopy.

## 2024-07-01 ENCOUNTER — Encounter: Payer: Self-pay | Admitting: Urology

## 2024-07-01 ENCOUNTER — Ambulatory Visit: Admitting: Urology

## 2024-07-01 VITALS — BP 150/70 | HR 83 | Ht 63.0 in | Wt 150.0 lb

## 2024-07-01 DIAGNOSIS — C679 Malignant neoplasm of bladder, unspecified: Secondary | ICD-10-CM

## 2024-07-01 LAB — URINALYSIS, ROUTINE W REFLEX MICROSCOPIC
Glucose, UA: NEGATIVE
Leukocytes,UA: NEGATIVE
Nitrite, UA: NEGATIVE
Specific Gravity, UA: 1.025 (ref 1.005–1.030)
Urobilinogen, Ur: 0.2 mg/dL (ref 0.2–1.0)
pH, UA: 5 (ref 5.0–7.5)

## 2024-07-01 LAB — MICROSCOPIC EXAMINATION

## 2024-07-01 MED ORDER — CIPROFLOXACIN HCL 500 MG PO TABS
500.0000 mg | ORAL_TABLET | Freq: Once | ORAL | Status: AC
  Administered 2024-07-01: 500 mg via ORAL

## 2024-07-01 NOTE — Progress Notes (Signed)
 Assessment: 1. Malignant neoplasm of urinary bladder:  high grade, Ta - dxd 11/24; on maintenance BCG     Plan: Urine cytology sent today. Cipro  x 1 following cystoscopy. Return to office for maintenance BCG in February/March 2026 Will need surveillance cystoscopy after next BCG  Chief Complaint: Bladder cancer  HPI: Stephen Hendrix is a 89 y.o. male who presents for continued evaluation of high risk nonmuscle of bladder cancer.   He underwent cystoscopy with TURBT by Dr. Shona in November 2024.  At that time he had 2 small low-grade appearing papillary tumors distal and to the right of the right UO and heaped up mucosa inside the bladder neck. 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 He completed induction BCG in January 2025. Surveillance cystoscopy in March 2025  showed no endoscopic evidence of recurrence.  His cytology however did show dysplasia and a positive FISH.   Clinically he has done very well.    He completed maintenance BCG therapy #3/3 on 01/07/24. He was doing well.  No new lower urinary tract symptoms.  No dysuria or gross hematuria.  Surveillance cystoscopy from 02/24/2024 showed no recurrence within the bladder. Urine cytology was negative for malignant cells.  He presented for BCG on 04/06/2024.  His urinalysis showed significant pyuria.  Urine culture was sent which grew 50-100 K mixed flora.  He was treated with Bactrim  x 7 days. Urinalysis from 04/28/2024 showed >30 WBCs, 3-10 RBCs, and positive nitrite. [facility]X urine culture grew Enterococcus and Staphylococcus, sensitive only to Macrodantin and Zyvox .  Due to his allergy to Macrodantin, he was placed on Zyvox .  Unfortunately, he began to experience significant diarrhea which he associated with the antibiotics.  He was seen in the emergency room yesterday for the symptoms. Urinalysis from 05/05/2024 showed 0-5 RBCs, 6-10 WBCs, rare bacteria,  nitrite negative. Urine culture showed no growth. He completed 3 weeks of maintenance BCG on 05/07/2024.  He presents today for surveillance cystoscopy. He is doing well.  No lower urinary tract symptoms.  No dysuria or gross hematuria.  Portions of the above documentation were copied from a prior visit for review purposes only.  Allergies: Allergies  Allergen Reactions   Corticosteroids Shortness Of Breath    HICCUPS AND RESPIRATORY DISTRESS   Methylprednisolone  Shortness Of Breath    Pt tolerates po steroids   Other Anaphylaxis    IV steroid   Penicillins Rash and Other (See Comments)        Avocado     Other reaction(s): Other (See Comments) ORAL PEELING   Macrobid [Nitrofurantoin]     Rash    Cortisone     Hiccups for a week    PMH: Past Medical History:  Diagnosis Date   Anemia    Atrial fibrillation (HCC)    Cancer (HCC)    Complication of anesthesia    Small airway, vocal cord damaged   Hypertension    Pneumonia    TIA (transient ischemic attack)    Whooping cough     PSH: Past Surgical History:  Procedure Laterality Date   BLADDER SURGERY     CHOLECYSTECTOMY     CYSTOSCOPY W/ RETROGRADES Bilateral 07/01/2023   Procedure: CYSTOSCOPY WITH RETROGRADE PYELOGRAM;  Surgeon: Shona Layman BROCKS, MD;  Location: [facility];  Service: Urology;  Laterality: Bilateral;   EYE SURGERY     HERNIA REPAIR     TONSILLECTOMY      SH: Social History   Tobacco Use  Smoking status: Never   Smokeless tobacco: Never  Vaping Use   Vaping status: Never Used  Substance Use Topics   Alcohol use: Yes    Alcohol/week: 2.0 standard drinks of alcohol    Types: 2 Glasses of wine per week    Comment: Wine   Drug use: No    ROS: Constitutional:  Negative for fever, chills, weight loss CV: Negative for chest pain, previous MI, hypertension Respiratory:  Negative for shortness of breath, wheezing, sleep apnea, frequent cough GI:  Negative for nausea, vomiting, bloody stool,  GERD  PE: BP (!) 150/70   Pulse 83   Ht 5' 3 (1.6 m)   Wt 150 lb (68 kg)   BMI 26.57 kg/m  GENERAL APPEARANCE:  Well appearing, well developed, well nourished, NAD HEENT:  Atraumatic, normocephalic, oropharynx clear NECK:  Supple without lymphadenopathy or thyromegaly ABDOMEN:  Soft, non-tender, no masses EXTREMITIES:  Moves all extremities well, without clubbing, cyanosis, or edema NEUROLOGIC:  Alert and oriented x 3, normal gait, CN II-XII grossly intact MENTAL STATUS:  appropriate BACK:  Non-tender to palpation, No CVAT SKIN:  Warm, dry, and intact  Results: U/A:  0-5 WBC, 3-10 RBC  Procedure:  Flexible Cystourethroscopy  Pre-operative Diagnosis: Bladder cancer surveillance  Post-operative Diagnosis: Bladder cancer surveillance  Anesthesia:  local with lidocaine  jelly  Surgical Narrative:  After appropriate informed consent was obtained, the patient was prepped and draped in the usual sterile fashion in the supine position.  The patient was correctly identified and the proper procedure delineated prior to proceeding.  Sterile lidocaine  gel was instilled in the urethra. The flexible cystoscope was introduced without difficulty.  Findings:  Anterior urethra: Normal  Posterior urethra: Lateral lobe hypertrophy  Bladder: Trabeculations and cellules; no evidence of mucosal abnormalities  Ureteral orifices: normal  Additional findings: None  Saline bladder wash for cytology was performed.    The cystoscope was then removed.  The patient tolerated the procedure well.

## 2024-07-07 ENCOUNTER — Ambulatory Visit: Payer: Self-pay | Admitting: Urology

## 2024-07-07 LAB — UROV MD:CYTOLOGY W/REFLEX FISH ATYPICAL CYTOLOGY

## 2024-07-08 ENCOUNTER — Encounter: Payer: Self-pay | Admitting: Urology

## 2024-11-11 ENCOUNTER — Ambulatory Visit: Admitting: Urology
